# Patient Record
Sex: Male | Born: 1935 | Race: White | Hispanic: No | Marital: Married | Smoking: Never smoker
Health system: Southern US, Community
[De-identification: ages and names within clinical notes are randomized; demographics above are authoritative.]

## PROBLEM LIST (undated history)

## (undated) HISTORY — PX: FINGER SURGERY: SHX640

## (undated) HISTORY — PX: APPENDECTOMY: SHX54

---

## 2019-09-24 ENCOUNTER — Encounter: Payer: Self-pay | Admitting: *Deleted

## 2019-09-24 ENCOUNTER — Ambulatory Visit
Admission: EM | Admit: 2019-09-24 | Discharge: 2019-09-24 | Disposition: A | Discharge status: Home / Self Care | Payer: Medicare Other | Attending: Emergency Medicine | Admitting: Emergency Medicine

## 2019-09-24 ENCOUNTER — Other Ambulatory Visit: Payer: Self-pay

## 2019-09-24 DIAGNOSIS — Z20822 Contact with and (suspected) exposure to covid-19: Secondary | ICD-10-CM

## 2019-09-24 DIAGNOSIS — R0981 Nasal congestion: Secondary | ICD-10-CM

## 2019-09-24 LAB — POC SARS CORONAVIRUS 2 AG -  ED: SARS Coronavirus 2 Ag: NEGATIVE

## 2019-09-24 MED ORDER — FLUTICASONE PROPIONATE 50 MCG/ACT NA SUSP
1.0000 | Freq: Every day | NASAL | 0 refills | Status: AC
Start: 2019-09-24 — End: ?

## 2019-09-24 NOTE — ED Triage Notes (Signed)
C/O sinus burning, nasal congestion, slight cough x 2 days without fever.  Denies malaise.

## 2019-09-24 NOTE — Discharge Instructions (Addendum)
Important to follow-up with your PCP about your elevated blood pressure today. Please go to ER if you develop severe chest pain, difficulty breathing, lightheadedness, nausea, leg swelling. Recommend taking Coricidin as this is a decongestant that will not elevate your blood pressure.

## 2019-09-24 NOTE — ED Provider Notes (Signed)
[facility]    CSN: [id number] Arrival date & time: 09/24/19  0920      History   Chief Complaint Chief Complaint  Patient presents with  . Nasal Congestion  . Cough    HPI Calvin Yates is a 85 y.o. male without significant medical history   Presenting for Covid testing: Exposure: unknown Date of exposure: Thursday at dentist office Any fever, symptoms since exposure: Yes: Sinus congestion, burning, dry cough x2 days.  No fever, chest pain, difficulty breathing. Of note, patient's blood pressure elevated: Denies history thereof.  Has been taking OTC decongestants.  Denies chest pain, shortness of breath, headache, abdominal pain, lower extremity edema.  History reviewed. No pertinent past medical history.  There are no problems to display for this patient.   Past Surgical History:  Procedure Laterality Date  . APPENDECTOMY    . FINGER SURGERY         Home Medications    Prior to Admission medications   Medication Sig Start Date End Date Taking? Authorizing Provider  fluticasone (FLONASE) 50 MCG/ACT nasal spray Place 1 spray into both nostrils daily. 09/24/19   Hall-Potvin, [place], PA-C    Family History Family History  Problem Relation Age of Onset  . Hypertension Mother   . Hypercholesterolemia Mother   . Heart disease Mother     Social History Social History   Tobacco Use  . Smoking status: Never Smoker  . Smokeless tobacco: Never Used  Substance Use Topics  . Alcohol use: Not Currently  . Drug use: Never     Allergies   Amoxicillin   Review of Systems As per HPI   Physical Exam Triage Vital Signs ED Triage Vitals  Enc Vitals Group     BP      Pulse      Resp      Temp      Temp src      SpO2      Weight      Height      Head Circumference      Peak Flow      Pain Score      Pain Loc      Pain Edu?      Excl. in GC?    No data found.  Updated Vital Signs BP (!) 198/100 (BP Location: Right Arm)   Pulse  89   Temp 98.6 F (37 C) (Oral)   Resp (!) 22   SpO2 94%   Visual Acuity Right Eye Distance:   Left Eye Distance:   Bilateral Distance:    Right Eye Near:   Left Eye Near:    Bilateral Near:     Physical Exam Constitutional:      General: He is not in acute distress.    Appearance: He is not toxic-appearing or diaphoretic.  HENT:     Head: Normocephalic and atraumatic.     Mouth/Throat:     Mouth: Mucous membranes are moist.     Pharynx: Oropharynx is clear.  Eyes:     General: No scleral icterus.    Conjunctiva/sclera: Conjunctivae normal.     Pupils: Pupils are equal, round, and reactive to light.  Neck:     Comments: Trachea midline, negative JVD Cardiovascular:     Rate and Rhythm: Normal rate and regular rhythm.  Pulmonary:     Effort: Pulmonary effort is normal. No respiratory distress.     Breath sounds: No wheezing.  Musculoskeletal:  Cervical back: Neck supple. No tenderness.  Lymphadenopathy:     Cervical: No cervical adenopathy.  Skin:    Capillary Refill: Capillary refill takes less than 2 seconds.     Coloration: Skin is not jaundiced or pale.     Findings: No rash.  Neurological:     Mental Status: He is alert and oriented to person, place, and time.      UC Treatments / Results  Labs (all labs ordered are listed, but only abnormal results are displayed) Labs Reviewed  POC SARS CORONAVIRUS 2 AG -  ED - Normal  NOVEL CORONAVIRUS, NAA    EKG   Radiology No results found.  Procedures Procedures (including critical care time)  Medications Ordered in UC Medications - No data to display  Initial Impression / Assessment and Plan / UC Course  I have reviewed the triage vital signs and the nursing notes.  Pertinent labs & imaging results that were available during my care of the patient were reviewed by me and considered in my medical decision making (see chart for details).     Patient afebrile, nontoxic, with SpO2 94%.  Rapid Covid  negative, Covid PCR pending.  Patient to quarantine until results are back.  We will treat supportively as outlined below.  Patient will follow up with PCP regarding elevated blood pressure reading.  Return precautions discussed, patient verbalized understanding and is agreeable to plan. Final Clinical Impressions(s) / UC Diagnoses   Final diagnoses:  Exposure to COVID-19 virus  Nasal congestion     Discharge Instructions     Important to follow-up with your PCP about your elevated blood pressure today. Please go to ER if you develop severe chest pain, difficulty breathing, lightheadedness, nausea, leg swelling. Recommend taking Coricidin as this is a decongestant that will not elevate your blood pressure.    ED Prescriptions    Medication Sig Dispense Auth. Provider   fluticasone (FLONASE) 50 MCG/ACT nasal spray Place 1 spray into both nostrils daily. 16 g Hall-Potvin, [place], PA-C     PDMP not reviewed this encounter.   Hall-Potvin, [place], [place] 09/24/19 1032

## 2019-09-25 LAB — SARS-COV-2, NAA 2 DAY TAT

## 2019-09-25 LAB — NOVEL CORONAVIRUS, NAA: SARS-CoV-2, NAA: NOT DETECTED

## 2020-06-15 ENCOUNTER — Other Ambulatory Visit: Payer: Self-pay

## 2020-06-15 ENCOUNTER — Inpatient Hospital Stay (HOSPITAL_COMMUNITY)
Admission: EM | Admit: 2020-06-15 | Discharge: 2020-07-05 | DRG: 064 | Disposition: A | Discharge status: Hospice | Payer: Medicare Other | Attending: Internal Medicine | Admitting: Internal Medicine

## 2020-06-15 ENCOUNTER — Inpatient Hospital Stay (HOSPITAL_COMMUNITY): Payer: Medicare Other

## 2020-06-15 ENCOUNTER — Emergency Department (HOSPITAL_COMMUNITY): Payer: Medicare Other

## 2020-06-15 DIAGNOSIS — R29720 NIHSS score 20: Secondary | ICD-10-CM | POA: Diagnosis present

## 2020-06-15 DIAGNOSIS — R414 Neurologic neglect syndrome: Secondary | ICD-10-CM | POA: Diagnosis present

## 2020-06-15 DIAGNOSIS — R531 Weakness: Secondary | ICD-10-CM | POA: Diagnosis not present

## 2020-06-15 DIAGNOSIS — G9340 Encephalopathy, unspecified: Secondary | ICD-10-CM | POA: Diagnosis not present

## 2020-06-15 DIAGNOSIS — I619 Nontraumatic intracerebral hemorrhage, unspecified: Secondary | ICD-10-CM | POA: Diagnosis present

## 2020-06-15 DIAGNOSIS — R0902 Hypoxemia: Secondary | ICD-10-CM

## 2020-06-15 DIAGNOSIS — G936 Cerebral edema: Secondary | ICD-10-CM | POA: Diagnosis present

## 2020-06-15 DIAGNOSIS — R509 Fever, unspecified: Secondary | ICD-10-CM

## 2020-06-15 DIAGNOSIS — Z79899 Other long term (current) drug therapy: Secondary | ICD-10-CM

## 2020-06-15 DIAGNOSIS — Z880 Allergy status to penicillin: Secondary | ICD-10-CM

## 2020-06-15 DIAGNOSIS — G8194 Hemiplegia, unspecified affecting left nondominant side: Secondary | ICD-10-CM | POA: Diagnosis present

## 2020-06-15 DIAGNOSIS — N179 Acute kidney failure, unspecified: Secondary | ICD-10-CM | POA: Diagnosis not present

## 2020-06-15 DIAGNOSIS — R4701 Aphasia: Secondary | ICD-10-CM | POA: Diagnosis present

## 2020-06-15 DIAGNOSIS — Z6828 Body mass index (BMI) 28.0-28.9, adult: Secondary | ICD-10-CM

## 2020-06-15 DIAGNOSIS — R609 Edema, unspecified: Secondary | ICD-10-CM | POA: Diagnosis not present

## 2020-06-15 DIAGNOSIS — R069 Unspecified abnormalities of breathing: Secondary | ICD-10-CM

## 2020-06-15 DIAGNOSIS — R29810 Facial weakness: Secondary | ICD-10-CM | POA: Diagnosis present

## 2020-06-15 DIAGNOSIS — I10 Essential (primary) hypertension: Secondary | ICD-10-CM | POA: Diagnosis present

## 2020-06-15 DIAGNOSIS — E663 Overweight: Secondary | ICD-10-CM | POA: Diagnosis present

## 2020-06-15 DIAGNOSIS — J690 Pneumonitis due to inhalation of food and vomit: Secondary | ICD-10-CM | POA: Diagnosis present

## 2020-06-15 DIAGNOSIS — I161 Hypertensive emergency: Secondary | ICD-10-CM | POA: Diagnosis present

## 2020-06-15 DIAGNOSIS — R1310 Dysphagia, unspecified: Secondary | ICD-10-CM | POA: Diagnosis present

## 2020-06-15 DIAGNOSIS — Z515 Encounter for palliative care: Secondary | ICD-10-CM

## 2020-06-15 DIAGNOSIS — J9601 Acute respiratory failure with hypoxia: Secondary | ICD-10-CM | POA: Diagnosis not present

## 2020-06-15 DIAGNOSIS — R739 Hyperglycemia, unspecified: Secondary | ICD-10-CM | POA: Diagnosis not present

## 2020-06-15 DIAGNOSIS — T17908A Unspecified foreign body in respiratory tract, part unspecified causing other injury, initial encounter: Secondary | ICD-10-CM | POA: Diagnosis not present

## 2020-06-15 DIAGNOSIS — Z66 Do not resuscitate: Secondary | ICD-10-CM | POA: Diagnosis not present

## 2020-06-15 DIAGNOSIS — Z20822 Contact with and (suspected) exposure to covid-19: Secondary | ICD-10-CM | POA: Diagnosis present

## 2020-06-15 DIAGNOSIS — E785 Hyperlipidemia, unspecified: Secondary | ICD-10-CM | POA: Diagnosis not present

## 2020-06-15 DIAGNOSIS — M7989 Other specified soft tissue disorders: Secondary | ICD-10-CM | POA: Diagnosis not present

## 2020-06-15 DIAGNOSIS — I680 Cerebral amyloid angiopathy: Secondary | ICD-10-CM | POA: Diagnosis present

## 2020-06-15 DIAGNOSIS — T1490XA Injury, unspecified, initial encounter: Secondary | ICD-10-CM

## 2020-06-15 DIAGNOSIS — I6389 Other cerebral infarction: Secondary | ICD-10-CM | POA: Diagnosis not present

## 2020-06-15 DIAGNOSIS — D6489 Other specified anemias: Secondary | ICD-10-CM | POA: Diagnosis not present

## 2020-06-15 DIAGNOSIS — G9349 Other encephalopathy: Secondary | ICD-10-CM | POA: Diagnosis present

## 2020-06-15 DIAGNOSIS — Z7189 Other specified counseling: Secondary | ICD-10-CM | POA: Diagnosis not present

## 2020-06-15 DIAGNOSIS — Z789 Other specified health status: Secondary | ICD-10-CM

## 2020-06-15 DIAGNOSIS — E854 Organ-limited amyloidosis: Secondary | ICD-10-CM | POA: Diagnosis present

## 2020-06-15 DIAGNOSIS — T17908D Unspecified foreign body in respiratory tract, part unspecified causing other injury, subsequent encounter: Secondary | ICD-10-CM | POA: Diagnosis not present

## 2020-06-15 DIAGNOSIS — Z83438 Family history of other disorder of lipoprotein metabolism and other lipidemia: Secondary | ICD-10-CM

## 2020-06-15 DIAGNOSIS — I611 Nontraumatic intracerebral hemorrhage in hemisphere, cortical: Principal | ICD-10-CM | POA: Diagnosis present

## 2020-06-15 DIAGNOSIS — E870 Hyperosmolality and hypernatremia: Secondary | ICD-10-CM | POA: Diagnosis not present

## 2020-06-15 DIAGNOSIS — A411 Sepsis due to other specified staphylococcus: Secondary | ICD-10-CM | POA: Diagnosis not present

## 2020-06-15 DIAGNOSIS — D72829 Elevated white blood cell count, unspecified: Secondary | ICD-10-CM | POA: Diagnosis not present

## 2020-06-15 DIAGNOSIS — J9602 Acute respiratory failure with hypercapnia: Secondary | ICD-10-CM | POA: Diagnosis not present

## 2020-06-15 DIAGNOSIS — G935 Compression of brain: Secondary | ICD-10-CM | POA: Diagnosis present

## 2020-06-15 DIAGNOSIS — R471 Dysarthria and anarthria: Secondary | ICD-10-CM | POA: Diagnosis present

## 2020-06-15 DIAGNOSIS — I609 Nontraumatic subarachnoid hemorrhage, unspecified: Secondary | ICD-10-CM | POA: Diagnosis present

## 2020-06-15 DIAGNOSIS — B957 Other staphylococcus as the cause of diseases classified elsewhere: Secondary | ICD-10-CM | POA: Diagnosis not present

## 2020-06-15 DIAGNOSIS — I610 Nontraumatic intracerebral hemorrhage in hemisphere, subcortical: Secondary | ICD-10-CM | POA: Diagnosis not present

## 2020-06-15 DIAGNOSIS — R7881 Bacteremia: Secondary | ICD-10-CM | POA: Diagnosis not present

## 2020-06-15 DIAGNOSIS — Z9911 Dependence on respirator [ventilator] status: Secondary | ICD-10-CM | POA: Diagnosis not present

## 2020-06-15 DIAGNOSIS — Z8249 Family history of ischemic heart disease and other diseases of the circulatory system: Secondary | ICD-10-CM

## 2020-06-15 LAB — RAPID URINE DRUG SCREEN, HOSP PERFORMED
Amphetamines: NOT DETECTED
Barbiturates: NOT DETECTED
Benzodiazepines: NOT DETECTED
Cocaine: NOT DETECTED
Opiates: NOT DETECTED
Tetrahydrocannabinol: NOT DETECTED

## 2020-06-15 LAB — DIFFERENTIAL
Abs Immature Granulocytes: 0.06 10*3/uL (ref 0.00–0.07)
Basophils Absolute: 0 10*3/uL (ref 0.0–0.1)
Basophils Relative: 0 %
Eosinophils Absolute: 0.1 10*3/uL (ref 0.0–0.5)
Eosinophils Relative: 1 %
Immature Granulocytes: 1 %
Lymphocytes Relative: 30 %
Lymphs Abs: 3.1 10*3/uL (ref 0.7–4.0)
Monocytes Absolute: 0.9 10*3/uL (ref 0.1–1.0)
Monocytes Relative: 9 %
Neutro Abs: 6.2 10*3/uL (ref 1.7–7.7)
Neutrophils Relative %: 59 %

## 2020-06-15 LAB — COMPREHENSIVE METABOLIC PANEL
ALT: 23 U/L (ref 0–44)
AST: 24 U/L (ref 15–41)
Albumin: 4.1 g/dL (ref 3.5–5.0)
Alkaline Phosphatase: 44 U/L (ref 38–126)
Anion gap: 11 (ref 5–15)
BUN: 18 mg/dL (ref 8–23)
CO2: 22 mmol/L (ref 22–32)
Calcium: 8.9 mg/dL (ref 8.9–10.3)
Chloride: 103 mmol/L (ref 98–111)
Creatinine, Ser: 1.12 mg/dL (ref 0.61–1.24)
GFR, Estimated: >60 mL/min (ref >60)
Glucose, Bld: 141 mg/dL — ABNORMAL HIGH (ref 70–99)
Potassium: 3.9 mmol/L (ref 3.5–5.1)
Sodium: 136 mmol/L (ref 135–145)
Total Bilirubin: 0.8 mg/dL (ref 0.3–1.2)
Total Protein: 7 g/dL (ref 6.5–8.1)

## 2020-06-15 LAB — CBC
HCT: 39.7 % (ref 39.0–52.0)
Hemoglobin: 13 g/dL (ref 13.0–17.0)
MCH: 31.8 pg (ref 26.0–34.0)
MCHC: 32.7 g/dL (ref 30.0–36.0)
MCV: 97.1 fL (ref 80.0–100.0)
Platelets: 228 10*3/uL (ref 150–400)
RBC: 4.09 MIL/uL — ABNORMAL LOW (ref 4.22–5.81)
RDW: 12.2 % (ref 11.5–15.5)
WBC: 10.4 10*3/uL (ref 4.0–10.5)
nRBC: 0 % (ref 0.0–0.2)

## 2020-06-15 LAB — URINALYSIS, ROUTINE W REFLEX MICROSCOPIC
Bacteria, UA: NONE SEEN
Bilirubin Urine: NEGATIVE
Glucose, UA: NEGATIVE mg/dL
Hgb urine dipstick: NEGATIVE
Ketones, ur: NEGATIVE mg/dL
Nitrite: NEGATIVE
Protein, ur: NEGATIVE mg/dL
Specific Gravity, Urine: 1.015 (ref 1.005–1.030)
pH: 6 (ref 5.0–8.0)

## 2020-06-15 LAB — HEMOGLOBIN A1C
Hgb A1c MFr Bld: 5.6 % (ref 4.8–5.6)
Mean Plasma Glucose: 114.02 mg/dL

## 2020-06-15 LAB — RESP PANEL BY RT-PCR (FLU A&B, COVID) ARPGX2
Influenza A by PCR: NEGATIVE
Influenza B by PCR: NEGATIVE
SARS Coronavirus 2 by RT PCR: NEGATIVE

## 2020-06-15 LAB — I-STAT CHEM 8, ED
BUN: 20 mg/dL (ref 8–23)
Calcium, Ion: 1.11 mmol/L — ABNORMAL LOW (ref 1.15–1.40)
Chloride: 104 mmol/L (ref 98–111)
Creatinine, Ser: 1 mg/dL (ref 0.61–1.24)
Glucose, Bld: 137 mg/dL — ABNORMAL HIGH (ref 70–99)
HCT: 40 % (ref 39.0–52.0)
Hemoglobin: 13.6 g/dL (ref 13.0–17.0)
Potassium: 4 mmol/L (ref 3.5–5.1)
Sodium: 139 mmol/L (ref 135–145)
TCO2: 23 mmol/L (ref 22–32)

## 2020-06-15 LAB — APTT: aPTT: 26 seconds (ref 24–36)

## 2020-06-15 LAB — PROTIME-INR
INR: 1 (ref 0.8–1.2)
Prothrombin Time: 13 seconds (ref 11.4–15.2)

## 2020-06-15 LAB — CBG MONITORING, ED: Glucose-Capillary: 129 mg/dL — ABNORMAL HIGH (ref 70–99)

## 2020-06-15 LAB — GLUCOSE, CAPILLARY: Glucose-Capillary: 153 mg/dL — ABNORMAL HIGH (ref 70–99)

## 2020-06-15 MED ORDER — SODIUM CHLORIDE 0.9% FLUSH
3.0000 mL | Freq: Once | INTRAVENOUS | Status: AC
  Administered 2020-06-15: 3 mL via INTRAVENOUS

## 2020-06-15 MED ORDER — CLEVIDIPINE BUTYRATE 0.5 MG/ML IV EMUL
0.0000 mg/h | INTRAVENOUS | Status: DC
  Administered 2020-06-15: 14 mg/h via INTRAVENOUS
  Administered 2020-06-16: 19 mg/h via INTRAVENOUS
  Administered 2020-06-16: 18 mg/h via INTRAVENOUS
  Administered 2020-06-16: 27 mg/h via INTRAVENOUS
  Administered 2020-06-16: 18 mg/h via INTRAVENOUS
  Administered 2020-06-16: 27 mg/h via INTRAVENOUS
  Administered 2020-06-16: 26 mg/h via INTRAVENOUS
  Administered 2020-06-16: 27 mg/h via INTRAVENOUS
  Administered 2020-06-16: 18 mg/h via INTRAVENOUS
  Administered 2020-06-16 (×2): 26 mg/h via INTRAVENOUS
  Administered 2020-06-17: 27 mg/h via INTRAVENOUS
  Administered 2020-06-17 (×2): 26 mg/h via INTRAVENOUS
  Administered 2020-06-17: 27 mg/h via INTRAVENOUS
  Administered 2020-06-17: 32 mg/h via INTRAVENOUS
  Administered 2020-06-17 (×5): 27 mg/h via INTRAVENOUS
  Administered 2020-06-17 – 2020-06-18 (×7): 32 mg/h via INTRAVENOUS
  Administered 2020-06-18: 24 mg/h via INTRAVENOUS
  Filled 2020-06-15: qty 100
  Filled 2020-06-15 (×2): qty 50
  Filled 2020-06-15: qty 100
  Filled 2020-06-15 (×3): qty 50
  Filled 2020-06-15 (×3): qty 100
  Filled 2020-06-15 (×3): qty 50
  Filled 2020-06-15: qty 100
  Filled 2020-06-15 (×3): qty 50
  Filled 2020-06-15 (×2): qty 100
  Filled 2020-06-15 (×3): qty 50
  Filled 2020-06-15: qty 100

## 2020-06-15 MED ORDER — ACETAMINOPHEN 160 MG/5ML PO SOLN
650.0000 mg | ORAL | Status: DC | PRN
  Administered 2020-06-18 – 2020-07-02 (×9): 650 mg
  Filled 2020-06-15 (×8): qty 20.3

## 2020-06-15 MED ORDER — STROKE: EARLY STAGES OF RECOVERY BOOK
Freq: Once | Status: AC
  Filled 2020-06-15: qty 1

## 2020-06-15 MED ORDER — ACETAMINOPHEN 650 MG RE SUPP
650.0000 mg | RECTAL | Status: DC | PRN
  Administered 2020-06-16 – 2020-06-17 (×2): 650 mg via RECTAL
  Filled 2020-06-15 (×2): qty 1

## 2020-06-15 MED ORDER — INSULIN ASPART 100 UNIT/ML ~~LOC~~ SOLN
0.0000 [IU] | SUBCUTANEOUS | Status: DC
  Administered 2020-06-16 – 2020-06-25 (×11): 1 [IU] via SUBCUTANEOUS

## 2020-06-15 MED ORDER — SENNOSIDES-DOCUSATE SODIUM 8.6-50 MG PO TABS
1.0000 | ORAL_TABLET | Freq: Two times a day (BID) | ORAL | Status: DC
  Filled 2020-06-15: qty 1

## 2020-06-15 MED ORDER — PANTOPRAZOLE SODIUM 40 MG IV SOLR
40.0000 mg | Freq: Every day | INTRAVENOUS | Status: DC
  Administered 2020-06-16 – 2020-06-17 (×2): 40 mg via INTRAVENOUS
  Filled 2020-06-15 (×2): qty 40

## 2020-06-15 MED ORDER — CHLORHEXIDINE GLUCONATE CLOTH 2 % EX PADS
6.0000 | MEDICATED_PAD | Freq: Every day | CUTANEOUS | Status: DC
  Administered 2020-06-16 – 2020-07-03 (×18): 6 via TOPICAL

## 2020-06-15 MED ORDER — CLEVIDIPINE BUTYRATE 0.5 MG/ML IV EMUL
INTRAVENOUS | Status: AC
  Administered 2020-06-15: 4 mg/h via INTRAVENOUS
  Filled 2020-06-15: qty 50

## 2020-06-15 MED ORDER — ACETAMINOPHEN 325 MG PO TABS
650.0000 mg | ORAL_TABLET | ORAL | Status: DC | PRN
  Administered 2020-06-24: 650 mg via ORAL
  Filled 2020-06-15 (×2): qty 2

## 2020-06-15 MED ORDER — ONDANSETRON HCL 4 MG/2ML IJ SOLN
4.0000 mg | Freq: Four times a day (QID) | INTRAMUSCULAR | Status: DC | PRN
  Administered 2020-06-16: 4 mg via INTRAVENOUS
  Filled 2020-06-15: qty 2

## 2020-06-15 MED ORDER — LABETALOL HCL 5 MG/ML IV SOLN
10.0000 mg | Freq: Once | INTRAVENOUS | Status: AC
  Administered 2020-06-15: 10 mg via INTRAVENOUS

## 2020-06-15 MED ORDER — CHLORHEXIDINE GLUCONATE 0.12 % MT SOLN
15.0000 mL | Freq: Two times a day (BID) | OROMUCOSAL | Status: DC
  Administered 2020-06-16 – 2020-06-21 (×12): 15 mL via OROMUCOSAL
  Filled 2020-06-15 (×7): qty 15

## 2020-06-15 MED ORDER — GADOBUTROL 1 MMOL/ML IV SOLN
7.5000 mL | Freq: Once | INTRAVENOUS | Status: AC | PRN
  Administered 2020-06-15: 7.5 mL via INTRAVENOUS

## 2020-06-15 NOTE — Consult Note (Addendum)
NAME:  Calvin Yates, MRN:  [id number], DOB:  05-17-1934, LOS: 0 ADMISSION DATE:  06/15/2020, CONSULTATION DATE:  06/15/20 REFERRING MD:  Dr Iver Nestle - neuro, CHIEF COMPLAINT:   - Acute encephalopathy due to ICH  Brief History:  Acute encephalopathy due to ICH  History of Present Illness: Obtained from [facility] hospitalist.  Patient unable to give history  86 year old male with very minimal past medical history.  Ate dinner with his wife at 1730 and having a conversation till 1800 hrs.  Abruptly at 1815 was found to be down.  EMS on arrival found his blood pressure to be high systolic 200s.  He had an episode of emesis on route and transiently hypoxemic.  But in the ER for the 90 minutes prior to CCM evaluation saturation 95% on room air and no further emesis.  He is drowsy but following commands with hemiparesis on the left side.  7 cm right intracranial hemorrhage frontoparietal region hemorrhage without midline shift.  Patient's not intubated.  He has a hard collar on because of his fall.  C-spine CT is pending.  CCM called because of high risk of worsening encephalopathy and respiratory failure  Past Medical History:    has no past medical history on file.   reports that he has never smoked. He has never used smokeless tobacco.  Past Surgical History:  Procedure Laterality Date  . APPENDECTOMY    . FINGER SURGERY      Allergies  Allergen Reactions  . Amoxicillin Rash     There is no immunization history on file for this patient.  Family History  Problem Relation Age of Onset  . Hypertension Mother   . Hypercholesterolemia Mother   . Heart disease Mother      Current Facility-Administered Medications:  .  clevidipine (CLEVIPREX) infusion 0.5 mg/mL, 0-21 mg/hr, Intravenous, Continuous, Bhagat, Srishti L, MD, Last Rate: 8 mL/hr at 06/15/20 1957, 4 mg/hr at 06/15/20 1957  Current Outpatient Medications:  .  fluticasone (FLONASE) 50 MCG/ACT nasal spray, Place 1 spray into  both nostrils daily., Disp: 16 g, Rfl: 0   Significant Hospital Events:  06/15/2020 - admit   Consults:  06/15/2020  - ccm  Procedures:  x  Significant Diagnostic Tests:  06/15/2020 - CT HEAD:  IMPRESSION: 1. Right frontoparietal parenchymal hemorrhage measuring 7.6 cm. Sub-5 mm left temporal hemorrhage. 2. No significant midline shift or ventricular entrapment. 3. Right greater than left cerebral convexity subarachnoid blood products. 4. ASPECTS is 10.  Micro Data:  x  Antimicrobials:  x   Interim History / Subjective:   06/15/2020 - seen in [facility] ER Bed 19  Objective   Blood pressure (!) 142/68, pulse 85, resp. rate (!) 22, weight 81.3 kg, SpO2 93 %.       No intake or output data in the 24 hours ending 06/15/20 2020 Filed Weights   06/15/20 2001  Weight: 81.3 kg    Examination: General: Stable looking male in a hard c-collar lying in the ER bed HENT: Hard collar on left-sided facial weakness when asked to smile Lungs: No distress clear to auscultation Cardiovascular: On Cleviprex normal heart sounds Abdomen: Soft nontender no organomegaly Extremities: Intact able to wiggle his right toes on command Neuro: Sleepy but tries to open his eyes and smile.  Weak on the left side. GU: Not examined  Resolved Hospital Problem list   x  Assessment & Plan:  ASSESSMENT / PLAN:   A:  High risk for acute respiratory  failure requiring intubation due to large intracranial hemorrhage and associated acute encephalopathy  -06/15/2020: Currently protecting airway and no respiratory distress  P:   Closely monitor Intubate if needed    A:   Acute encephalopathy - 7 cm right intracranial hemorrhage at admission 06/15/2020 without midline shift Concern for C Spine injury  P:   Per [facility] neuro hospitalist     A:   Hypertensive associated with intracranial hemorrhage  P:  Blood pressure control per neuro hospitalist   A: At risk for myocardial  infarction  P: Serial troponin Check echo   A: At risk for cardiac arrhythmias QTC normal at admission 06/15/2020   P: Monitor   A:   At risk for aspiration pneumonia following vomiting P:   N.p.o. Check procalcitonin Hold off antibiotics   A:  At risk for AKI P:  Monitor   A:  At risk for electrolyte imbalance P: Potassium goal greater than 4 Magnesium goal greater than 2 Phosphorus goal is normal    A:   Vomiting secondary to intracranial hemorrhage x1 admission  P:   Closely monitor Zofran as needed N.p.o.   A:  At risk for anemia critical illness   P:  - PRBC for hgb </= 6.9gm%    - exceptions are   -  if ACS susepcted/confirmed then transfuse for hgb </= 8.0gm%,  or    -  active bleeding with hemodynamic instability, then transfuse regardless of hemoglobin value   At at all times try to transfuse 1 unit prbc as possible with exception of active hemorrhage   A At risk thrombocytopenia  P SCDs in the setting of intracranial hemorrhage   A:   At risk for hypo and hyperglycemia P:   SSI   Best practice (evaluated daily)  Diet: N.p.o. due to aspiration risk Pain/Anxiety/Delirium protocol (if indicated): Currently none VAP protocol (if indicated): According to neuro hospitalist DVT prophylaxis: SCDs GI prophylaxis: PPI Glucose control: SSI Mobility: Bedrest Disposition: Move from ER to 4 N. neuro ICU  Goals of Care:    Family Updates: Per hospitalist neuro  D/w dr Iver Nestle   ATTESTATION & SIGNATURE   The patient Kairyn Olmeda is critically ill with multiple organ systems failure and requires high complexity decision making for assessment and support, frequent evaluation and titration of therapies, application of advanced monitoring technologies and extensive interpretation of multiple databases.   Critical Care Time devoted to patient care services described in this note is  45  Minutes. This time reflects time of care of  this signee Dr Kalman Shan. This critical care time does not reflect procedure time, or teaching time or supervisory time of PA/NP/Med student/Med Resident etc but could involve care discussion time     Dr. Kalman Shan, M.D., [facility].C.P Pulmonary and Critical Care Medicine Staff Physician [facility] Pager: [phone], If no answer or between  15:00h - 7:00h: call [phone]  06/15/2020 8:20 PM    LABS    PULMONARY Recent Labs  Lab 06/15/20 1937  TCO2 23    CBC Recent Labs  Lab 06/15/20 1934 06/15/20 1937  HGB 13.0 13.6  HCT 39.7 40.0  WBC 10.4  --   PLT 228  --     COAGULATION Recent Labs  Lab 06/15/20 1934  INR 1.0    CARDIAC  No results for input(s): TROPONINI in the last 168 hours. No results for input(s): PROBNP in the  last 168 hours.   CHEMISTRY Recent Labs  Lab 06/15/20 1934 06/15/20 1937  NA 136 139  K 3.9 4.0  CL 103 104  CO2 22  --   GLUCOSE 141* 137*  BUN 18 20  CREATININE 1.12 1.00  CALCIUM 8.9  --    CrCl cannot be calculated (Unknown ideal weight.).   LIVER Recent Labs  Lab 06/15/20 1934  AST 24  ALT 23  ALKPHOS 44  BILITOT 0.8  PROT 7.0  ALBUMIN 4.1  INR 1.0     INFECTIOUS No results for input(s): LATICACIDVEN, PROCALCITON in the last 168 hours.   ENDOCRINE CBG (last 3)  Recent Labs    06/15/20 1931  GLUCAP 129*         IMAGING x48h  - image(s) personally visualized  -   highlighted in bold CT HEAD CODE STROKE WO CONTRAST  Result Date: 06/15/2020 CLINICAL DATA:  Code stroke.  Neuro deficit, acute, stroke suspected EXAM: CT HEAD WITHOUT CONTRAST TECHNIQUE: Contiguous axial images were obtained from the base of the skull through the vertex without intravenous contrast. COMPARISON:  None. FINDINGS: Brain: Right frontoparietal intraparenchymal hemorrhage measuring 7.6 x 4.7 x 3.9 cm with peripheral vasogenic edema. Sub 5 mm left temporal hemorrhage.  Partial effacement of the right lateral ventricle. No evidence of entrapment. Subarachnoid blood products also opacify the right greater than left sulci. No significant midline shift. Vascular: No hyperdense vessel or unexpected calcification. Bilateral carotid siphon atherosclerotic calcifications. Skull: Negative for fracture or focal lesion. Sinuses/Orbits: Normal orbits. Mild paranasal sinus mucosal thickening. No mastoid effusion. Other: None. ASPECTS [facility] Stroke Program Early CT Score) - Ganglionic level infarction (caudate, lentiform nuclei, internal capsule, insula, M1-M3 cortex): 7 - Supraganglionic infarction (M4-M6 cortex): 3 Total score (0-10 with 10 being normal): 10 IMPRESSION: 1. Right frontoparietal parenchymal hemorrhage measuring 7.6 cm. Sub-5 mm left temporal hemorrhage. 2. No significant midline shift or ventricular entrapment. 3. Right greater than left cerebral convexity subarachnoid blood products. 4. ASPECTS is 10. Code stroke imaging results were communicated on 06/15/2020 at 7:50 pm to provider Bhagat via secure text paging. Electronically Signed   By: Stana Bunting M.D.   On: 06/15/2020 19:53

## 2020-06-15 NOTE — ED Notes (Signed)
This RN accompany pt to MRI

## 2020-06-15 NOTE — Consult Note (Signed)
Neurology Consultation Reason for Consult: Left sided weakness  Requesting Physician: Frederick Peers  CC: Left sided weakness  History is obtained from: Patient, family and chart review   HPI: Calvin Yates is a 86 y.o. male with past medical history significant for cataracts, not on any medications, in good health per family.  He ate dinner with his wife at 5:30 PM and then was sitting and eating in newspaper.  They were talking back-and-forth although he was in another room.  He stopped answering her and she found him down at about 6:15 PM, estimating that he was last talking to her at about 6 PM.  EMS was activated and noted the patient's blood pressures were in the 200s over 100s, blood glucose was 122, tachycardic, and he had an episode of emesis on route with some hypoxia to the high 80s for which he was placed on nonrebreather with improvement of his saturations.  On arrival he was found to have an ICH  LKW: 6 PM tPA given?: No, due to ICH  Premorbid modified rankin scale:      0 - No symptoms.  ICH Score: 2  Time performed: 7:45 PM GCS: 13-15 is 0 points   Following commands (6), opens eyes to voice (3), answers some questions appropriately (4) Infratentorial: No.. If yes, 1 point -- 0 Volume: >30cc is 1 point  Age: 86 y.o.. >80 is 1 point Intraventricular extension is 1 point -- 0  A Score of 2 points has a 30 day mortality of 26%. Stroke. 2001 Apr;32(4):891-7.  ROS: Unable to obtain due to altered mental status, family denies any recent complaints  No past medical history on file. No significant PMHx, follows with opthalmology  Family History  Problem Relation Age of Onset  . Hypertension Mother   . Hypercholesterolemia Mother   . Heart disease Mother    Past Surgical History:  Procedure Laterality Date  . APPENDECTOMY    . FINGER SURGERY     Current Outpatient Medications  Medication Instructions  . fluticasone (FLONASE) 50 MCG/ACT nasal spray 1 spray, Each  Nare, Daily   Social History:  reports that he has never smoked. He has never used smokeless tobacco. He reports previous alcohol use. He reports that he does not use drugs.  Exam: Current vital signs: There were no vitals taken for this visit. Vital signs in last 24 hours: BP: ()/()  Arterial Line BP: ()/()    Physical Exam  Constitutional: Appears well-developed and well-nourished.  Psych: Affect appropriate to situation, flat but cooperative Eyes: No scleral injection HENT: No oropharyngeal obstruction, c-collar in place.  MSK: no joint deformities.  Cardiovascular: Normal rate to slight tachycardia and regular rhythm.  Respiratory: Rhonchorous breath sounds GI: Soft.  No distension. There is no tenderness.  Skin: Warm dry and intact visible skin  Neuro: Mental Status: Patient is drowsy but opens eyes to voice, oriented to person, month, and follows simple commands No signs of aphasia or neglect Cranial Nerves: II: Visual Fields are notable for a left hemianopia. Pupils are equal, round, and reactive to light.  5 to 2 mm bilaterally III,IV, VI: Right gaze preference, can track to midline V: Facial sensation is slightly reduced to eyelash brush on the left VII: Facial movement is notable for a left facial droop.  VIII: hearing is intact to voice X: Uvula difficult to visualize XI: Shoulder shrug is symmetric. XII: tongue is midline without atrophy or fasciculations.  Motor: Tone is increased in the left  upper extremity and lower extremity. Bulk is normal.  He moves the right side freely antigravity, at least 4/5.  On the left side he was initially flaccid, then extensor posturing in the left upper extremity and triple flexion in the left lower extremity Sensory: Sensation appears reduced in the left arm and leg Deep Tendon Reflexes: 2+ and symmetric in the biceps and patellae. Plantars: Toes are upgoing on the left, down on the right Cerebellar: Finger-to-nose and toe  to hand is intact on the right  NIHSS total 20 Score breakdown:  1 point for drowsiness, 1 point for gaze preference to the right, 2 points for left hemianopia, 2 points for left facial droop, 3 points for left arm weakness, 1 point for right arm weakness, 1 point for right leg weakness, 3 points for left leg weakness, 2 points for sensation loss on the left, 2 points for severe dysarthria, 2 points for neglect of the left  I have reviewed labs in epic and the results pertinent to this consultation are: Glucose 137, creatinine 1  I have reviewed the images obtained: Head CT with a large (greater than 30 cc, 7.6 x 4.7 largest axial dimensions) intraparenchymal hemorrhage of the right insula with surrounding edema and mass-effect on the lateral ventricle without intraventricular extension.  There is also a small left temporal punctate hyperdensity concerning for hemorrhage and subarachnoid blood bilaterally, right greater than left  Assessment: This is an 86 year old male with no significant past medical history presenting with multifocal intracerebral hemorrhage, largest in the right MCA territory with associated right MCA syndrome on examination.  Given his drowsiness and emesis as well as the size of the hemorrhage I am concerned about his ability to protect his airway  Plan: Cortical ICH, nontraumatic  Acuity: Acute Laterality: right  Current suspected etiology: Possibly hypertensive though atypical location, possibly secondary conversion of an underlying mass (may explain multifocal nature), possibly CAA though family does not report any cognitive decline at this time, possibly hemorrhagic conversion of an ischemic stroke Treatment: -Admit to ICU -BP control goal SYS < 140, cleverprex and nicard -NSGY Consult for potential intervention if patient decompensates further -PT/OT/ST  -neuromonitoring  CNS Cerebral edema Compression of brain -Hyperosmolar therapy  -NSGY consult  -Close  neuro monitoring  Dysarthria Dysphagia following ICH  -NPO until cleared by speech -ST -Advance diet as tolerated -May need PEG  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 affecting right non-dominant side  -Continue PT/OT/ST  RESP Emesis and concern for aspiration -High risk for needing intubation, CCM on board -Continue to monitor  CV Hypertensive Emergency Hypertensive Urgency -Aggressive BP control,  as above  EKG changes, diffuse ST depressions ~1 mm or less -TTE -Continue BB -Appreciate CCM management, no heparin or antiplatelets given ICH  NSTEMI -Cards Consult  GI/GU  Cr 1.0, unknown baseline -Gentle hydration -avoid nephrotoxic agents  HEME No active issues -Monitor -transfuse for hgb < 7  ENDO No known issues  -SSI, q4hr POC glucose -goal HgbA1c < 7, follow-up labs  Fluid/Electrolyte Disorders No active issues  -Trend  ID Possible Aspiration PNA -CXR -NPO -Monitor  Possible UTI -UA pending   Nutrition  Prophylaxis DVT: SCDs GI: Pantoprazole Bowel: PRN laxatives  Dispo: Pending stabilization Diet: NPO until cleared by speech  Code Status: Full Code status confirmed with family on arrival  [place] MD-PhD [facility] [phone] Available 7 PM to 7 AM, outside of these hours please call Neurologist on call as listed on Amion.  Total  critical care time: 55 minutes  Critical care time was exclusive of separately billable procedures and treating other patients. Critical care was necessary to treat or prevent imminent or life-threatening deterioration. Critical care was time spent personally by me on the following activities: development of treatment plan with patient and/or surrogate as well as nursing, discussions with consultants/primary team, evaluation of patient's response to treatment, examination of patient, obtaining history from patient or surrogate, ordering and performing  treatments and interventions, ordering and review of laboratory studies, ordering and review of radiographic studies, and re-evaluation of patient's condition as needed, as documented above.

## 2020-06-15 NOTE — ED Provider Notes (Signed)
[facility]ENC[facility] Provider Note   CSN: [id number] Arrival date & time: 06/15/20  1929     History Chief Complaint  Patient presents with  . Code Stroke    Calvin Yates is a 86 y.o. male with no significant history who presents to the ED via EMS for Code Stroke. LKN at approximately 1730 today. Wife had been talking with him while she was in another room when he stopped responding. She then found him down at 1800 and called EMS. EMS noted patient to have slurred speech and L-sided weakness. EMS reported SBP 200s, glucose 122, tachycardic, and single episode of emesis during transport with O2 desat to high 80s that improved after placed on NRB. Upon arrival, patient able to follow commands but slurring works and notable L-sided weakness. No anticoagulation.  The history is provided by the patient, medical records and the EMS personnel.  Neurologic Problem This is a new problem. The current episode started 1 to 2 hours ago. The problem occurs constantly. The problem has not changed since onset.Pertinent negatives include no chest pain, no abdominal pain, no headaches and no shortness of breath. Nothing aggravates the symptoms. Nothing relieves the symptoms. He has tried nothing for the symptoms.       No past medical history on file.  Patient Active Problem List   Diagnosis Date Noted  . ICH (intracerebral hemorrhage) (HCC) 06/15/2020    Past Surgical History:  Procedure Laterality Date  . APPENDECTOMY    . FINGER SURGERY         Family History  Problem Relation Age of Onset  . Hypertension Mother   . Hypercholesterolemia Mother   . Heart disease Mother     Social History   Tobacco Use  . Smoking status: Never Smoker  . Smokeless tobacco: Never Used  Vaping Use  . Vaping Use: Never used  Substance Use Topics  . Alcohol use: Not Currently  . Drug use: Never    Home Medications Prior to Admission medications   Medication Sig  Start Date End Date Taking? Authorizing Provider  acetaminophen (TYLENOL) 500 MG tablet Take 500 mg by mouth in the morning.   Yes [provider]  fluticasone (FLONASE) 50 MCG/ACT nasal spray Place 1 spray into both nostrils daily. 09/24/19   Hall-Potvin, [place], PA-C    Allergies    Amoxicillin  Review of Systems   Review of Systems  Unable to perform ROS: Acuity of condition  Respiratory: Negative for shortness of breath.   Cardiovascular: Negative for chest pain.  Gastrointestinal: Negative for abdominal pain.  Neurological: Positive for speech difficulty and weakness. Negative for headaches.    Physical Exam Updated Vital Signs BP (!) 164/66   Pulse (!) 115   Temp (!) 97.4 F (36.3 C) (Oral)   Resp 20   Wt 81.3 kg   SpO2 95%   Physical Exam Vitals and nursing note reviewed.  Constitutional:      General: He is awake. He is not in acute distress.    Appearance: He is well-developed and normal weight. He is ill-appearing.     Interventions: Cervical collar and face mask in place.  HENT:     Head: Normocephalic and atraumatic.     Right Ear: External ear normal.     Left Ear: External ear normal.     Nose: Nose normal.     Mouth/Throat:     Mouth: Mucous membranes are moist.     Pharynx: Oropharynx  is clear. No oropharyngeal exudate or posterior oropharyngeal erythema.  Eyes:     General: No scleral icterus.       Right eye: No discharge.        Left eye: No discharge.     Extraocular Movements: Extraocular movements intact.     Conjunctiva/sclera: Conjunctivae normal.     Pupils: Pupils are equal, round, and reactive to light.  Neck:     Vascular: No JVD.  Cardiovascular:     Rate and Rhythm: Regular rhythm. Tachycardia present.     Pulses: Normal pulses.     Heart sounds: Normal heart sounds.  Pulmonary:     Effort: Pulmonary effort is normal. No respiratory distress.     Breath sounds: Normal breath sounds. No wheezing, rhonchi or rales.   Abdominal:     General: Abdomen is flat. There is no distension.     Palpations: Abdomen is soft.     Tenderness: There is no abdominal tenderness. There is no guarding or rebound.  Musculoskeletal:        General: No signs of injury.     Right lower leg: No edema.     Left lower leg: No edema.  Skin:    General: Skin is warm and dry.     Findings: No rash.  Neurological:     Mental Status: He is alert. He is confused.     GCS: GCS eye subscore is 3. GCS verbal subscore is 4. GCS motor subscore is 6.     Cranial Nerves: Cranial nerve deficit, dysarthria and facial asymmetry present.     Sensory: Sensory deficit present.     Motor: Weakness present.     Comments: L facial droop with slurred speech. Unable to hold up LUE or LLE. Sensation reduced to L-sided extremities. Normal FNF on R.  Psychiatric:        Behavior: Behavior is cooperative.     ED Results / Procedures / Treatments   Labs (all labs ordered are listed, but only abnormal results are displayed) Labs Reviewed  CBC - Abnormal; Notable for the following components:      Result Value   RBC 4.09 (*)    All other components within normal limits  COMPREHENSIVE METABOLIC PANEL - Abnormal; Notable for the following components:   Glucose, Bld 141 (*)    All other components within normal limits  URINALYSIS, ROUTINE W REFLEX MICROSCOPIC - Abnormal; Notable for the following components:   Color, Urine AMBER (*)    APPearance HAZY (*)    Leukocytes,Ua TRACE (*)    All other components within normal limits  CBG MONITORING, ED - Abnormal; Notable for the following components:   Glucose-Capillary 129 (*)    All other components within normal limits  I-STAT CHEM 8, ED - Abnormal; Notable for the following components:   Glucose, Bld 137 (*)    Calcium, Ion 1.11 (*)    All other components within normal limits  RESP PANEL BY RT-PCR (FLU A&B, COVID) ARPGX2  PROTIME-INR  APTT  DIFFERENTIAL  RAPID URINE DRUG SCREEN, HOSP  PERFORMED  HEMOGLOBIN A1C  LIPID PANEL  SEDIMENTATION RATE  CBC WITH DIFFERENTIAL/PLATELET  BASIC METABOLIC PANEL  MAGNESIUM  PHOSPHORUS  HEPATIC FUNCTION PANEL  LACTIC ACID, PLASMA  PROCALCITONIN  CBG MONITORING, ED  TROPONIN I (HIGH SENSITIVITY)    EKG EKG Interpretation  Date/Time:  Friday June 15 2020 19:49:28 EST Ventricular Rate:  88 PR Interval:    QRS Duration: 121 QT Interval:  383 QTC Calculation: 464 R Axis:   -2 Text Interpretation: Sinus rhythm Nonspecific intraventricular conduction delay Borderline ST depression, diffuse leads No previous ECGs available inferior ST depressions Confirmed by Frederick Peers [phone]) on 06/15/2020 7:54:52 PM   Radiology DG CHEST PORT 1 VIEW  Result Date: 06/15/2020 CLINICAL DATA:  Unresponsive. EXAM: PORTABLE CHEST 1 VIEW COMPARISON:  None. FINDINGS: The heart size and mediastinal contours are within normal limits. Both lungs are clear. The visualized skeletal structures are unremarkable. IMPRESSION: No active disease. Electronically Signed   By: Lupita Raider M.D.   On: 06/15/2020 20:29   CT HEAD CODE STROKE WO CONTRAST  Result Date: 06/15/2020 CLINICAL DATA:  Code stroke.  Neuro deficit, acute, stroke suspected EXAM: CT HEAD WITHOUT CONTRAST TECHNIQUE: Contiguous axial images were obtained from the base of the skull through the vertex without intravenous contrast. COMPARISON:  None. FINDINGS: Brain: Right frontoparietal intraparenchymal hemorrhage measuring 7.6 x 4.7 x 3.9 cm with peripheral vasogenic edema. Sub 5 mm left temporal hemorrhage. Partial effacement of the right lateral ventricle. No evidence of entrapment. Subarachnoid blood products also opacify the right greater than left sulci. No significant midline shift. Vascular: No hyperdense vessel or unexpected calcification. Bilateral carotid siphon atherosclerotic calcifications. Skull: Negative for fracture or focal lesion. Sinuses/Orbits: Normal orbits. Mild paranasal  sinus mucosal thickening. No mastoid effusion. Other: None. ASPECTS [facility] Stroke Program Early CT Score) - Ganglionic level infarction (caudate, lentiform nuclei, internal capsule, insula, M1-M3 cortex): 7 - Supraganglionic infarction (M4-M6 cortex): 3 Total score (0-10 with 10 being normal): 10 IMPRESSION: 1. Right frontoparietal parenchymal hemorrhage measuring 7.6 cm. Sub-5 mm left temporal hemorrhage. 2. No significant midline shift or ventricular entrapment. 3. Right greater than left cerebral convexity subarachnoid blood products. 4. ASPECTS is 10. Code stroke imaging results were communicated on 06/15/2020 at 7:50 pm to provider Bhagat via secure text paging. Electronically Signed   By: Stana Bunting M.D.   On: 06/15/2020 19:53    Procedures Procedures  Medications Ordered in ED Medications  clevidipine (CLEVIPREX) infusion 0.5 mg/mL (17.5 mg/hr Intravenous Rate/Dose Change 06/15/20 2223)   stroke: mapping our early stages of recovery book (has no administration in time range)  acetaminophen (TYLENOL) tablet 650 mg (has no administration in time range)    Or  acetaminophen (TYLENOL) 160 MG/5ML solution 650 mg (has no administration in time range)    Or  acetaminophen (TYLENOL) suppository 650 mg (has no administration in time range)  senna-docusate (Senokot-S) tablet 1 tablet (has no administration in time range)  pantoprazole (PROTONIX) injection 40 mg (has no administration in time range)  insulin aspart (novoLOG) injection 0-6 Units (has no administration in time range)  ondansetron (ZOFRAN) injection 4 mg (has no administration in time range)  sodium chloride flush (NS) 0.9 % injection 3 mL (3 mLs Intravenous Given 06/15/20 1955)  labetalol (NORMODYNE) injection 10 mg (10 mg Intravenous Given 06/15/20 1950)    ED Course  I have reviewed the triage vital signs and the nursing notes.  Pertinent labs & imaging results that were available during my care of the patient were  reviewed by me and considered in my medical decision making (see chart for details).    MDM Rules/Calculators/A&P                          Patient is a 62yoM with history and physical as described above who presents to the ED for Code Stroke. Upon arrival, Neurology present and  performed rapid initial neuro assessment with pertinent findings as described above. ABCs intact, following commands, and cleared to go straight to CT scanner. Glucose 129. Code stroke imaging obtained which demonstrated R frontoparietal IPH with no significant midline shift. Neurology reviewed imaging and will admit to their service for further evaluation and management. Patient started on clevidipine for BP control. Initial ECG demonstrated NSR with STD in leads II, III, aVF, V4, V5, and V6 without previous ECG for comparison. Neurology to discuss with Cardiology. Patient otherwise remained HDS with no acute events while under my care.  Final Clinical Impression(s) / ED Diagnoses Final diagnoses:  Left-sided weakness  Intraparenchymal hemorrhage of brain [facility])    Rx / DC Orders ED Discharge Orders    None       Tonia Brooms, MD 06/15/20 2227    Clarene Duke Ambrose Finland, MD 06/18/20 [phone]

## 2020-06-15 NOTE — ED Triage Notes (Signed)
Assume care from EMS, EMS reports pt was found on the floor by wife somewhat unresponsive .EMS reports LKW was 1800. Upon EMS arrival pt presents with left sided weakness, right side gaze deviation and slurr speech   203/106 cbg 122

## 2020-06-16 ENCOUNTER — Inpatient Hospital Stay (HOSPITAL_COMMUNITY): Payer: Medicare Other

## 2020-06-16 DIAGNOSIS — I680 Cerebral amyloid angiopathy: Secondary | ICD-10-CM

## 2020-06-16 DIAGNOSIS — I611 Nontraumatic intracerebral hemorrhage in hemisphere, cortical: Secondary | ICD-10-CM | POA: Diagnosis not present

## 2020-06-16 DIAGNOSIS — R531 Weakness: Secondary | ICD-10-CM

## 2020-06-16 DIAGNOSIS — I10 Essential (primary) hypertension: Secondary | ICD-10-CM | POA: Diagnosis not present

## 2020-06-16 DIAGNOSIS — I619 Nontraumatic intracerebral hemorrhage, unspecified: Secondary | ICD-10-CM | POA: Diagnosis not present

## 2020-06-16 DIAGNOSIS — I161 Hypertensive emergency: Secondary | ICD-10-CM

## 2020-06-16 LAB — LIPID PANEL
Cholesterol: 195 mg/dL (ref 0–200)
HDL: 30 mg/dL — ABNORMAL LOW (ref >40)
LDL Cholesterol: 103 mg/dL — ABNORMAL HIGH (ref 0–99)
Total CHOL/HDL Ratio: 6.5 RATIO
Triglycerides: 312 mg/dL — ABNORMAL HIGH (ref <150)
VLDL: 62 mg/dL — ABNORMAL HIGH (ref 0–40)

## 2020-06-16 LAB — URINALYSIS, ROUTINE W REFLEX MICROSCOPIC
Bacteria, UA: NONE SEEN
Bilirubin Urine: NEGATIVE
Glucose, UA: NEGATIVE mg/dL
Ketones, ur: NEGATIVE mg/dL
Leukocytes,Ua: NEGATIVE
Nitrite: NEGATIVE
Protein, ur: NEGATIVE mg/dL
Specific Gravity, Urine: 1.025 (ref 1.005–1.030)
pH: 6 (ref 5.0–8.0)

## 2020-06-16 LAB — HEPATIC FUNCTION PANEL
ALT: 23 U/L (ref 0–44)
AST: 30 U/L (ref 15–41)
Albumin: 4 g/dL (ref 3.5–5.0)
Alkaline Phosphatase: 76 U/L (ref 38–126)
Bilirubin, Direct: 0.1 mg/dL (ref 0.0–0.2)
Indirect Bilirubin: 0.3 mg/dL (ref 0.3–0.9)
Total Bilirubin: 0.4 mg/dL (ref 0.3–1.2)
Total Protein: 7 g/dL (ref 6.5–8.1)

## 2020-06-16 LAB — CBC WITH DIFFERENTIAL/PLATELET
Abs Immature Granulocytes: 0.08 10*3/uL — ABNORMAL HIGH (ref 0.00–0.07)
Basophils Absolute: 0 10*3/uL (ref 0.0–0.1)
Basophils Relative: 0 %
Eosinophils Absolute: 0 10*3/uL (ref 0.0–0.5)
Eosinophils Relative: 0 %
HCT: 38 % — ABNORMAL LOW (ref 39.0–52.0)
Hemoglobin: 13.2 g/dL (ref 13.0–17.0)
Immature Granulocytes: 0 %
Lymphocytes Relative: 7 %
Lymphs Abs: 1.3 10*3/uL (ref 0.7–4.0)
MCH: 32.8 pg (ref 26.0–34.0)
MCHC: 34.7 g/dL (ref 30.0–36.0)
MCV: 94.5 fL (ref 80.0–100.0)
Monocytes Absolute: 0.9 10*3/uL (ref 0.1–1.0)
Monocytes Relative: 5 %
Neutro Abs: 17.4 10*3/uL — ABNORMAL HIGH (ref 1.7–7.7)
Neutrophils Relative %: 88 %
Platelets: 239 10*3/uL (ref 150–400)
RBC: 4.02 MIL/uL — ABNORMAL LOW (ref 4.22–5.81)
RDW: 12.2 % (ref 11.5–15.5)
WBC: 19.7 10*3/uL — ABNORMAL HIGH (ref 4.0–10.5)
nRBC: 0 % (ref 0.0–0.2)

## 2020-06-16 LAB — PHOSPHORUS: Phosphorus: 2.8 mg/dL (ref 2.5–4.6)

## 2020-06-16 LAB — BASIC METABOLIC PANEL
Anion gap: 9 (ref 5–15)
BUN: 20 mg/dL (ref 8–23)
CO2: 22 mmol/L (ref 22–32)
Calcium: 8.7 mg/dL — ABNORMAL LOW (ref 8.9–10.3)
Chloride: 103 mmol/L (ref 98–111)
Creatinine, Ser: 1.08 mg/dL (ref 0.61–1.24)
GFR, Estimated: >60 mL/min (ref >60)
Glucose, Bld: 164 mg/dL — ABNORMAL HIGH (ref 70–99)
Potassium: 4 mmol/L (ref 3.5–5.1)
Sodium: 134 mmol/L — ABNORMAL LOW (ref 135–145)

## 2020-06-16 LAB — GLUCOSE, CAPILLARY
Glucose-Capillary: 109 mg/dL — ABNORMAL HIGH (ref 70–99)
Glucose-Capillary: 132 mg/dL — ABNORMAL HIGH (ref 70–99)
Glucose-Capillary: 134 mg/dL — ABNORMAL HIGH (ref 70–99)
Glucose-Capillary: 140 mg/dL — ABNORMAL HIGH (ref 70–99)
Glucose-Capillary: 146 mg/dL — ABNORMAL HIGH (ref 70–99)
Glucose-Capillary: 162 mg/dL — ABNORMAL HIGH (ref 70–99)

## 2020-06-16 LAB — MAGNESIUM: Magnesium: 2.3 mg/dL (ref 1.7–2.4)

## 2020-06-16 LAB — SEDIMENTATION RATE: Sed Rate: 5 mm/hr (ref 0–16)

## 2020-06-16 LAB — TROPONIN I (HIGH SENSITIVITY): Troponin I (High Sensitivity): 30 ng/L — ABNORMAL HIGH (ref <18)

## 2020-06-16 LAB — MRSA PCR SCREENING: MRSA by PCR: NEGATIVE

## 2020-06-16 LAB — PROCALCITONIN: Procalcitonin: <0.1 ng/mL

## 2020-06-16 LAB — LACTIC ACID, PLASMA: Lactic Acid, Venous: 1.9 mmol/L (ref 0.5–1.9)

## 2020-06-16 MED ORDER — FENTANYL CITRATE (PF) 100 MCG/2ML IJ SOLN
75.0000 ug | INTRAMUSCULAR | Status: DC | PRN
Start: 2020-06-16 — End: 2020-06-22
  Administered 2020-06-16 – 2020-06-19 (×10): 75 ug via INTRAVENOUS
  Filled 2020-06-16 (×10): qty 2

## 2020-06-16 MED ORDER — IOHEXOL 350 MG/ML SOLN
75.0000 mL | Freq: Once | INTRAVENOUS | Status: AC | PRN
  Administered 2020-06-16: 75 mL via INTRAVENOUS

## 2020-06-16 NOTE — Plan of Care (Signed)
  Problem: Clinical Measurements: Goal: Ability to maintain clinical measurements within normal limits will improve Outcome: Progressing   

## 2020-06-16 NOTE — Evaluation (Signed)
Clinical/Bedside Swallow Evaluation Patient Details  Name: Calvin Yates MRN: [id number] Date of Birth: 05-18-1934  Today's Date: 06/16/2020 Time: SLP Start Time (ACUTE ONLY): 1323 SLP Stop Time (ACUTE ONLY): 1340 SLP Time Calculation (min) (ACUTE ONLY): 17 min  Past Medical History: No past medical history on file. Past Surgical History:  Past Surgical History:  Procedure Laterality Date  . APPENDECTOMY    . FINGER SURGERY     HPI:  Calvin Yates is a 86 y.o. male with no significant history who presents to the ED via EMS for Code Stroke.  EMS noted patient to have slurred speech and L-sided weakness. Brain MRI reported right frontoparietal parenchymal hemorrhage and left temporal hemorrhage.   Assessment / Plan / Recommendation Clinical Impression  Pt was seen for a bedside swallow evaluation in the setting of bilateral hemorrhages.  Pt was encountered asleep in bed and he remained lethargic throughout this evaluation; therefore evaluation was limited.  Daughter was at bedside and she reported that the pt was fully independent prior to this admission and that he helped to care for his wife, who has suspected dementia per daughter report.  She denied any hx of dysphagia.  Pt was seen with a small ice chip trial x1.  He passively accepted the bolus into his oral cavity and exhibited labial and lingual movements in response to the bolus, but swallow initiation was not observed (likely secondary to lethargy).  Bolus was suctioned and oral care was completed.  During oral care, pt was observed to have missing dentition, which daughter confirmed.  Recommend continuation of NPO at this time with consideration for short-term alternative means of nutrition.  SLP will f/u for PO trials and cognitive-linguistic evaluation as level of alertness improves.  SLP Visit Diagnosis: Dysphagia, unspecified (R13.10)    Aspiration Risk  Moderate aspiration risk    Diet Recommendation Alternative means -  temporary;NPO   Medication Administration: Via alternative means    Other  Recommendations Oral Care Recommendations: Oral care QID;Staff/trained caregiver to provide oral care Other Recommendations: Have oral suction available   Follow up Recommendations Other (comment) (TBD)      Frequency and Duration min 2x/week  2 weeks       Prognosis Prognosis for Safe Diet Advancement: Fair      Swallow Study   General HPI: Calvin Yates is a 86 y.o. male with no significant history who presents to the ED via EMS for Code Stroke.  EMS noted patient to have slurred speech and L-sided weakness. Brain MRI reported right frontoparietal parenchymal hemorrhage and left temporal hemorrhage. Type of Study: Bedside Swallow Evaluation Previous Swallow Assessment: None Diet Prior to this Study: NPO Temperature Spikes Noted: Yes Respiratory Status: Room air History of Recent Intubation: No Behavior/Cognition: Lethargic/Drowsy Oral Care Completed by SLP: Yes Oral Cavity - Dentition: Missing dentition Patient Positioning: Upright in bed Baseline Vocal Quality: Not observed    Oral/Motor/Sensory Function Overall Oral Motor/Sensory Function: Other (comment) (Unable to assess)   Ice Chips Ice chips: Impaired Presentation: Spoon Oral Phase Impairments: Poor awareness of bolus Oral Phase Functional Implications: Oral holding   Thin Liquid Thin Liquid: Not tested    Nectar Thick Nectar Thick Liquid: Not tested   Honey Thick Honey Thick Liquid: Not tested   Puree Puree: Not tested   Solid     Solid: Not tested     Villa Herb M.S., CCC-SLP Acute Rehabilitation Services Office: [phone]  Shanon Rosser Aalivia Mcgraw 06/16/2020,1:50 PM

## 2020-06-16 NOTE — Progress Notes (Signed)
PT Cancellation Note  Patient Details Name: Israel Werts MRN: [id number] DOB: [birth year]   Cancelled Treatment:    Reason Eval/Treat Not Completed: Active bedrest order this morning. PT will continue to follow and evaluate as appropriate.   Rolm Baptise, PT, DPT   Acute Rehabilitation Department Pager #: [phone]   Gaetana Michaelis 06/16/2020, 7:59 AM

## 2020-06-16 NOTE — Plan of Care (Signed)
  Problem: Education: Goal: Knowledge of General Education information will improve Description: Including pain rating scale, medication(s)/side effects and non-pharmacologic comfort measures Outcome: Progressing   Problem: Clinical Measurements: Goal: Ability to maintain clinical measurements within normal limits will improve Outcome: Progressing Goal: Will remain free from infection Outcome: Progressing Goal: Respiratory complications will improve Outcome: Progressing Goal: Cardiovascular complication will be avoided Outcome: Progressing   Problem: Elimination: Goal: Will not experience complications related to bowel motility Outcome: Progressing Goal: Will not experience complications related to urinary retention Outcome: Progressing   Problem: Pain Managment: Goal: General experience of comfort will improve Outcome: Progressing   Problem: Safety: Goal: Ability to remain free from injury will improve Outcome: Progressing   Problem: Education: Goal: Knowledge of disease or condition will improve Outcome: Progressing Goal: Knowledge of secondary prevention will improve Outcome: Progressing Goal: Knowledge of patient specific risk factors addressed and post discharge goals established will improve Outcome: Progressing Goal: Individualized Educational Video(s) Outcome: Progressing   Problem: Coping: Goal: Will verbalize positive feelings about self Outcome: Progressing Goal: Will identify appropriate support needs Outcome: Progressing   Problem: Health Behavior/Discharge Planning: Goal: Ability to manage health-related needs will improve Outcome: Progressing   Problem: Health Behavior/Discharge Planning: Goal: Ability to manage health-related needs will improve Outcome: Not Progressing   Problem: Clinical Measurements: Goal: Diagnostic test results will improve Outcome: Not Progressing   Problem: Activity: Goal: Risk for activity intolerance will  decrease Outcome: Not Progressing   Problem: Nutrition: Goal: Adequate nutrition will be maintained Outcome: Not Progressing   Problem: Coping: Goal: Level of anxiety will decrease Outcome: Not Progressing   Problem: Skin Integrity: Goal: Risk for impaired skin integrity will decrease Outcome: Not Progressing   Problem: Self-Care: Goal: Ability to participate in self-care as condition permits will improve Outcome: Not Progressing Goal: Verbalization of feelings and concerns over difficulty with self-care will improve Outcome: Not Progressing Goal: Ability to communicate needs accurately will improve Outcome: Not Progressing   Problem: Nutrition: Goal: Risk of aspiration will decrease Outcome: Not Progressing Goal: Dietary intake will improve Outcome: Not Progressing   Problem: Intracerebral Hemorrhage Tissue Perfusion: Goal: Complications of Intracerebral Hemorrhage will be minimized Outcome: Not Progressing

## 2020-06-16 NOTE — Progress Notes (Signed)
STROKE TEAM PROGRESS NOTE   INTERVAL HISTORY No acute events since arrival Found down at home, presented via EMS with severe hypertensive crisis and slurred speech, vomiting. Admitted to neurologic ICU.    Vitals:   06/16/20 0600 06/16/20 0630 06/16/20 0700 06/16/20 0800  BP: 136/63 133/60 (!) 160/70   Pulse: 93 84 (!) 113   Resp: 18 17 17    Temp:    99.2 F (37.3 C)  TempSrc:    Oral  SpO2: 98% 96% 97%   Weight: 84.4 kg     Height:       CBC:  Recent Labs  Lab 06/15/20 1934 06/15/20 1937 06/16/20 0021  WBC 10.4  --  19.7*  NEUTROABS 6.2  --  17.4*  HGB 13.0 13.6 13.2  HCT 39.7 40.0 38.0*  MCV 97.1  --  94.5  PLT 228  --  239   Basic Metabolic Panel:  Recent Labs  Lab 06/15/20 1934 06/15/20 1937 06/16/20 0021  NA 136 139 134*  K 3.9 4.0 4.0  CL 103 104 103  CO2 22  --  22  GLUCOSE 141* 137* 164*  BUN 18 20 20   CREATININE 1.12 1.00 1.08  CALCIUM 8.9  --  8.7*  MG  --   --  2.3  PHOS  --   --  2.8   Lipid Panel:  Recent Labs  Lab 06/16/20 0021  CHOL 195  TRIG 312*  HDL 30*  CHOLHDL 6.5  VLDL 62*  LDLCALC 103*   HgbA1c:  Recent Labs  Lab 06/15/20 1941  HGBA1C 5.6   Urine Drug Screen:  Recent Labs  Lab 06/15/20 2037  LABOPIA NONE DETECTED  COCAINSCRNUR NONE DETECTED  LABBENZ NONE DETECTED  AMPHETMU NONE DETECTED  THCU NONE DETECTED  LABBARB NONE DETECTED    Alcohol Level No results for input(s): ETH in the last 168 hours.  IMAGING past 24 hours MR BRAIN W WO CONTRAST  Result Date: 06/15/2020 CLINICAL DATA:  Follow-up stroke. Right frontoparietal parenchymal hemorrhage. Left temporal hemorrhage. EXAM: MRI HEAD WITHOUT AND WITH CONTRAST TECHNIQUE: Multiplanar, multiecho pulse sequences of the brain and surrounding structures were obtained without and with intravenous contrast. CONTRAST:  7.655mL GADAVIST GADOBUTROL 1 MMOL/ML IV SOLN COMPARISON:  Head CT same day FINDINGS: Brain: No focal abnormality affects the brainstem or cerebellum.  Cerebral hemispheres show innumerable foci of hemosiderin deposition scattered throughout both hemispheres, consistent with previous hemorrhagic infarctions. Intraparenchymal hematoma at the right frontoparietal junction shows internal clot retraction in layering, measuring 7.8 x 4.7 x 4.6 cm. There is no abnormal enhancement to suggest that this represents a hemorrhagic mass. Small hemorrhage in the left temporal lobe cannot be specifically identified by MRI as different than the other foci of hemosiderin deposition. Small amount of subarachnoid hemorrhage within the sulci seen on both sides. After contrast administration, no abnormal enhancement occurs elsewhere within the cerebral hemispheres. Mild mass-effect upon the right lateral ventricle with right-to-left shift 3 mm. Chronic small-vessel ischemic changes of the white matter seen elsewhere. The overall pattern could either be due to amyloid angiopathy or hypertensive vascular disease. Vascular: Major vessels at the base of the brain show flow. Skull and upper cervical spine: Negative Sinuses/Orbits: Clear/normal Other: None IMPRESSION: 1. Innumerable foci of hemosiderin deposition scattered throughout the brain consistent with previous hemorrhagic infarctions. Intraparenchymal hematoma at the right frontoparietal junction with internal clot retraction, measuring 7.8 x 4.7 x 4.6 cm. No abnormal enhancement to suggest that this represents a hemorrhagic mass. Small hemorrhage  in the left temporal lobe cannot be specifically identified by MRI as different than the other foci of hemosiderin deposition. 2. Small amount of subarachnoid hemorrhage within the sulci. 3. Mild mass-effect upon the right lateral ventricle with right-to-left shift 3 mm. Chronic small-vessel ischemic changes of the white matter elsewhere. The overall pattern could either be due to amyloid angiopathy or hypertensive vascular disease. 4. No finding to suggest the presence metastatic disease.  Electronically Signed   By: Paulina Fusi M.D.   On: 06/15/2020 22:56   DG CHEST PORT 1 VIEW  Result Date: 06/15/2020 CLINICAL DATA:  Unresponsive. EXAM: PORTABLE CHEST 1 VIEW COMPARISON:  None. FINDINGS: The heart size and mediastinal contours are within normal limits. Both lungs are clear. The visualized skeletal structures are unremarkable. IMPRESSION: No active disease. Electronically Signed   By: Lupita Raider M.D.   On: 06/15/2020 20:29   CT HEAD CODE STROKE WO CONTRAST  Result Date: 06/15/2020 CLINICAL DATA:  Code stroke.  Neuro deficit, acute, stroke suspected EXAM: CT HEAD WITHOUT CONTRAST TECHNIQUE: Contiguous axial images were obtained from the base of the skull through the vertex without intravenous contrast. COMPARISON:  None. FINDINGS: Brain: Right frontoparietal intraparenchymal hemorrhage measuring 7.6 x 4.7 x 3.9 cm with peripheral vasogenic edema. Sub 5 mm left temporal hemorrhage. Partial effacement of the right lateral ventricle. No evidence of entrapment. Subarachnoid blood products also opacify the right greater than left sulci. No significant midline shift. Vascular: No hyperdense vessel or unexpected calcification. Bilateral carotid siphon atherosclerotic calcifications. Skull: Negative for fracture or focal lesion. Sinuses/Orbits: Normal orbits. Mild paranasal sinus mucosal thickening. No mastoid effusion. Other: None. ASPECTS [facility] Stroke Program Early CT Score) - Ganglionic level infarction (caudate, lentiform nuclei, internal capsule, insula, M1-M3 cortex): 7 - Supraganglionic infarction (M4-M6 cortex): 3 Total score (0-10 with 10 being normal): 10 IMPRESSION: 1. Right frontoparietal parenchymal hemorrhage measuring 7.6 cm. Sub-5 mm left temporal hemorrhage. 2. No significant midline shift or ventricular entrapment. 3. Right greater than left cerebral convexity subarachnoid blood products. 4. ASPECTS is 10. Code stroke imaging results were communicated on 06/15/2020 at 7:50 pm  to provider Bhagat via secure text paging. Electronically Signed   By: Stana Bunting M.D.   On: 06/15/2020 19:53   PHYSICAL EXAM Physical Exam  Constitutional: Appears well-developed and well-nourished. Hard cervical collar in place.  Psych: Affect appropriate to situation, flat but cooperative Eyes: No scleral injection HENT: No oropharyngeal obstruction, c-collar in place.  MSK: no joint deformities.  Cardiovascular: Normal rate to slight tachycardia and regular rhythm.  Respiratory: Rhonchorous breath sounds GI: Soft.  No distension. There is no tenderness.  Skin: Warm dry and intact visible skin  Neuro: Mental Status: Patient is drowsy but opens eyes to voice, oriented to person, month, and follows simple commands No signs of aphasia or neglect Cranial Nerves: II: Visual Fields are notable for a left hemianopia. Pupils are equal, round, and reactive to light.  5 to 2 mm bilaterally III,IV, VI: Right gaze preference, can track to midline V: Facial sensation is slightly reduced to eyelash brush on the left VII: Facial movement is notable for a left facial droop.  VIII: hearing is intact to voice X: Uvula difficult to visualize XI: Shoulder shrug is symmetric. XII: tongue is midline without atrophy or fasciculations.  Motor: Tone is increased in the left upper extremity and lower extremity. Bulk is normal.  He moves the right side freely antigravity, at least 4/5.  On the left side he was  initially flaccid, then extensor posturing in the left upper extremity and triple flexion in the left lower extremity Sensory: Sensation appears reduced in the left arm and leg Deep Tendon Reflexes: 2+ and symmetric in the biceps and patellae. Plantars: Toes are upgoing on the left, down on the right Cerebellar: Finger-to-nose and toe to hand is intact on the right  ASSESSMENT/PLAN  86 year old male with very minimal past medical history.  Ate dinner with his wife at 1730 and having a  conversation till 1800 hrs.  Abruptly at 1815 was found to be down.  EMS on arrival found his blood pressure to be high systolic 200s with slurred speech and left sided weakness.  He had an episode of emesis on route and was transiently hypoxemic.  Multifocal intracerebral hemorrhage, largest in the right MCA territory with associated right MCA syndrome, cerebral edema and brain compression .  Head CT with a large (greater than 30 cc, 7.6 x 4.7 largest axial dimensions) intraparenchymal hemorrhage of the right insula with surrounding edema and mass-effect on the lateral ventricle without intraventricular extension.  There is also a small left temporal punctate hyperdensity concerning for hemorrhage and subarachnoid blood bilaterally, right greater than left   CTA head & neck  Stable appearance of 7.7 cm right frontoparietal hemorrhage. 2-3 mm leftward midline shift, unchanged. Sub 5 mm left temporal hemorrhage and bilateral subarachnoid blood products, unchanged.   CTA neck: No high-grade narrowing or large vessel occlusion within the neck. Less than 50% narrowing of the left CCA and bilateral proximal ICAs.   CTA head: No large vessel occlusion. High-grade distal right V4 segment narrowing. Mild to moderate right M2, right ICA terminus, distal basilar artery and bilateral PCA narrowing.   Cervical spine CT: No acute fracture or traumatic listhesis. Multilevel spondylosis most prominent at the C3-6 levels.   MRI  Innumerable foci of hemosiderin deposition scattered throughout the brain consistent with previous hemorrhagic infarctions.Intraparenchymal hematoma at the right frontoparietal junction with internal clot retraction,measuring 7.8 x 4.7 x 4.6 cm. No abnormal enhancement to suggest that this represents a hemorrhagic mass. Small hemorrhage in the left temporal lobe cannot be specifically identified by MRI as different than the other foci of hemosiderin deposition.Small  amount of subarachnoid hemorrhage within the sulci. Mild mass-effect upon the right lateral ventricle with right-to-left shift 3 mm. Chronic small-vessel ischemic changes of the white matter elsewhere. The overall pattern of multiple hemorrhages in lobar, cortical, and cortical-subcortical regions in patient >24yrs meets criteria for probable cerebral amyloid angiopathy.   2D Echo: completed. Pending read.   LDL 103  HgbA1c 5.6  VTE prophylaxis - SCDs  Recommended NPO per SLP.   Hold Anticoagulant/Antiplatelet medications in setting of IPH, SAH  No AC/AP prior to admission  Therapy recommendations:  TBD pending stabilization  Disposition:  TBD  Hypertension  Home meds: none, no hx of HTN but with BP 200s on EMS arrival  Stable on cleviprex drip . BP goal 140-160  . Long-term BP goal normotensive  Hyperlipidemia  Home meds:  None   LDL 103, goal < 70  Add Lipitor for high intensity statin when cleared by speech or feeding tube in place.   Continue statin at discharge  Other Stroke Risk Factors  Advanced Age >/= 23   Other Problems: Concern for C Spine injury  Continue cervical collar   CT cervical spine  No acute fracture or traumatic listhesis.  ? NSTEMI/At risk for MI Cardiac monitoring   Hospital day # 1  Marisue Humble, MD Page: [phone]   To contact Stroke Continuity provider, please refer to [email or web address]. After hours, contact General Neurology

## 2020-06-16 NOTE — Progress Notes (Addendum)
STROKE TEAM PROGRESS NOTE   INTERVAL HISTORY No acute events since arrival Found down at home, presented via EMS with severe hypertensive crisis and slurred speech, vomiting. Admitted to neurologic ICU.          Vitals:   06/16/20 0600 06/16/20 0630 06/16/20 0700 06/16/20 0800  BP: 136/63 133/60 (!) 160/70   Pulse: 93 84 (!) 113   Resp: 18 17 17    Temp:    99.2 F (37.3 C)  TempSrc:    Oral  SpO2: 98% 96% 97%   Weight: 84.4 kg     Height:       CBC:  Last Labs        Recent Labs  Lab 06/15/20 1934 06/15/20 1937 06/16/20 0021  WBC 10.4  --  19.7*  NEUTROABS 6.2  --  17.4*  HGB 13.0 13.6 13.2  HCT 39.7 40.0 38.0*  MCV 97.1  --  94.5  PLT 228  --  239     Basic Metabolic Panel:  Last Labs        Recent Labs  Lab 06/15/20 1934 06/15/20 1937 06/16/20 0021  NA 136 139 134*  K 3.9 4.0 4.0  CL 103 104 103  CO2 22  --  22  GLUCOSE 141* 137* 164*  BUN 18 20 20   CREATININE 1.12 1.00 1.08  CALCIUM 8.9  --  8.7*  MG  --   --  2.3  PHOS  --   --  2.8     Lipid Panel:  Last Labs      Recent Labs  Lab 06/16/20 0021  CHOL 195  TRIG 312*  HDL 30*  CHOLHDL 6.5  VLDL 62*  LDLCALC 103*     [id number]:  Last Labs      Recent Labs  Lab 06/15/20 1941  HGBA1C 5.6     Urine Drug Screen:  Last Labs      Recent Labs  Lab 06/15/20 2037  LABOPIA NONE DETECTED  COCAINSCRNUR NONE DETECTED  LABBENZ NONE DETECTED  AMPHETMU NONE DETECTED  THCU NONE DETECTED  LABBARB NONE DETECTED      Alcohol Level  Last Labs   No results for input(s): ETH in the last 168 hours.    IMAGING past 24 hours MR BRAIN W WO CONTRAST  Result Date: 06/15/2020 CLINICAL DATA:  Follow-up stroke. Right frontoparietal parenchymal hemorrhage. Left temporal hemorrhage. EXAM: MRI HEAD WITHOUT AND WITH CONTRAST TECHNIQUE: Multiplanar, multiecho pulse sequences of the brain and surrounding structures were obtained without and with intravenous contrast. CONTRAST:   7.835mL GADAVIST GADOBUTROL 1 MMOL/ML IV SOLN COMPARISON:  Head CT same day FINDINGS: Brain: No focal abnormality affects the brainstem or cerebellum. Cerebral hemispheres show innumerable foci of hemosiderin deposition scattered throughout both hemispheres, consistent with previous hemorrhagic infarctions. Intraparenchymal hematoma at the right frontoparietal junction shows internal clot retraction in layering, measuring 7.8 x 4.7 x 4.6 cm. There is no abnormal enhancement to suggest that this represents a hemorrhagic mass. Small hemorrhage in the left temporal lobe cannot be specifically identified by MRI as different than the other foci of hemosiderin deposition. Small amount of subarachnoid hemorrhage within the sulci seen on both sides. After contrast administration, no abnormal enhancement occurs elsewhere within the cerebral hemispheres. Mild mass-effect upon the right lateral ventricle with right-to-left shift 3 mm. Chronic small-vessel ischemic changes of the white matter seen elsewhere. The overall pattern could either be due to amyloid angiopathy or hypertensive vascular disease. Vascular: Major vessels at the base  of the brain show flow. Skull and upper cervical spine: Negative Sinuses/Orbits: Clear/normal Other: None IMPRESSION: 1. Innumerable foci of hemosiderin deposition scattered throughout the brain consistent with previous hemorrhagic infarctions. Intraparenchymal hematoma at the right frontoparietal junction with internal clot retraction, measuring 7.8 x 4.7 x 4.6 cm. No abnormal enhancement to suggest that this represents a hemorrhagic mass. Small hemorrhage in the left temporal lobe cannot be specifically identified by MRI as different than the other foci of hemosiderin deposition. 2. Small amount of subarachnoid hemorrhage within the sulci. 3. Mild mass-effect upon the right lateral ventricle with right-to-left shift 3 mm. Chronic small-vessel ischemic changes of the white matter elsewhere. The  overall pattern could either be due to amyloid angiopathy or hypertensive vascular disease. 4. No finding to suggest the presence metastatic disease. Electronically Signed   By: Paulina Fusi M.D.   On: 06/15/2020 22:56   DG CHEST PORT 1 VIEW  Result Date: 06/15/2020 CLINICAL DATA:  Unresponsive. EXAM: PORTABLE CHEST 1 VIEW COMPARISON:  None. FINDINGS: The heart size and mediastinal contours are within normal limits. Both lungs are clear. The visualized skeletal structures are unremarkable. IMPRESSION: No active disease. Electronically Signed   By: Lupita Raider M.D.   On: 06/15/2020 20:29   CT HEAD CODE STROKE WO CONTRAST  Result Date: 06/15/2020 CLINICAL DATA:  Code stroke.  Neuro deficit, acute, stroke suspected EXAM: CT HEAD WITHOUT CONTRAST TECHNIQUE: Contiguous axial images were obtained from the base of the skull through the vertex without intravenous contrast. COMPARISON:  None. FINDINGS: Brain: Right frontoparietal intraparenchymal hemorrhage measuring 7.6 x 4.7 x 3.9 cm with peripheral vasogenic edema. Sub 5 mm left temporal hemorrhage. Partial effacement of the right lateral ventricle. No evidence of entrapment. Subarachnoid blood products also opacify the right greater than left sulci. No significant midline shift. Vascular: No hyperdense vessel or unexpected calcification. Bilateral carotid siphon atherosclerotic calcifications. Skull: Negative for fracture or focal lesion. Sinuses/Orbits: Normal orbits. Mild paranasal sinus mucosal thickening. No mastoid effusion. Other: None. ASPECTS [facility] Stroke Program Early CT Score) - Ganglionic level infarction (caudate, lentiform nuclei, internal capsule, insula, M1-M3 cortex): 7 - Supraganglionic infarction (M4-M6 cortex): 3 Total score (0-10 with 10 being normal): 10 IMPRESSION: 1. Right frontoparietal parenchymal hemorrhage measuring 7.6 cm. Sub-5 mm left temporal hemorrhage. 2. No significant midline shift or ventricular entrapment. 3. Right  greater than left cerebral convexity subarachnoid blood products. 4. ASPECTS is 10. Code stroke imaging results were communicated on 06/15/2020 at 7:50 pm to provider Bhagat via secure text paging. Electronically Signed   By: Stana Bunting M.D.   On: 06/15/2020 19:53   PHYSICAL EXAM Physical Exam Constitutional: Appears well-developed and well-nourished. Hard cervical collar in place.  Psych: Affect appropriate to situation,flat but cooperative Eyes: No scleral injection HENT: No oropharyngeal obstruction, c-collar in place.  MSK: no joint deformities.  Cardiovascular: Normal rateto slight tachycardiaand regular rhythm.  Respiratory:Rhonchorous breath sounds GI: Soft. No distension. There is no tenderness.  Skin: Warm dry and intact visible skin  Neuro: Mental Status: Patient isdrowsy but opens eyes to voice, oriented to person, month,and follows simple commands No signs of aphasia or neglect Cranial Nerves: II: Visual Fields arenotable for a left hemianopia. Pupils are equal, round, and reactive to light.5 to 2 mm bilaterally III,IV, UY:[id number] gaze preference, can track to midline V: Facial sensation isslightly reduced to eyelash brush on the left VII: Facial movement isnotable for a left facial droop.  VIII: hearing is intact to voice X: Uvuladifficult to visualize  XI: Shoulder shrug is symmetric. XII: tongue is midline without atrophy or fasciculations.  Motor: Tone isincreased in the left upper extremity and lower extremity. Bulk is normal.He moves the right side freely antigravity, at least 4/5. On the left side he was initially flaccid, then extensor posturing in the left upper extremity and triple flexion in the left lower extremity Sensory: Sensation appears reduced in the left arm and leg Deep Tendon Reflexes: 2+ and symmetric in the biceps and patellae. Plantars: Toes are upgoing on the left, down on the right Cerebellar: Finger-to-nose and toe  to hand is intact on the right  ASSESSMENT/PLAN  86 year old male with very minimal past medical history. Ate dinner with his wife at 1730 and having a conversationtill 1800 hrs.Abruptly at 1815 was found to be down. EMS on arrival found his blood pressure to be high systolic 200s with slurred speech and left sided weakness.He had an episode of emesis on route and was transiently hypoxemic.  Multifocal intracerebral hemorrhages, largest in the right MCA territory with associated right MCA syndrome, cerebral edema and brain compression .  Head CT with a large(greater than 30 cc, 7.6 x 4.7largest axial dimensions)intraparenchymal hemorrhage of the right insula with surrounding edema and mass-effect on the lateral ventricle without intraventricular extension. There is also a small left temporal punctate hyperdensity concerning for hemorrhage and subarachnoid blood bilaterally, right greater than left   CTA head & neck  Stable appearance of 7.7 cm right frontoparietal hemorrhage. 2-3 mm leftward midline shift, unchanged. Sub 5 mm left temporal hemorrhage and bilateral subarachnoid blood products, unchanged.   CTA neck: No high-grade narrowing or large vessel occlusion within the neck. Less than 50% narrowing of the left CCA and bilateral proximal ICAs.   CTA head: No large vessel occlusion. High-grade distal right V4 segment narrowing. Mild to moderate right M2, right ICA terminus, distal basilar artery and bilateral PCA narrowing.   Cervical spine CT: No acute fracture or traumatic listhesis. Multilevel spondylosis most prominent at the C3-6 levels.   MRI  Innumerable foci of hemosiderin deposition scattered throughout the brain consistent with previous hemorrhagic infarctions.Intraparenchymal hematoma at the right frontoparietal junction with internal clot retraction, measuring 7.8 x 4.7 x 4.6 cm. No abnormal enhancement to suggest that this represents a hemorrhagic  mass. Small hemorrhage in the left temporal lobe cannot be specifically identified by MRI as different than the other foci of hemosiderin deposition.Small amount of subarachnoid hemorrhage within the sulci. Mild mass-effect upon the right lateral ventricle with right-to-left shift 3 mm. Chronic small-vessel ischemic changes of the white matter elsewhere. The overall pattern of multiple hemorrhages in lobar, cortical, and cortical-subcortical regions in patient >49yrs meets criteria for probable cerebral amyloid angiopathy.   2D Echo: completed. Pending read.   LDL 103  HgbA1c 5.6  VTE prophylaxis - SCDs  Recommended NPO per SLP.   Hold Anticoagulant/Antiplatelet medications in setting of IPH, SAH  No AC/AP prior to admission  Therapy recommendations:  TBD pending stabilization  Disposition:  TBD  Hypertension  Home meds: none, no hx of HTN but with BP 200s on EMS arrival  Stable on cleviprex drip  BP goal 140-160   Long-term BP goal normotensive  Hyperlipidemia  Home meds:  None   LDL 103, goal < 70  Add Lipitor for high intensity statin when cleared by speech or feeding tube in place.   Continue statin at discharge  Other Stroke Risk Factors  Advanced Age >/= 39   Other Problems: Concern for C  Spine injury  Continue cervical collar   CT cervical spine  No acute fracture or traumatic listhesis.  Hospital day # 1  Marisue Humble, MD Page: [phone]    To contact Stroke Continuity provider, please refer to [email or web address]. After hours, contact General Neurology

## 2020-06-16 NOTE — Evaluation (Signed)
Physical Therapy Evaluation Patient Details Name: Calvin Yates MRN: [id number] DOB: February 03, 1935 Today's Date: 06/16/2020   History of Present Illness  The pt is an 86 yo male presenting with elevated BP and tachycardia after being found down by his wife. 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    Clinical Impression  Pt in bed upon arrival of PT/OT. Prior to admission the pt was mobilizing independently, living at home as primary caretaker of his wife with dementia. The pt now presents with limitations in functional mobility, strength, coordination, L-sided attention, and stability due to above dx, and will continue to benefit from skilled PT to address these deficits. The pt was able to tolerate transition to chair position with VSS, but required total A to complete all bed mobility and repositioning at this time. The pt was able to respond to multiple simple cues with RUE and RLE (such as "touch your nose"), but was unable to generate active movement of L extremities to command at this time. The pt does demo withdrawal in all four extremities. Discussed CIR level therapies as means of facilitating return to independence, but that family may still be needed for assist and supervision at d/c.      Follow Up Recommendations CIR    Equipment Recommendations   (defer to post acute)    Recommendations for Other Services Rehab consult     Precautions / Restrictions Precautions Precautions: Fall Precaution Comments: SBP <140 Required Braces or Orthoses: Cervical Brace Cervical Brace: Hard collar Restrictions Weight Bearing Restrictions: No      Mobility  Bed Mobility Overal bed mobility: Needs Assistance Bed Mobility: Rolling Rolling: Total assist         General bed mobility comments: totalA to pull to sit and assist with repositioning in bed. VSS in chair position with HOB at 60 deg    Transfers                  General transfer comment: unable to test due to decreased arousal     Modified Rankin (Stroke Patients Only) Modified Rankin (Stroke Patients Only) Pre-Morbid Rankin Score: No symptoms Modified Rankin: Severe disability     Balance                                             Pertinent Vitals/Pain Pain Assessment: Faces Faces Pain Scale: Hurts little more Pain Location: grimacing and withdawal to noxious stimuli in all 4 extremities Pain Descriptors / Indicators: Grimacing Pain Intervention(s): Repositioned    Home Living Family/patient expects to be discharged to:: Private residence Living Arrangements: Spouse/significant other Available Help at Discharge: Family;Available 24 hours/day Type of Home: House Home Access: Stairs to enter   [employer] of Steps: 4 Home Layout: One level Home Equipment: None Additional Comments: pt lives with wife who has dementia (pt is primary caretaker)    Prior Function Level of Independence: Independent         Comments: driving, 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     Hand Dominance   Dominant Hand: Right    Extremity/Trunk Assessment   Upper Extremity Assessment Upper Extremity Assessment: Defer to OT evaluation    Lower Extremity Assessment Lower Extremity Assessment: Generalized weakness;LLE deficits/detail LLE Deficits / Details: no active movement to command in LLE, some resistance noted during initial ROM, but reduced with  continued movement. No clonus. responds to noxious stimuli       Communication   Communication: Expressive difficulties  Cognition Arousal/Alertness: Lethargic;Suspect due to medications Behavior During Therapy: Impulsive Overall Cognitive Status: Impaired/Different from baseline                                 General Comments: loss of consciousness      General Comments General comments (skin integrity, edema, etc.): BP supine: 138/56; chair  position 60* 151/56; chair position 45* 144/55. daughter present at end of session, educated on ways to engage with pt, as well as what to expect with rehab course    Exercises General Exercises - Lower Extremity Ankle Circles/Pumps: AAROM;Right;5 reps;Supine Short Arc Quad: AAROM;Right;10 reps;Supine Other Exercises Other Exercises: PROM to LLE across hip, knee, and ankle joints.   Assessment/Plan    PT Assessment Patient needs continued PT services  PT Problem List Decreased strength;Decreased range of motion;Decreased activity tolerance;Decreased balance;Decreased mobility;Decreased coordination;Decreased cognition;Decreased safety awareness;Impaired tone;Impaired sensation;Decreased knowledge of precautions       PT Treatment Interventions DME instruction;Gait training;Stair training;Functional mobility training;Therapeutic activities;Therapeutic exercise;Balance training;Neuromuscular re-education;Cognitive remediation;Patient/family education    PT Goals (Current goals can be found in the Care Plan section)  Acute Rehab PT Goals Patient Stated Goal: none stated PT Goal Formulation: Patient unable to participate in goal setting Time For Goal Achievement: 06/30/20 Potential to Achieve Goals: Fair    Frequency Min 4X/week   Barriers to discharge        Co-evaluation PT/OT/SLP Co-Evaluation/Treatment: Yes Reason for Co-Treatment: Complexity of the patient's impairments (multi-system involvement);Necessary to address cognition/behavior during functional activity PT goals addressed during session: Strengthening/ROM         AM-PAC PT "6 Clicks" Mobility  Outcome Measure Help needed turning from your back to your side while in a flat bed without using bedrails?: Total Help needed moving from lying on your back to sitting on the side of a flat bed without using bedrails?: Total Help needed moving to and from a bed to a chair (including a wheelchair)?: Total Help needed  standing up from a chair using your arms (e.g., wheelchair or bedside chair)?: Total Help needed to walk in hospital room?: Total Help needed climbing 3-5 steps with a railing? : Total 6 Click Score: 6    End of Session   Activity Tolerance: Patient limited by lethargy Patient left: in bed;with call bell/phone within reach;with bed alarm set;with family/visitor present Nurse Communication: Mobility status PT Visit Diagnosis: Hemiplegia and hemiparesis Hemiplegia - Right/Left: Left Hemiplegia - dominant/non-dominant: Non-dominant Hemiplegia - caused by: Nontraumatic intracerebral hemorrhage    Time: 9811-9147 PT Time Calculation (min) (ACUTE ONLY): 31 min   Charges:   PT Evaluation $PT Eval Moderate Complexity: 1 Mod          Rolm Baptise, PT, DPT   Acute Rehabilitation Department Pager #: [phone]  Gaetana Michaelis 06/16/2020, 1:18 PM

## 2020-06-16 NOTE — Progress Notes (Signed)
Inpatient Rehab Admissions Coordinator Note:   Per PT/OT recommendations, pt was screened for CIR candidacy by Wolfgang Phoenix, MS, CCC-SLP.  At this time we are not recommending an inpatient rehab consult.  Will continue to follow pt's tolerance and progress with therapies to help determine if pt appropriate for CIR consult, as pt was medicated during initial evaluations. Please contact me with questions.    Wolfgang Phoenix, MS, CCC-SLP Admissions Coordinator [phone] 06/16/20 4:19 PM

## 2020-06-16 NOTE — Progress Notes (Signed)
At 1600 assessment pt unable to follow commands. Pt also with 101.8 ax temp. Dr. Thomasena Edis notified. At 1615 pt able to start following commands. Dr. Thomasena Edis called RN back after reviewing previous am CTA. Orders given for UA, blood cultures x 2 and chest xray.

## 2020-06-16 NOTE — Evaluation (Signed)
Occupational Therapy Evaluation Patient Details Name: Calvin Yates MRN: [id number] DOB: 11/08/1934 Today's Date: 06/16/2020    History of Present Illness The pt is an 86 yo male presenting with elevated BP and tachycardia after being found down by his wife. 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   Clinical Impression   Pt PTA:pt was independent, driving and his spouse's caregiver. Pt currently, pt limited by recent medication of fentanyl for headache/pain management and pt unable to fully arouse so totalA for all ADL and some of RUE for purposeful movement. Pt appears to have intact cognition intermittently following commands on R side only; inconsistent pain response of withdrawal on L side. Pt did not open eyes or talk throughout session. Daughter came in room to conclude session.  RN aware of BP  >140, but at end of session pt had returned to <140 SBP. Pt's BP supine: 138/56; chair position 60* 151/56; chair position 45* 144/55. Pt would benefit from continued OT skilled services. OT following acutely.    Follow Up Recommendations  CIR;Supervision/Assistance - 24 hour    Equipment Recommendations  3 in 1 bedside commode;Wheelchair (measurements OT);Wheelchair cushion (measurements OT);Hospital bed    Recommendations for Other Services Rehab consult     Precautions / Restrictions Precautions Precautions: Fall Precaution Comments: SBP <140 Required Braces or Orthoses: Cervical Brace Cervical Brace: Hard collar Restrictions Weight Bearing Restrictions: No      Mobility Bed Mobility Overal bed mobility: Needs Assistance Bed Mobility: Rolling Rolling: Total assist         General bed mobility comments: totalA to pull to sit and assist with repositioning in bed. VSS in chair position with HOB at 60 deg    Transfers                 General transfer comment: unable to test due to decreased arousal    Balance                                            ADL either performed or assessed with clinical judgement   ADL Overall ADL's : Needs assistance/impaired Eating/Feeding: NPO   Grooming: Total assistance   Upper Body Bathing: Total assistance   Lower Body Bathing: Total assistance   Upper Body Dressing : Total assistance   Lower Body Dressing: Total assistance   Toilet Transfer: Total assistance   Toileting- Clothing Manipulation and Hygiene: Total assistance       Functional mobility during ADLs: Total assistance General ADL Comments: Pt limited by recent medication of fentanyl for headache/pain management and pt unable to fully arouse so totalA for all ADL and some of RUE for purposeful movement.     Vision Baseline Vision/History: Wears glasses Wears Glasses: At all times Patient Visual Report: No change from baseline Vision Assessment?: Vision impaired- to be further tested in functional context Additional Comments: did not open eyes this session     Perception     Praxis      Pertinent Vitals/Pain Pain Assessment: Faces Faces Pain Scale: No hurt Pain Intervention(s): Limited activity within patient's tolerance;Monitored during session;Premedicated before session;Repositioned     Hand Dominance Right   Extremity/Trunk Assessment Upper Extremity Assessment Upper Extremity Assessment: Defer to OT evaluation RUE Deficits / Details: AROM, shoulder <80* FF and elbow wrist and hand with  resistance overall A/AROM, WFLs; thumbs  up, touch nose with index finger LUE Deficits / Details: 0/5 MM grade; pain response to stimuli inconsistent on arm/hand; tone noted. No voluntary movement noted.   Lower Extremity Assessment Lower Extremity Assessment: Generalized weakness;LLE deficits/detail LLE Deficits / Details: no active movement to command in LLE, some resistance noted during initial ROM, but reduced with continued movement. No clonus. responds to noxious  stimuli       Communication Communication Communication: Expressive difficulties   Cognition Arousal/Alertness: Lethargic;Suspect due to medications Behavior During Therapy: Impulsive Overall Cognitive Status: Impaired/Different from baseline                                 General Comments: loss of consciousness   General Comments  BP supine: 138/56; chair position 60* 151/56; chair position 45* 144/55. daughter present at end of session, educated on ways to engage with pt, as well as what to expect with rehab course    Exercises Exercises: Other exercises;General Lower Extremity General Exercises - Upper Extremity Shoulder Flexion: AAROM;Right;5 reps;Supine;PROM;Left General Exercises - Lower Extremity Ankle Circles/Pumps: AAROM;Right;5 reps;Supine Short Arc Quad: AAROM;Right;10 reps;Supine Other Exercises Other Exercises: PROM to LLE across hip, knee, and ankle joints.   Shoulder Instructions      Home Living Family/patient expects to be discharged to:: Private residence Living Arrangements: Spouse/significant other Available Help at Discharge: Family;Available 24 hours/day Type of Home: House Home Access: Stairs to enter [employer] of Steps: 4   Home Layout: One level     Bathroom Shower/Tub: Tub/shower unit;Walk-in shower   Bathroom Toilet: Standard     Home Equipment: None   Additional Comments: pt lives with wife who has dementia (pt is primary caretaker)      Prior Functioning/Environment Level of Independence: Independent        Comments: driving, 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        OT Problem List: Decreased strength;Decreased activity tolerance;Impaired balance (sitting and/or standing);Impaired vision/perception;Decreased coordination;Decreased cognition;Decreased safety awareness;Impaired UE functional use;Increased edema      OT Treatment/Interventions: Self-care/ADL training;Therapeutic exercise;Energy  conservation;DME and/or AE instruction;Therapeutic activities;Cognitive remediation/compensation;Visual/perceptual remediation/compensation;Patient/family education;Balance training    OT Goals(Current goals can be found in the care plan section) Acute Rehab OT Goals Patient Stated Goal: none stated OT Goal Formulation: Patient unable to participate in goal setting Time For Goal Achievement: 06/30/20 Potential to Achieve Goals: Fair ADL Goals Pt Will Perform Eating: with min assist;with adaptive utensils;sitting;bed level Pt Will Perform Grooming: with min guard assist;sitting Pt Will Transfer to Toilet: with mod assist;stand pivot transfer;bedside commode Pt/caregiver will Perform Home Exercise Program: Increased ROM;Increased strength;Left upper extremity;With minimal assist Additional ADL Goal #1: Pt will follow 1-2 step commands with multimodal cues for sequencing. Additional ADL Goal #2: Pt will perform bed mobility with modA as precursor for ADL.  OT Frequency: Min 2X/week   Barriers to D/C:            Co-evaluation PT/OT/SLP Co-Evaluation/Treatment: Yes Reason for Co-Treatment: Complexity of the patient's impairments (multi-system involvement);Necessary to address cognition/behavior during functional activity PT goals addressed during session: Strengthening/ROM OT goals addressed during session: Strengthening/ROM      AM-PAC OT "6 Clicks" Daily Activity     Outcome Measure Help from another person eating meals?: Total Help from another person taking care of personal grooming?: Total Help from another person toileting, which includes using toliet, bedpan, or urinal?: Total Help from another person bathing (including washing, rinsing, drying)?: Total  Help from another person to put on and taking off regular upper body clothing?: Total Help from another person to put on and taking off regular lower body clothing?: Total 6 Click Score: 6   End of Session Equipment Utilized  During Treatment: Cervical collar Nurse Communication: Mobility status  Activity Tolerance: Patient limited by lethargy Patient left: in bed;with call bell/phone within reach;with bed alarm set;with family/visitor present  OT Visit Diagnosis: Unsteadiness on feet (R26.81);Muscle weakness (generalized) (M62.81);Cognitive communication deficit (R41.841);Other symptoms and signs involving cognitive function;Hemiplegia and hemiparesis Symptoms and signs involving cognitive functions: Nontraumatic intracerebral hemorrhage;Other Nontraumatic ICH Hemiplegia - Right/Left: Left Hemiplegia - dominant/non-dominant: Non-Dominant Hemiplegia - caused by: Other cerebrovascular disease                Time: 8466-5993 OT Time Calculation (min): 38 min Charges:  OT General Charges $OT Visit: 1 Visit OT Evaluation $OT Eval High Complexity: 1 High  Flora Lipps, OTR/L Acute Rehabilitation Services Pager: [phone] Office: [phone]   Beretta Ginsberg C 06/16/2020, 2:22 PM

## 2020-06-16 NOTE — Consult Note (Signed)
NAME:  Calvin Yates, MRN:  [id number], DOB:  1935-01-16, LOS: 1 ADMISSION DATE:  06/15/2020, CONSULTATION DATE:  06/15/20 REFERRING MD:  Dr Iver Nestle - neuro, CHIEF COMPLAINT:   - Acute encephalopathy due to ICH  Brief History:  Acute encephalopathy due to ICH  History of Present Illness: Obtained from [facility] neuro hospitalist.  Patient unable to give history  86 year old male with very minimal past medical history.  Ate dinner with his wife at 1730 and having a conversation till 1800 hrs.  Abruptly at 1815 was found to be down.  EMS on arrival found his blood pressure to be high systolic 200s.  He had an episode of emesis on route and transiently hypoxemic.  But in the ER for the 90 minutes prior to CCM evaluation saturation 95% on room air and no further emesis.  He is drowsy but following commands with hemiparesis on the left side.  7 cm right intracranial hemorrhage frontoparietal region hemorrhage without midline shift.  Patient's not intubated.  He has a hard collar on because of his fall.  C-spine CT is pending.  CCM called because of high risk of worsening encephalopathy and respiratory failure  Past Medical History:    has no past medical history on file.   reports that he has never smoked. He has never used smokeless tobacco.  Past Surgical History:  Procedure Laterality Date  . APPENDECTOMY    . FINGER SURGERY      Allergies  Allergen Reactions  . Amoxicillin Rash     There is no immunization history on file for this patient.  Family History  Problem Relation Age of Onset  . Hypertension Mother   . Hypercholesterolemia Mother   . Heart disease Mother      Current Facility-Administered Medications:  .  acetaminophen (TYLENOL) tablet 650 mg, 650 mg, Oral, Q4H PRN **OR** acetaminophen (TYLENOL) 160 MG/5ML solution 650 mg, 650 mg, Per Tube, Q4H PRN **OR** acetaminophen (TYLENOL) suppository 650 mg, 650 mg, Rectal, Q4H PRN, Bhagat, Srishti L, MD .  chlorhexidine (PERIDEX)  0.12 % solution 15 mL, 15 mL, Mouth Rinse, BID, Bhagat, Srishti L, MD, 15 mL at 06/16/20 1015 .  Chlorhexidine Gluconate Cloth 2 % PADS 6 each, 6 each, Topical, Daily, Bhagat, Srishti L, MD, 6 each at 06/16/20 1015 .  clevidipine (CLEVIPREX) infusion 0.5 mg/mL, 0-32 mg/hr, Intravenous, Continuous, Charlott Holler, MD, Last Rate: 52 mL/hr at 06/16/20 0855, 26 mg/hr at 06/16/20 0855 .  fentaNYL (SUBLIMAZE) injection 75 mcg, 75 mcg, Intravenous, Q2H PRN, Rica Mote, MD, 75 mcg at 06/16/20 1014 .  insulin aspart (novoLOG) injection 0-6 Units, 0-6 Units, Subcutaneous, Q4H, Bhagat, Srishti L, MD, 1 Units at 06/16/20 0349 .  ondansetron (ZOFRAN) injection 4 mg, 4 mg, Intravenous, Q6H PRN, Bhagat, Srishti L, MD, 4 mg at 06/16/20 1014 .  pantoprazole (PROTONIX) injection 40 mg, 40 mg, Intravenous, QHS, Bhagat, Srishti L, MD .  senna-docusate (Senokot-S) tablet 1 tablet, 1 tablet, Oral, BID, Bhagat, Karmen Bongo, MD   Significant Hospital Events:  06/15/2020 - admit   Consults:  06/15/2020  - ccm  Procedures:  x  Significant Diagnostic Tests:  06/15/2020 - CT HEAD:  IMPRESSION: 1. Right frontoparietal parenchymal hemorrhage measuring 7.6 cm. Sub-5 mm left temporal hemorrhage. 2. No significant midline shift or ventricular entrapment. 3. Right greater than left cerebral convexity subarachnoid blood products. 4. ASPECTS is 10.  Micro Data:  x  Antimicrobials:  x   Interim History / Subjective:   Overnight  moved to neuro ICU for ongoing monitoring. CCM consulted for impending respiratory failure  Objective   Blood pressure (!) 137/48, pulse 91, temperature 100 F (37.8 C), temperature source Axillary, resp. rate 17, height 5\' 8"  (1.727 m), weight 84.4 kg, SpO2 94 %.        Intake/Output Summary (Last 24 hours) at 06/16/2020 1352 Last data filed at 06/16/2020 1300 Gross per 24 hour  Intake 548.95 ml  Output --  Net 548.95 ml   Filed Weights   06/15/20 2001 06/16/20 0000  06/16/20 0600  Weight: 81.3 kg 84.4 kg 84.4 kg    Examination: General: Stable looking male in a hard c-collar lying in the ER bed HENT: in C-collar. Left sided facial droop Lungs: No respiratory distress Cardiovascular: RRR Abdomen: Soft nontender no organomegaly Extremities: Intact able to wiggle his right toes on command, no edema Neuro: somnolent but arousable and follows commands.   Labs personally reviewed - Na 134, K 4.0, Cr 1.08, Hgb 13.2  Resolved Hospital Problem list   x  Assessment & Plan:   Hypertensive associated with intracranial hemorrhage Continue cleviprex - unable to take PO after SLP eval. Will need cor trak placement and then addition of oral medications. - HTN new diagnosis, echo pending  High risk for acute respiratory failure requiring intubation due to large intracranial hemorrhage and associated acute encephalopathy - currently protective airway, repeat head CT stable - will continue to monitor for ongoing respiratory failure/distress  Acute encephalopathy - 7 cm right intracranial hemorrhage at admission 06/15/2020  Concern for C Spine injury - repeat CT head shows stable hemorrhage this morning with 2-3 mm midline shift.   Best practice (evaluated daily)  Diet: N.p.o. due to aspiration risk, failed SLP eval on 2/26 Pain/Anxiety/Delirium protocol (if indicated): Currently none VAP protocol (if indicated):n/a DVT prophylaxis: SCDs GI prophylaxis: PPI Glucose control: SSI Mobility: Bedrest Disposition: Neuro ICU for evolving stroke  Goals of Care:    Family Updates: Per primary  ATTESTATION & SIGNATURE  The patient is critically ill due to acute stroke, impending respiratory failure, hypertensive emergency.  Critical care was necessary to treat or prevent imminent or life-threatening deterioration.  Critical care was time spent personally by me on the following activities: development of treatment plan with patient and/or surrogate as well as  nursing, discussions with consultants, evaluation of patient's response to treatment, examination of patient, obtaining history from patient or surrogate, ordering and performing treatments and interventions, ordering and review of laboratory studies, ordering and review of radiographic studies, pulse oximetry, re-evaluation of patient's condition and participation in multidisciplinary rounds.   Critical Care Time devoted to patient care services described in this note is 32 minutes. This time reflects time of care of this signee 3/26 . This critical care time does not reflect separately billable procedures or procedure time, teaching time or supervisory time of PA/NP/Med student/Med Resident etc but could involve care discussion time.       Charlott Holler Pulmonary and Critical Care Medicine 06/16/2020 1:53 PM  Pager: see AMION After hours pager: [phone]  If no response to pager , please call [phone] until 7pm After 7:00 pm call Elink  [phone]      LABS    PULMONARY Recent Labs  Lab 06/15/20 1937  TCO2 23    CBC Recent Labs  Lab 06/15/20 1934 06/15/20 1937 06/16/20 0021  HGB 13.0 13.6 13.2  HCT 39.7 40.0 38.0*  WBC 10.4  --  19.7*  PLT  228  --  239    COAGULATION Recent Labs  Lab 06/15/20 1934  INR 1.0    CARDIAC  No results for input(s): TROPONINI in the last 168 hours. No results for input(s): PROBNP in the last 168 hours.   CHEMISTRY Recent Labs  Lab 06/15/20 1934 06/15/20 1937 06/16/20 0021  NA 136 139 134*  K 3.9 4.0 4.0  CL 103 104 103  CO2 22  --  22  GLUCOSE 141* 137* 164*  BUN 18 20 20   CREATININE 1.12 1.00 1.08  CALCIUM 8.9  --  8.7*  MG  --   --  2.3  PHOS  --   --  2.8   Estimated Creatinine Clearance: 53.9 mL/min (by C-G formula based on SCr of 1.08 mg/dL).   LIVER Recent Labs  Lab 06/15/20 1934 06/16/20 0021  AST 24 30  ALT 23 23  ALKPHOS 44 76  BILITOT 0.8 0.4  PROT 7.0 7.0  ALBUMIN 4.1  4.0  INR 1.0  --      INFECTIOUS Recent Labs  Lab 06/16/20 0021  LATICACIDVEN 1.9  PROCALCITON <0.10     ENDOCRINE CBG (last 3)  Recent Labs    06/16/20 0336 06/16/20 0819 06/16/20 1227  GLUCAP 162* 140* 109*

## 2020-06-16 NOTE — H&P (Signed)
Please see consult note from same date for H&P

## 2020-06-17 ENCOUNTER — Inpatient Hospital Stay (HOSPITAL_COMMUNITY): Payer: Medicare Other

## 2020-06-17 DIAGNOSIS — I611 Nontraumatic intracerebral hemorrhage in hemisphere, cortical: Secondary | ICD-10-CM | POA: Diagnosis not present

## 2020-06-17 DIAGNOSIS — I161 Hypertensive emergency: Secondary | ICD-10-CM | POA: Diagnosis not present

## 2020-06-17 DIAGNOSIS — I6389 Other cerebral infarction: Secondary | ICD-10-CM

## 2020-06-17 LAB — CBC WITH DIFFERENTIAL/PLATELET
Abs Immature Granulocytes: 0.11 10*3/uL — ABNORMAL HIGH (ref 0.00–0.07)
Basophils Absolute: 0 10*3/uL (ref 0.0–0.1)
Basophils Relative: 0 %
Eosinophils Absolute: 0 10*3/uL (ref 0.0–0.5)
Eosinophils Relative: 0 %
HCT: 35.6 % — ABNORMAL LOW (ref 39.0–52.0)
Hemoglobin: 12.6 g/dL — ABNORMAL LOW (ref 13.0–17.0)
Immature Granulocytes: 1 %
Lymphocytes Relative: 8 %
Lymphs Abs: 1.4 10*3/uL (ref 0.7–4.0)
MCH: 33.7 pg (ref 26.0–34.0)
MCHC: 35.4 g/dL (ref 30.0–36.0)
MCV: 95.2 fL (ref 80.0–100.0)
Monocytes Absolute: 1.6 10*3/uL — ABNORMAL HIGH (ref 0.1–1.0)
Monocytes Relative: 9 %
Neutro Abs: 14.3 10*3/uL — ABNORMAL HIGH (ref 1.7–7.7)
Neutrophils Relative %: 82 %
Platelets: 230 10*3/uL (ref 150–400)
RBC: 3.74 MIL/uL — ABNORMAL LOW (ref 4.22–5.81)
RDW: 12.9 % (ref 11.5–15.5)
WBC: 17.4 10*3/uL — ABNORMAL HIGH (ref 4.0–10.5)
nRBC: 0 % (ref 0.0–0.2)

## 2020-06-17 LAB — BASIC METABOLIC PANEL
Anion gap: 11 (ref 5–15)
BUN: 24 mg/dL — ABNORMAL HIGH (ref 8–23)
CO2: 22 mmol/L (ref 22–32)
Calcium: 8.5 mg/dL — ABNORMAL LOW (ref 8.9–10.3)
Chloride: 102 mmol/L (ref 98–111)
Creatinine, Ser: 1.23 mg/dL (ref 0.61–1.24)
GFR, Estimated: 58 mL/min — ABNORMAL LOW (ref >60)
Glucose, Bld: 145 mg/dL — ABNORMAL HIGH (ref 70–99)
Potassium: 4 mmol/L (ref 3.5–5.1)
Sodium: 135 mmol/L (ref 135–145)

## 2020-06-17 LAB — ECHOCARDIOGRAM COMPLETE
Area-P 1/2: 4.41 cm2
Calc EF: 61.7 %
Height: 68 in
S' Lateral: 3 cm
Single Plane A2C EF: 63.7 %
Single Plane A4C EF: 58.2 %
Weight: 2977.09 oz

## 2020-06-17 LAB — GLUCOSE, CAPILLARY
Glucose-Capillary: 123 mg/dL — ABNORMAL HIGH (ref 70–99)
Glucose-Capillary: 132 mg/dL — ABNORMAL HIGH (ref 70–99)
Glucose-Capillary: 138 mg/dL — ABNORMAL HIGH (ref 70–99)
Glucose-Capillary: 138 mg/dL — ABNORMAL HIGH (ref 70–99)
Glucose-Capillary: 149 mg/dL — ABNORMAL HIGH (ref 70–99)
Glucose-Capillary: 155 mg/dL — ABNORMAL HIGH (ref 70–99)

## 2020-06-17 LAB — PROCALCITONIN: Procalcitonin: <0.1 ng/mL

## 2020-06-17 LAB — PHOSPHORUS: Phosphorus: 4.1 mg/dL (ref 2.5–4.6)

## 2020-06-17 LAB — TRIGLYCERIDES: Triglycerides: 324 mg/dL — ABNORMAL HIGH (ref <150)

## 2020-06-17 MED ORDER — CARVEDILOL 12.5 MG PO TABS
25.0000 mg | ORAL_TABLET | Freq: Two times a day (BID) | ORAL | Status: DC
  Administered 2020-06-18 – 2020-07-03 (×30): 25 mg
  Filled 2020-06-17 (×31): qty 2

## 2020-06-17 MED ORDER — AMLODIPINE BESYLATE 10 MG PO TABS
10.0000 mg | ORAL_TABLET | Freq: Every day | ORAL | Status: DC
  Administered 2020-06-18 – 2020-07-03 (×16): 10 mg
  Filled 2020-06-17 (×16): qty 1

## 2020-06-17 NOTE — Consult Note (Signed)
NAME:  Calvin Yates, MRN:  [id number], DOB:  1934/12/31, LOS: 2 ADMISSION DATE:  06/15/2020, CONSULTATION DATE:  06/15/20 REFERRING MD:  Dr Iver Nestle - neuro, CHIEF COMPLAINT:   - Acute encephalopathy due to ICH  Brief History:  Acute encephalopathy due to ICH  History of Present Illness: Obtained from [facility] neuro hospitalist.  Patient unable to give history  86 year old male with very minimal past medical history.  Ate dinner with his wife at 1730 and having a conversation till 1800 hrs.  Abruptly at 1815 was found to be down.  EMS on arrival found his blood pressure to be high systolic 200s.  He had an episode of emesis on route and transiently hypoxemic.  But in the ER for the 90 minutes prior to CCM evaluation saturation 95% on room air and no further emesis.  He is drowsy but following commands with hemiparesis on the left side.  7 cm right intracranial hemorrhage frontoparietal region hemorrhage without midline shift.  Patient's not intubated.  He has a hard collar on because of his fall.  C-spine CT is pending.  CCM called because of high risk of worsening encephalopathy and respiratory failure   Significant Hospital Events:  06/15/2020 - admit   Consults:  06/15/2020  - ccm  Procedures:  x  Significant Diagnostic Tests:  06/15/2020 - CT HEAD:  IMPRESSION: 1. Right frontoparietal parenchymal hemorrhage measuring 7.6 cm. Sub-5 mm left temporal hemorrhage. 2. No significant midline shift or ventricular entrapment. 3. Right greater than left cerebral convexity subarachnoid blood products. 4. ASPECTS is 10.   Micro Data:  x  Antimicrobials:  x   Interim History / Subjective:   No overnight issues. Still with eyes closed but will follow commands. Still on high dose cleviprex.   Objective   Blood pressure 129/70, pulse 91, temperature 99.2 F (37.3 C), temperature source Axillary, resp. rate 19, height 5\' 8"  (1.727 m), weight 84.4 kg, SpO2 93 %.        Intake/Output  Summary (Last 24 hours) at 06/17/2020 1153 Last data filed at 06/17/2020 0900 Gross per 24 hour  Intake 1114.29 ml  Output 300 ml  Net 814.29 ml   Filed Weights   06/15/20 2001 06/16/20 0000 06/16/20 0600  Weight: 81.3 kg 84.4 kg 84.4 kg    Examination: General: Stable looking male in a hard c-collar lying in the ER bed HENT: in C-collar. Left sided facial droop Lungs: No respiratory distress Cardiovascular: RRR Abdomen: Soft nontender no organomegaly Extremities: Intact able to wiggle his right toes on command, no edema Neuro: somnolent but arousable and follows commands.   Labs personally reviewed - Na 134, K 4.0, Cr 1.08, Hgb 13.2  Resolved Hospital Problem list   x  Assessment & Plan:   Hypertensive emergency associated with intracranial hemorrhage Continue cleviprex - nursing tried placing NG tube to start oral medications, not sucessful - will need to have cortrak placement Monday to help wean cleviprex.   High risk for acute respiratory failure requiring intubation due to large intracranial hemorrhage and associated acute encephalopathy - currently protective airway, repeat head CT stable - will continue to monitor for ongoing respiratory failure/distress  Acute encephalopathy - 7 cm right intracranial hemorrhage at admission 06/15/2020  Concern for C Spine injury - neurochecks, neuroprotective measures  Best practice (evaluated daily)  Diet: N.p.o. due to aspiration risk, failed SLP eval on 2/26 and again on 2/27 Pain/Anxiety/Delirium protocol (if indicated): Currently none VAP protocol (if indicated):n/a DVT prophylaxis: SCDs GI  prophylaxis: PPI Glucose control: SSI Mobility: Bedrest Disposition: Neuro ICU for evolving stroke, hypertensive crisis  Goals of Care:   Family Updates: Per primary  ATTESTATION & SIGNATURE   The patient is critically ill due to hypertensive emergency, stroke.  Critical care was necessary to treat or prevent imminent or  life-threatening deterioration.  Critical care was time spent personally by me on the following activities: development of treatment plan with patient and/or surrogate as well as nursing, discussions with consultants, evaluation of patient's response to treatment, examination of patient, obtaining history from patient or surrogate, ordering and performing treatments and interventions, ordering and review of laboratory studies, ordering and review of radiographic studies, pulse oximetry, re-evaluation of patient's condition and participation in multidisciplinary rounds.   Critical Care Time devoted to patient care services described in this note is 33 minutes. This time reflects time of care of this signee Charlott Holler . This critical care time does not reflect separately billable procedures or procedure time, teaching time or supervisory time of PA/NP/Med student/Med Resident etc but could involve care discussion time.       Charlott Holler [facility] 06/17/2020 11:53 AM  Pager: see AMION After hours pager: [phone]  If no response to pager , please call [phone] until 7pm After 7:00 pm call Elink  [phone]    LABS    PULMONARY Recent Labs  Lab 06/15/20 1937  TCO2 23    CBC Recent Labs  Lab 06/15/20 1934 06/15/20 1937 06/16/20 0021 06/17/20 0526  HGB 13.0 13.6 13.2 12.6*  HCT 39.7 40.0 38.0* 35.6*  WBC 10.4  --  19.7* 17.4*  PLT 228  --  239 230    COAGULATION Recent Labs  Lab 06/15/20 1934  INR 1.0    CARDIAC  No results for input(s): TROPONINI in the last 168 hours. No results for input(s): PROBNP in the last 168 hours.   CHEMISTRY Recent Labs  Lab 06/15/20 1934 06/15/20 1937 06/16/20 0021 06/17/20 0526  NA 136 139 134* 135  K 3.9 4.0 4.0 4.0  CL 103 104 103 102  CO2 22  --  22 22  GLUCOSE 141* 137* 164* 145*  BUN 18 20 20  24*  CREATININE 1.12 1.00 1.08 1.23  CALCIUM 8.9  --  8.7* 8.5*  MG  --   --  2.3   --   PHOS  --   --  2.8 4.1   Estimated Creatinine Clearance: 47.3 mL/min (by C-G formula based on SCr of 1.23 mg/dL).   LIVER Recent Labs  Lab 06/15/20 1934 06/16/20 0021  AST 24 30  ALT 23 23  ALKPHOS 44 76  BILITOT 0.8 0.4  PROT 7.0 7.0  ALBUMIN 4.1 4.0  INR 1.0  --      INFECTIOUS Recent Labs  Lab 06/16/20 0021 06/17/20 0526  LATICACIDVEN 1.9  --   PROCALCITON <0.10 <0.10     ENDOCRINE CBG (last 3)  Recent Labs    06/16/20 2342 06/17/20 0425 06/17/20 0849  GLUCAP 134* 155* 123*

## 2020-06-17 NOTE — Progress Notes (Signed)
  Speech Language Pathology Treatment: Dysphagia  Patient Details Name: Calvin Yates MRN: [id number] DOB: 1935-02-05 Today's Date: 06/17/2020 Time: 1194-1740 SLP Time Calculation (min) (ACUTE ONLY): 8 min  Assessment / Plan / Recommendation Clinical Impression  Pt was seen for skilled ST targeting PO trials.  Pt was slightly more alert on this date.  He was seen with one trial of a small ice chip. He accepted it into his oral cavity, but then exhibited bolus holding with no attempts at swallow initiation.  Immediate cough was noted following this trial.  No additional PO trials were attempted on this date.  SLP completed oral care following PO trial.  Recommend continuation of NPO at this time with consideration of short-term alternative means of nutrition and medications administered via alternative means.  SLP will continue to f/u to determine readiness for clinical diet initiation vs instrumental swallow study.    HPI HPI: Calvin Yates is a 86 y.o. male with no significant history who presents to the ED via EMS for Code Stroke.  EMS noted patient to have slurred speech and L-sided weakness. Brain MRI reported right frontoparietal parenchymal hemorrhage and left temporal hemorrhage.      SLP Plan  Continue with current plan of care       Recommendations  Diet recommendations: NPO Medication Administration: Via alternative means                Oral Care Recommendations: Oral care QID;Staff/trained caregiver to provide oral care Follow up Recommendations: Other (comment) (TBD) SLP Visit Diagnosis: Dysphagia, unspecified (R13.10) Plan: Continue with current plan of care       GO               Villa Herb., M.S., CCC-SLP Acute Rehabilitation Services Office: [phone]  Shanon Rosser [facility] 06/17/2020, 11:15 AM

## 2020-06-17 NOTE — Evaluation (Signed)
Speech Language Pathology Evaluation Patient Details Name: Calvin Yates MRN: [id number] DOB: August 02, 1934 Today's Date: 06/17/2020 Time: 5621-3086 SLP Time Calculation (min) (ACUTE ONLY): 12 min  Problem List:  Patient Active Problem List   Diagnosis Date Noted  . ICH (intracerebral hemorrhage) (HCC) 06/15/2020   Past Medical History: No past medical history on file. Past Surgical History:  Past Surgical History:  Procedure Laterality Date  . APPENDECTOMY    . FINGER SURGERY     HPI:  Calvin Yates is a 86 y.o. male with no significant history who presents to the ED via EMS for Code Stroke.  EMS noted patient to have slurred speech and L-sided weakness. Brain MRI reported right frontoparietal parenchymal hemorrhage and left temporal hemorrhage.   Assessment / Plan / Recommendation Clinical Impression  Pt was seen for a cognitive-linguistic evaluation in the setting of bilateral hemorrhages.  Evaluation was significantly abbreviated secondary to lethargy.  Pt intermittently roused to mod-max verbal and tactile stimulation.  He was oriented to himself, but not to place, date, or situation.  His speech intelligibility was reduced at the word level, with suspected dysarthria.  Difficult to fully assess given lethargy.  Pt additionally exhibited receptive language deficits c/b difficulty following single step commands.  Pt's daughter reported that the pt was fully independent and was caring for his wife (who has suspected dementia) prior to this admission.  SLP will f/u for diagnostic treatment.    SLP Assessment  SLP Visit Diagnosis: Dysphagia, unspecified (R13.10)    Follow Up Recommendations  Other (comment) (TBD)    Frequency and Duration min 2x/week  2 weeks      SLP Evaluation Cognition  Overall Cognitive Status: Impaired/Different from baseline Arousal/Alertness: Lethargic Orientation Level: Oriented to person;Disoriented to place;Disoriented to time;Disoriented to  situation Attention: Sustained Sustained Attention: Impaired Sustained Attention Impairment: Functional basic       Comprehension  Auditory Comprehension Overall Auditory Comprehension: Impaired Commands: Impaired One Step Basic Commands: 25-49% accurate    Expression Expression Primary Mode of Expression: Verbal   Oral / Motor  Oral Motor/Sensory Function Overall Oral Motor/Sensory Function: Other (comment) (Unable to assess) Motor Speech Overall Motor Speech: Impaired Respiration: Within functional limits Articulation: Impaired Level of Impairment: Word Intelligibility: Intelligibility reduced Word: 50-74% accurate   GO                   Villa Herb M.S., CCC-SLP Acute Rehabilitation Services Office: [phone]  Shanon Rosser [facility] 06/17/2020, 11:34 AM

## 2020-06-17 NOTE — Progress Notes (Signed)
  Echocardiogram 2D Echocardiogram has been performed.  Calvin Yates 06/17/2020, 9:02 AM

## 2020-06-18 ENCOUNTER — Inpatient Hospital Stay (HOSPITAL_COMMUNITY): Payer: Medicare Other

## 2020-06-18 DIAGNOSIS — R531 Weakness: Secondary | ICD-10-CM | POA: Diagnosis not present

## 2020-06-18 DIAGNOSIS — E785 Hyperlipidemia, unspecified: Secondary | ICD-10-CM

## 2020-06-18 DIAGNOSIS — I619 Nontraumatic intracerebral hemorrhage, unspecified: Secondary | ICD-10-CM | POA: Diagnosis not present

## 2020-06-18 DIAGNOSIS — I611 Nontraumatic intracerebral hemorrhage in hemisphere, cortical: Secondary | ICD-10-CM | POA: Diagnosis not present

## 2020-06-18 DIAGNOSIS — I680 Cerebral amyloid angiopathy: Secondary | ICD-10-CM | POA: Diagnosis not present

## 2020-06-18 LAB — CBC WITH DIFFERENTIAL/PLATELET
Abs Immature Granulocytes: 0.14 10*3/uL — ABNORMAL HIGH (ref 0.00–0.07)
Basophils Absolute: 0.1 10*3/uL (ref 0.0–0.1)
Basophils Relative: 0 %
Eosinophils Absolute: 0 10*3/uL (ref 0.0–0.5)
Eosinophils Relative: 0 %
HCT: 37.8 % — ABNORMAL LOW (ref 39.0–52.0)
Hemoglobin: 13 g/dL (ref 13.0–17.0)
Immature Granulocytes: 1 %
Lymphocytes Relative: 10 %
Lymphs Abs: 2 10*3/uL (ref 0.7–4.0)
MCH: 33.6 pg (ref 26.0–34.0)
MCHC: 34.4 g/dL (ref 30.0–36.0)
MCV: 97.7 fL (ref 80.0–100.0)
Monocytes Absolute: 2.3 10*3/uL — ABNORMAL HIGH (ref 0.1–1.0)
Monocytes Relative: 11 %
Neutro Abs: 16.6 10*3/uL — ABNORMAL HIGH (ref 1.7–7.7)
Neutrophils Relative %: 78 %
Platelets: 220 10*3/uL (ref 150–400)
RBC: 3.87 MIL/uL — ABNORMAL LOW (ref 4.22–5.81)
RDW: 12.8 % (ref 11.5–15.5)
WBC: 21.2 10*3/uL — ABNORMAL HIGH (ref 4.0–10.5)
nRBC: 0 % (ref 0.0–0.2)

## 2020-06-18 LAB — BASIC METABOLIC PANEL
Anion gap: 13 (ref 5–15)
BUN: 29 mg/dL — ABNORMAL HIGH (ref 8–23)
CO2: 18 mmol/L — ABNORMAL LOW (ref 22–32)
Calcium: 8.5 mg/dL — ABNORMAL LOW (ref 8.9–10.3)
Chloride: 104 mmol/L (ref 98–111)
Creatinine, Ser: 1.17 mg/dL (ref 0.61–1.24)
GFR, Estimated: >60 mL/min (ref >60)
Glucose, Bld: 141 mg/dL — ABNORMAL HIGH (ref 70–99)
Potassium: 4.6 mmol/L (ref 3.5–5.1)
Sodium: 135 mmol/L (ref 135–145)

## 2020-06-18 LAB — GLUCOSE, CAPILLARY
Glucose-Capillary: 139 mg/dL — ABNORMAL HIGH (ref 70–99)
Glucose-Capillary: 142 mg/dL — ABNORMAL HIGH (ref 70–99)
Glucose-Capillary: 145 mg/dL — ABNORMAL HIGH (ref 70–99)
Glucose-Capillary: 152 mg/dL — ABNORMAL HIGH (ref 70–99)
Glucose-Capillary: 152 mg/dL — ABNORMAL HIGH (ref 70–99)
Glucose-Capillary: 156 mg/dL — ABNORMAL HIGH (ref 70–99)

## 2020-06-18 LAB — PHOSPHORUS: Phosphorus: 3.3 mg/dL (ref 2.5–4.6)

## 2020-06-18 LAB — MAGNESIUM: Magnesium: 2.6 mg/dL — ABNORMAL HIGH (ref 1.7–2.4)

## 2020-06-18 LAB — TRIGLYCERIDES: Triglycerides: 190 mg/dL — ABNORMAL HIGH (ref <150)

## 2020-06-18 LAB — PROCALCITONIN: Procalcitonin: 0.13 ng/mL

## 2020-06-18 MED ORDER — PROSOURCE TF PO LIQD
45.0000 mL | Freq: Two times a day (BID) | ORAL | Status: DC
  Administered 2020-06-18 – 2020-07-03 (×31): 45 mL
  Filled 2020-06-18 (×31): qty 45

## 2020-06-18 MED ORDER — ADULT MULTIVITAMIN W/MINERALS CH
1.0000 | ORAL_TABLET | Freq: Every day | ORAL | Status: DC
  Administered 2020-06-18 – 2020-07-03 (×16): 1
  Filled 2020-06-18 (×16): qty 1

## 2020-06-18 MED ORDER — ATORVASTATIN CALCIUM 80 MG PO TABS: 80.0000 mg | ORAL_TABLET | Freq: Every day | ORAL | Status: DC

## 2020-06-18 MED ORDER — HYDRALAZINE HCL 20 MG/ML IJ SOLN
20.0000 mg | Freq: Four times a day (QID) | INTRAMUSCULAR | Status: DC | PRN
  Administered 2020-06-20 – 2020-06-21 (×3): 20 mg via INTRAVENOUS
  Filled 2020-06-18 (×3): qty 1

## 2020-06-18 MED ORDER — OSMOLITE 1.5 CAL PO LIQD
1000.0000 mL | ORAL | Status: DC
  Administered 2020-06-18 – 2020-07-03 (×14): 1000 mL
  Filled 2020-06-18 (×8): qty 1000

## 2020-06-18 MED ORDER — ENOXAPARIN SODIUM 40 MG/0.4ML ~~LOC~~ SOLN
40.0000 mg | Freq: Every day | SUBCUTANEOUS | Status: DC
  Administered 2020-06-18 – 2020-07-03 (×16): 40 mg via SUBCUTANEOUS
  Filled 2020-06-18 (×16): qty 0.4

## 2020-06-18 MED ORDER — PANTOPRAZOLE SODIUM 40 MG PO PACK
40.0000 mg | PACK | Freq: Every day | ORAL | Status: DC
  Administered 2020-06-18 – 2020-07-02 (×15): 40 mg
  Filled 2020-06-18 (×15): qty 20

## 2020-06-18 MED ORDER — SENNOSIDES-DOCUSATE SODIUM 8.6-50 MG PO TABS
1.0000 | ORAL_TABLET | Freq: Two times a day (BID) | ORAL | Status: DC
  Administered 2020-06-18 – 2020-07-02 (×21): 1
  Filled 2020-06-18 (×23): qty 1

## 2020-06-18 MED ORDER — SODIUM CHLORIDE 0.9 % IV SOLN
INTRAVENOUS | Status: DC | PRN
  Administered 2020-06-18: 250 mL via INTRAVENOUS
  Administered 2020-06-19: 500 mL via INTRAVENOUS

## 2020-06-18 MED ORDER — LABETALOL HCL 5 MG/ML IV SOLN
20.0000 mg | INTRAVENOUS | Status: DC | PRN
  Administered 2020-06-19 – 2020-06-21 (×8): 20 mg via INTRAVENOUS
  Filled 2020-06-18 (×9): qty 4

## 2020-06-18 MED ORDER — SODIUM CHLORIDE 0.9 % IV SOLN
1.0000 g | INTRAVENOUS | Status: AC
  Administered 2020-06-18 – 2020-06-22 (×5): 1 g via INTRAVENOUS
  Filled 2020-06-18 (×5): qty 1
  Filled 2020-06-18: qty 10

## 2020-06-18 NOTE — Progress Notes (Signed)
STROKE TEAM PROGRESS NOTE   INTERVAL HISTORY No acute events Family at bedside. Cervical collar in place.  Patient opens his eyes to voice, dysconjugate gaze, follows commands on the right (thumbs up and wiggles toes). He moves the right side freely antigravity, at least 4/5.  LU weak WD, LLE triple flexion.   Blood pressure adequately controlled but remains on Cleviprex drip.  Plan is to place core track tube today for tube feeding Vitals:   06/18/20 0630 06/18/20 0700 06/18/20 0730 06/18/20 0800  BP: (!) 135/54 (!) 136/51 (!) 150/55   Pulse: 97 100 (!) 109   Resp: 20 (!) 23 20   Temp:    99.8 F (37.7 C)  TempSrc:    Axillary  SpO2: 93% 91% 92%   Weight:      Height:       CBC:  Recent Labs  Lab 06/17/20 0526 06/18/20 0257  WBC 17.4* 21.2*  NEUTROABS 14.3* 16.6*  HGB 12.6* 13.0  HCT 35.6* 37.8*  MCV 95.2 97.7  PLT 230 220   Basic Metabolic Panel:  Recent Labs  Lab 06/16/20 0021 06/17/20 0526 06/18/20 0257  NA 134* 135 135  K 4.0 4.0 4.6  CL 103 102 104  CO2 22 22 18*  GLUCOSE 164* 145* 141*  BUN 20 24* 29*  CREATININE 1.08 1.23 1.17  CALCIUM 8.7* 8.5* 8.5*  MG 2.3  --   --   PHOS 2.8 4.1 3.3   Lipid Panel:  Recent Labs  Lab 06/16/20 0021 06/17/20 0526 06/18/20 0257  CHOL 195  --   --   TRIG 312*   < > 190*  HDL 30*  --   --   CHOLHDL 6.5  --   --   VLDL 62*  --   --   LDLCALC 103*  --   --    < > = values in this interval not displayed.   HgbA1c:  Recent Labs  Lab 06/15/20 1941  HGBA1C 5.6   Urine Drug Screen:  Recent Labs  Lab 06/15/20 2037  LABOPIA NONE DETECTED  COCAINSCRNUR NONE DETECTED  LABBENZ NONE DETECTED  AMPHETMU NONE DETECTED  THCU NONE DETECTED  LABBARB NONE DETECTED    Alcohol Level No results for input(s): ETH in the last 168 hours.  IMAGING past 24 hours ECHOCARDIOGRAM COMPLETE  Result Date: 06/17/2020 IMPRESSIONS   1. Left ventricular ejection fraction, by estimation, is 70 to 75%. The left ventricle has  hyperdynamic function. The left ventricle has no regional wall motion abnormalities. Left ventricular diastolic parameters are consistent with Grade I diastolic dysfunction (impaired relaxation).   2. Right ventricular systolic function is normal. The right ventricular size is normal. Tricuspid regurgitation signal is inadequate for assessing PA pressure.   3. The mitral valve is normal in structure. Trivial mitral valve regurgitation. No evidence of mitral stenosis.   4. The aortic valve is tricuspid. Aortic valve regurgitation is not visualized. Mild aortic valve sclerosis is present, with no evidence of aortic valve stenosis.   5. The inferior vena cava is normal in size with greater than 50% respiratory variability, suggesting right atrial pressure of 3 mmHg.    IMAGING past 24 hours MR BRAIN W WO CONTRAST  Result Date: 06/15/2020 CLINICAL DATA:   IMPRESSION: 1. Innumerable foci of hemosiderin deposition scattered throughout the brain consistent with previous hemorrhagic infarctions. Intraparenchymal hematoma at the right frontoparietal junction with internal clot retraction, measuring 7.8 x 4.7 x 4.6 cm. No abnormal enhancement to suggest  that this represents a hemorrhagic mass. Small hemorrhage in the left temporal lobe cannot be specifically identified by MRI as different than the other foci of hemosiderin deposition. 2. Small amount of subarachnoid hemorrhage within the sulci. 3. Mild mass-effect upon the right lateral ventricle with right-to-left shift 3 mm. Chronic small-vessel ischemic changes of the white matter elsewhere. The overall pattern could either be due to amyloid angiopathy or hypertensive vascular disease. 4. No finding to suggest the presence metastatic disease. Electronically Signed   By: Paulina Fusi M.D.   On: 06/15/2020 22:56   DG CHEST PORT 1 VIEW  Result Date: 06/15/2020 CLINICAL DATA:  Unresponsive. EXAM: PORTABLE CHEST 1 VIEW COMPARISON:  None. FINDINGS: The heart  size and mediastinal contours are within normal limits. Both lungs are clear. The visualized skeletal structures are unremarkable. IMPRESSION: No active disease. Electronically Signed   By: Lupita Raider M.D.   On: 06/15/2020 20:29   CT HEAD CODE STROKE WO CONTRAST  Result Date: 06/15/2020 IMPRESSION:  1. Right frontoparietal parenchymal hemorrhage measuring 7.6 cm. Sub-5 mm left temporal hemorrhage.  2. No significant midline shift or ventricular entrapment.  3. Right greater than left cerebral convexity subarachnoid blood products.  4. ASPECTS is 10.   PHYSICAL EXAM Physical Exam Constitutional: Appears well-developed and well-nourished. Hard cervical collar in place.  Psych: Affect appropriate to situation,flat but cooperative Eyes: No scleral injection HENT: No oropharyngeal obstruction, c-collar in place.  MSK: no joint deformities.  Cardiovascular: Normal rateto slight tachycardiaand regular rhythm.  Respiratory:Rhonchorous breath sounds GI: Soft. No distension. There is no tenderness.  Skin: Warm dry and intact visible skin  Neuro: Mental Status: Patient isdrowsy but opens eyes to voice,  follows simple midline and one-step commands Cranial Nerves: II: Visual Fields exam shows diminished blink to the left suggestive of a left hemianopia. Pupils are equal, round, and reactive to light.3 to 2 mm bilaterally III,IV, NW:GNFAO gaze preference, can track to midline V: Facial sensation isslightly reduced to eyelash brush on the left VII: Facial movement isnotable for a left facial droop.  VIII: hearing is intact to voice X: Uvuladifficult to visualize XI: unable to test due to cooperation XII: tongue is midline without atrophy or fasciculations.  Motor: Tone isincreased in the left upper extremity and lower extremity. Bulk is normal.He moves the right side freely antigravity, at least 4/5. On the left upper weak WD, left lower triple  flexion Sensory: Sensation appears reduced in the left arm and leg Deep Tendon Reflexes: 2+ and symmetric in the biceps and patellae. Plantars: Toes are upgoing on the left, down on the right   ASSESSMENT/PLAN  86 year old male with very minimal past medical history. Ate dinner with his wife at 1730 and having a conversationtill 1800 hrs. Abruptly at 1815 was found to be down. EMS on arrival found his blood pressure to be high systolic 200s with slurred speech and left sided weakness. He had an episode of emesis on route and was transiently hypoxemic.  Multifocal intracerebral hemorrhage, largest in the right MCA territory with associated right MCA syndrome, cerebral edema and brain compression .  Head CT with a large(greater than 30 cc, 7.6 x 4.7largest axial dimensions)intraparenchymal hemorrhage of the right insula with surrounding edema and mass-effect on the lateral ventricle without intraventricular extension. There is also a small left temporal punctate hyperdensity concerning for hemorrhage and subarachnoid blood bilaterally, right greater than left   CTA head & neck  Stable appearance of 7.7 cm right frontoparietal hemorrhage. 2-3  mm leftward midline shift, unchanged. Sub 5 mm left temporal hemorrhage and bilateral subarachnoid blood products, unchanged.   CTA neck: No high-grade narrowing or large vessel occlusion within the neck. Less than 50% narrowing of the left CCA and bilateral proximal ICAs.   CTA head: No large vessel occlusion. High-grade distal right V4 segment narrowing. Mild to moderate right M2, right ICA terminus, distal basilar artery and bilateral PCA narrowing.   Cervical spine CT: No acute fracture or traumatic listhesis. Multilevel spondylosis most prominent at the C3-6 levels.   MRI  Innumerable foci of hemosiderin deposition scattered throughout the brain consistent with previous hemorrhagic infarctions.Intraparenchymal  hematoma at the right frontoparietal junction with internal clot retraction, measuring 7.8 x 4.7 x 4.6 cm. No abnormal enhancement to suggest that this represents a hemorrhagic mass. Small hemorrhage in the left temporal lobe cannot be specifically identified by MRI as different than the other foci of hemosiderin deposition.Small amount of subarachnoid hemorrhage within the sulci. Mild mass-effect upon the right lateral ventricle with right-to-left shift 3 mm. Chronic small-vessel ischemic changes of the white matter elsewhere. The overall pattern could either be due to amyloid angiopathy or hypertensive vascular disease.   2D Echo: EF 70-75%, Grade I diastolic dysfunction  LDL 103  HgbA1c 5.6  VTE prophylaxis - Start Lovenox 40mg  daily  Recommended NPO per SLP.   Hold Anticoagulant/Antiplatelet medications in setting of IPH, SAH  No AC/AP prior to admission  CT head in am to evaluate hemorrhage, cerebral edema, and shift  Therapy recommendations:  TBD pending stabilization  Disposition:  TBD  Hypertension  Home meds: none, no hx of HTN but with BP 200s on EMS arrival  Change SBP goal to <160   Currently on Norvasc 10mg  daily, coreg 25mg  bid  Wean off cleviprex infusion  Labetalol 20mg  q2hr prn, hydralazine 20mg  q6hr prn  Long-term BP goal normotensive  Hyperlipidemia  Home meds:  None   LDL 103, goal < 70  high intensity statin at discharge  Continue statin at discharge  Other Stroke Risk Factors  Advanced Age >/= 4765   Overweight, Body mass index is 28.29 kg/m., recommend weight loss, diet and exercise as appropriate   Dysphagia  Secondary to stroke  SLP following  Cortrak placement  Tube feeds  Other Problems: Concern for C Spine injury  CCM to clear cervical collar   CT cervical spine: No acute fracture or traumatic listhesis.  No acute fracture or traumatic listhesis.  ? NSTEMI/At risk for MI Cardiac monitoring  Troponin  30  Aspiration Pneumonia  Febrile: Tmax   Leukocytosis: WBC 19.7 -> 21.2   Xray: bibasilar opacities  On Ceftriaxone 1g daily  Blood cultures (2/26): no growth to date   Hospital day # 3 I have personally obtained history,examined this patient, reviewed notes, independently viewed imaging studies, participated in medical decision making and plan of care.ROS completed by me personally and pertinent positives fully documented  I have made any additions or clarifications directly to the above note. Agree with note above. . Continue close neurological monitoring and strict blood pressure control with systolic goal below 160.  Wean Cleviprex drip and add oral blood pressure medications to core track tube and use as needed IV labetalol and hydralazine to achieve blood pressure goal.  Start tube feeds after core track tube placement.  Start Lovenox for DVT prophylaxis.  Repeat head CT in the a.m.  Discussed with critical care team nurse practitioner.This patient is critically ill and at significant risk of  neurological worsening, death and care requires constant monitoring of vital signs, hemodynamics,respiratory and cardiac monitoring, extensive review of multiple databases, frequent neurological assessment, discussion with family, other specialists and medical decision making of high complexity.I have made any additions or clarifications directly to the above note.This critical care time does not reflect procedure time, or teaching time or supervisory time of PA/NP/Med Resident etc but could involve care discussion time.  I spent 30 minutes of neurocritical care time  in the care of  this patient.      Delia Heady, MD Medical Director [facility] Pager: [phone] 06/18/2020 3:48 PM   To contact Stroke Continuity provider, please refer to [email or web address]. After hours, contact General Neurology

## 2020-06-18 NOTE — Procedures (Signed)
Cortrak  Person Inserting Tube:  Renie Ora, RD Tube Type:  Cortrak - 43 inches Tube Location:  Left nare Initial Placement:  Stomach Secured by: Bridle Technique Used to Measure Tube Placement:  Documented cm marking at nare/ corner of mouth Cortrak Secured At:  70 cm Procedure Comments:  Cortrak Tube Team Note:  Consult received to place a Cortrak feeding tube.   No x-ray is required. RN may begin using tube.    If the tube becomes dislodged please keep the tube and contact the Cortrak team at [email or web address] ([email or web address]) for replacement.  If after hours and replacement cannot be delayed, place a NG tube and confirm placement with an abdominal x-ray.      Trenton Gammon, MS, RD, LDN, CNSC Inpatient Clinical Dietitian RD pager # available in AMION  After hours/weekend pager # available in [facility]

## 2020-06-18 NOTE — Progress Notes (Signed)
PT Cancellation Note  Patient Details Name: Dailon Sheeran MRN: [id number] DOB: [birth year]   Cancelled Treatment:    Reason Eval/Treat Not Completed: Medical issues which prohibited therapy today as pt with ongoing respiratory distress and may require intubation. Will continue to follow and progress with PT POC as able.   Rolm Baptise, PT, DPT   Acute Rehabilitation Department Pager #: [phone]   Gaetana Michaelis 06/18/2020, 6:14 PM

## 2020-06-18 NOTE — Progress Notes (Signed)
SLP Cancellation Note  Patient Details Name: Maureen Duesing MRN: [id number] DOB: 01-16-1935   Cancelled treatment:        Critical care, NP outside room reported pt not appropriate presently- respiratory distress and may require intubation. Will follow. Cortrak has been placed   [name] 06/18/2020, 10:53 AM   [name].Ed Nurse, children's [phone] Office [phone]

## 2020-06-18 NOTE — Progress Notes (Signed)
NAME:  Calvin Yates, MRN:  [id number], DOB:  02-Nov-1934, LOS: 3 ADMISSION DATE:  06/15/2020, CONSULTATION DATE:  06/15/20 REFERRING MD:  Dr Iver Nestle - neuro, CHIEF COMPLAINT:   - Acute encephalopathy due to ICH  Brief History:  Acute encephalopathy due to ICH  History of Present Illness: Obtained from [facility] neuro hospitalist.  Patient unable to give history  86 year old male with very minimal past medical history.  Ate dinner with his wife at 1730 and having a conversation till 1800 hrs.  Abruptly at 1815 was found to be down.  EMS on arrival found his blood pressure to be high systolic 200s.  He had an episode of emesis on route and transiently hypoxemic.  But in the ER for the 90 minutes prior to CCM evaluation saturation 95% on room air and no further emesis.  He is drowsy but following commands with hemiparesis on the left side.  7 cm right intracranial hemorrhage frontoparietal region hemorrhage without midline shift.  Patient's not intubated.  He has a hard collar on because of his fall.  C-spine CT is pending.  CCM called because of high risk of worsening encephalopathy and respiratory failure  Significant Hospital Events:  06/15/2020 - admit  Consults:  06/15/2020  - ccm  Procedures:  x  Significant Diagnostic Tests:   CT HEAD 2/25 > Right frontoparietal parenchymal hemorrhage measuring 7.6 cm. Sub-5 mm left temporal hemorrhage. No significant midline shift or ventricular entrapment. Right greater than left cerebral convexity subarachnoid blood products.   Micro Data:  x  Antimicrobials:  x   Interim History / Subjective:  Seen lying in bed in mild respiratory distress  Increasing supplemental oxygen needs overnight  Able to follow simple commands   Objective   Blood pressure (!) 135/54, pulse 97, temperature 99.4 F (37.4 C), temperature source Axillary, resp. rate 20, height 5\' 8"  (1.727 m), weight 84.4 kg, SpO2 93 %.        Intake/Output Summary (Last 24 hours) at  06/18/2020 0706 Last data filed at 06/18/2020 0600 Gross per 24 hour  Intake 1332.83 ml  Output 770 ml  Net 562.83 ml   Filed Weights   06/15/20 2001 06/16/20 0000 06/16/20 0600  Weight: 81.3 kg 84.4 kg 84.4 kg    Examination: General: Acute ill appearing elderly male lying in bed HEENT: [place]/AT, MM pink/moist, PERRL,  Neuro: Alert to self only, able to follow simple commands, left upper extremities strength 0/5 CV: s1s2 regular rate and rhythm, no murmur, rubs, or gallops,  PULM:  Bilateral expiratory wheeze, oxygen saturations 91-95 on 4L [place], mild accessory muscle use and tachypnea  GI: soft, bowel sounds active in all 4 quadrants, non-tender, non-distended Extremities: warm/dry, no edema  Skin: no rashes or lesions  Resolved Hospital Problem list     Assessment & Plan:   High risk for acute respiratory failure requiring intubation due to large intracranial hemorrhage and associated acute encephalopathy -Morning o f2/28 patient is seen with tachypnea, increasing supplemental oxygen need, and mild accessory muscle use  -Wife updated and agreeable to intubation if/when needed  Concern for aspiration pneumonia -Vomited in route to hospital, worsening leukocytosis  P: Close monitoring of ability to protect airway, rapidly approaching need for support   Head of bed elevated 30 degrees Follow intermittent chest x-ray and ABG.   Ensure adequate pulmonary hygiene  Follow cultures  Obtain procal  PRN BDs If intubated obtain sputum culture and CXR Low threshold to initiate antibiotic therapy   Hypertensive  emergency associated with intracranial hemorrhage -B/P per EMS was systolicly in the 200s P: Continue Cleviprex  Close monitoring of hemodynamics in the ICU setting  If intubated place OG/Cortrack for PO meds   Large right frontoparietal intracranial hemorrhage  -Seen on CT stroke on arrival, 2-31mm left midline shift  Acute encephalopathy  - 7 cm right intracranial  hemorrhage at admission 06/15/2020  Concern for C Spine injury -CT spine with no acute fracture or traumatic listhesis. P: Management per neurology  Maintain neuro protective measures; goal for eurothermia, euglycemia, eunatermia, normoxia, and PCO2 goal of 35-40 Nutrition and bowel regiment  Seizure precautions  Aspirations precautions    Best practice (evaluated daily)  Diet: N.p.o. due to aspiration risk, failed SLP eval on 2/26 and again on 2/27 Pain/Anxiety/Delirium protocol (if indicated): Currently none VAP protocol (if indicated):n/a DVT prophylaxis: SCDs GI prophylaxis: PPI Glucose control: SSI Mobility: Bedrest Disposition: Neuro ICU for evolving stroke, hypertensive crisis  Goals of Care:   Family Updates: Per primary   LABS    PULMONARY Recent Labs  Lab 06/15/20 1937  TCO2 23    CBC Recent Labs  Lab 06/16/20 0021 06/17/20 0526 06/18/20 0257  HGB 13.2 12.6* 13.0  HCT 38.0* 35.6* 37.8*  WBC 19.7* 17.4* 21.2*  PLT 239 230 220    COAGULATION Recent Labs  Lab 06/15/20 1934  INR 1.0    CARDIAC  No results for input(s): TROPONINI in the last 168 hours. No results for input(s): PROBNP in the last 168 hours.   CHEMISTRY Recent Labs  Lab 06/15/20 1934 06/15/20 1937 06/16/20 0021 06/17/20 0526 06/18/20 0257  NA 136 139 134* 135 135  K 3.9 4.0 4.0 4.0 4.6  CL 103 104 103 102 104  CO2 22  --  22 22 18*  GLUCOSE 141* 137* 164* 145* 141*  BUN 18 20 20  24* 29*  CREATININE 1.12 1.00 1.08 1.23 1.17  CALCIUM 8.9  --  8.7* 8.5* 8.5*  MG  --   --  2.3  --   --   PHOS  --   --  2.8 4.1 3.3   Estimated Creatinine Clearance: 49.7 mL/min (by C-G formula based on SCr of 1.17 mg/dL).   LIVER Recent Labs  Lab 06/15/20 1934 06/16/20 0021  AST 24 30  ALT 23 23  ALKPHOS 44 76  BILITOT 0.8 0.4  PROT 7.0 7.0  ALBUMIN 4.1 4.0  INR 1.0  --      INFECTIOUS Recent Labs  Lab 06/16/20 0021 06/17/20 0526  LATICACIDVEN 1.9  --   PROCALCITON  <0.10 <0.10     ENDOCRINE CBG (last 3)  Recent Labs    06/17/20 2020 06/17/20 2352 06/18/20 0348  GLUCAP 149* 132* 142*     Critical Care:   Performed by: 06/20/20  Total critical care time: 40 minutes  Critical care time was exclusive of separately billable procedures and treating other patients.  Critical care was necessary to treat or prevent imminent or life-threatening deterioration.  Critical care was time spent personally by me on the following activities: development of treatment plan with patient and/or surrogate as well as nursing, discussions with consultants, evaluation of patient's response to treatment, examination of patient, obtaining history from patient or surrogate, ordering and performing treatments and interventions, ordering and review of laboratory studies, ordering and review of radiographic studies, pulse oximetry and re-evaluation of patient's condition.  Delfin Gant, NP-C [facility] Personal contact information can be found  on Amion  If no response please page: Adult pulmonary and critical care medicine pager on Amion unitl 7pm After 7pm please call [phone] 06/18/2020, 7:14 AM

## 2020-06-18 NOTE — Progress Notes (Signed)
Initial Nutrition Assessment  DOCUMENTATION CODES:   Not applicable  INTERVENTION:   Initiate tube feeding via Cortrak tube: Osmolite 1.5 at 55 ml/h (1320 ml per day) Prosource TF 45 ml BID  Provides 2060 kcal, 104 gm protein, 1003 ml free water daily   NUTRITION DIAGNOSIS:   Inadequate oral intake related to inability to eat as evidenced by NPO status.  GOAL:   Patient will meet greater than or equal to 90% of their needs  MONITOR:   TF tolerance  REASON FOR ASSESSMENT:   Consult Enteral/tube feeding initiation and management  ASSESSMENT:   Pt with no PMH admitted after being found down at home with multifocal ICHs with largest in the R MCA likely hypertensive.    Pt discussed during ICU rounds and with RN.  Pt on 4L of O2, mouth breathing, per MD may need intubation.  Pt unable to provide any history. Wife and daughter in the room and provide history. 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  Per family pt is independent, has a good appetite, and has not had any weight loss PTA.  Pt eats cereal for breakfast and per daughter pt and wife eat frozen dinners for supper. He does not drink any supplements at home.  Noted pt with muscle depletions, pt is at risk of malnutrition.   2/26 failed swallow eval  2/28 cortrak placed; tip gastric   Medications reviewed and include: SSI, senokot-s Cleviprex @ 64 ml/hr  provides: 3072 kcal from lipid  Labs reviewed: TG: 190 CBG's: 132-152 (NPO)    UOP: 770 ml   NUTRITION - FOCUSED PHYSICAL EXAM:  Flowsheet Row Most Recent Value  Orbital Region No depletion  Upper Arm Region No depletion  Thoracic and Lumbar Region No depletion  Buccal Region Mild depletion  Temple Region Mild depletion  Clavicle Bone Region No depletion  Clavicle and Acromion Bone Region No depletion  Scapular Bone Region Unable to assess  Dorsal Hand No depletion  Patellar Region Moderate depletion  Anterior  Thigh Region Moderate depletion  Posterior Calf Region Mild depletion  Edema (RD Assessment) None  Hair Reviewed  Eyes Unable to assess  Mouth Unable to assess  Skin Reviewed  [pt hot to touch]  Nails Reviewed       Diet Order:   Diet Order            Diet NPO time specified  Diet effective now                 EDUCATION NEEDS:   No education needs have been identified at this time  Skin:  Skin Assessment: Reviewed RN Assessment  Last BM:  unknown  Height:   Ht Readings from Last 1 Encounters:  06/16/20 5\' 8"  (1.727 m)    Weight:   Wt Readings from Last 1 Encounters:  06/16/20 84.4 kg    Ideal Body Weight:  70 kg  BMI:  Body mass index is 28.29 kg/m.  Estimated Nutritional Needs:   Kcal:  1900-2100  Protein:  100-115 grams  Fluid:  >1.9 L/day  06/18/20., RD, LDN, CNSC See AMiON for contact information

## 2020-06-19 ENCOUNTER — Inpatient Hospital Stay: Payer: Self-pay

## 2020-06-19 ENCOUNTER — Inpatient Hospital Stay (HOSPITAL_COMMUNITY): Payer: Medicare Other

## 2020-06-19 DIAGNOSIS — R509 Fever, unspecified: Secondary | ICD-10-CM

## 2020-06-19 DIAGNOSIS — I611 Nontraumatic intracerebral hemorrhage in hemisphere, cortical: Secondary | ICD-10-CM | POA: Diagnosis not present

## 2020-06-19 DIAGNOSIS — R0902 Hypoxemia: Secondary | ICD-10-CM

## 2020-06-19 DIAGNOSIS — I610 Nontraumatic intracerebral hemorrhage in hemisphere, subcortical: Secondary | ICD-10-CM | POA: Diagnosis not present

## 2020-06-19 LAB — CBC WITH DIFFERENTIAL/PLATELET
Abs Immature Granulocytes: 0.09 10*3/uL — ABNORMAL HIGH (ref 0.00–0.07)
Basophils Absolute: 0.1 10*3/uL (ref 0.0–0.1)
Basophils Relative: 0 %
Eosinophils Absolute: 0 10*3/uL (ref 0.0–0.5)
Eosinophils Relative: 0 %
HCT: 32.9 % — ABNORMAL LOW (ref 39.0–52.0)
Hemoglobin: 11.5 g/dL — ABNORMAL LOW (ref 13.0–17.0)
Immature Granulocytes: 1 %
Lymphocytes Relative: 12 %
Lymphs Abs: 1.5 10*3/uL (ref 0.7–4.0)
MCH: 33.1 pg (ref 26.0–34.0)
MCHC: 35 g/dL (ref 30.0–36.0)
MCV: 94.8 fL (ref 80.0–100.0)
Monocytes Absolute: 1.7 10*3/uL — ABNORMAL HIGH (ref 0.1–1.0)
Monocytes Relative: 14 %
Neutro Abs: 8.8 10*3/uL — ABNORMAL HIGH (ref 1.7–7.7)
Neutrophils Relative %: 73 %
Platelets: 175 10*3/uL (ref 150–400)
RBC: 3.47 MIL/uL — ABNORMAL LOW (ref 4.22–5.81)
RDW: 12.8 % (ref 11.5–15.5)
WBC: 12.2 10*3/uL — ABNORMAL HIGH (ref 4.0–10.5)
nRBC: 0 % (ref 0.0–0.2)

## 2020-06-19 LAB — BASIC METABOLIC PANEL
Anion gap: 9 (ref 5–15)
BUN: 35 mg/dL — ABNORMAL HIGH (ref 8–23)
CO2: 24 mmol/L (ref 22–32)
Calcium: 8.1 mg/dL — ABNORMAL LOW (ref 8.9–10.3)
Chloride: 106 mmol/L (ref 98–111)
Creatinine, Ser: 0.97 mg/dL (ref 0.61–1.24)
GFR, Estimated: >60 mL/min (ref >60)
Glucose, Bld: 136 mg/dL — ABNORMAL HIGH (ref 70–99)
Potassium: 3.9 mmol/L (ref 3.5–5.1)
Sodium: 139 mmol/L (ref 135–145)

## 2020-06-19 LAB — GLUCOSE, CAPILLARY
Glucose-Capillary: 127 mg/dL — ABNORMAL HIGH (ref 70–99)
Glucose-Capillary: 132 mg/dL — ABNORMAL HIGH (ref 70–99)
Glucose-Capillary: 132 mg/dL — ABNORMAL HIGH (ref 70–99)
Glucose-Capillary: 133 mg/dL — ABNORMAL HIGH (ref 70–99)
Glucose-Capillary: 147 mg/dL — ABNORMAL HIGH (ref 70–99)
Glucose-Capillary: 148 mg/dL — ABNORMAL HIGH (ref 70–99)

## 2020-06-19 LAB — PHOSPHORUS: Phosphorus: 2.3 mg/dL — ABNORMAL LOW (ref 2.5–4.6)

## 2020-06-19 LAB — MAGNESIUM: Magnesium: 2.5 mg/dL — ABNORMAL HIGH (ref 1.7–2.4)

## 2020-06-19 LAB — SODIUM
Sodium: 141 mmol/L (ref 135–145)
Sodium: 146 mmol/L — ABNORMAL HIGH (ref 135–145)

## 2020-06-19 MED ORDER — POTASSIUM & SODIUM PHOSPHATES 280-160-250 MG PO PACK
2.0000 | PACK | ORAL | Status: AC
  Administered 2020-06-19 (×2): 2
  Filled 2020-06-19 (×2): qty 2

## 2020-06-19 MED ORDER — SODIUM CHLORIDE 0.9% FLUSH: 10.0000 mL | INTRAVENOUS | Status: DC | PRN

## 2020-06-19 MED ORDER — SODIUM CHLORIDE 3 % IV SOLN
INTRAVENOUS | Status: DC
  Filled 2020-06-19 (×3): qty 500

## 2020-06-19 MED ORDER — SODIUM CHLORIDE 23.4 % INJECTION (4 MEQ/ML) FOR IV ADMINISTRATION
120.0000 meq | Freq: Once | INTRAVENOUS | Status: AC
  Administered 2020-06-19: 120 meq via INTRAVENOUS
  Filled 2020-06-19: qty 30

## 2020-06-19 MED ORDER — GUAIFENESIN 100 MG/5ML PO SOLN
5.0000 mL | Freq: Two times a day (BID) | ORAL | Status: DC
  Administered 2020-06-19 – 2020-07-03 (×29): 100 mg
  Filled 2020-06-19 (×2): qty 15
  Filled 2020-06-19 (×2): qty 5
  Filled 2020-06-19 (×5): qty 15
  Filled 2020-06-19 (×2): qty 5
  Filled 2020-06-19: qty 15
  Filled 2020-06-19: qty 5
  Filled 2020-06-19 (×15): qty 15
  Filled 2020-06-19: qty 5

## 2020-06-19 MED ORDER — SODIUM CHLORIDE 0.9% FLUSH
10.0000 mL | Freq: Two times a day (BID) | INTRAVENOUS | Status: DC
  Administered 2020-06-19 – 2020-07-05 (×32): 10 mL

## 2020-06-19 MED ORDER — GUAIFENESIN 100 MG/5ML PO SOLN: 5.0000 mL | Freq: Two times a day (BID) | ORAL | Status: DC

## 2020-06-19 MED ORDER — BISACODYL 10 MG RE SUPP: 10.0000 mg | Freq: Every day | RECTAL | Status: DC | PRN

## 2020-06-19 NOTE — Progress Notes (Signed)
Peripherally Inserted Central Catheter Placement  The IV Nurse has discussed with the patient and/or persons authorized to consent for the patient, the purpose of this procedure and the potential benefits and risks involved with this procedure.  The benefits include less needle sticks, lab draws from the catheter, and the patient may be discharged home with the catheter. Risks include, but not limited to, infection, bleeding, blood clot (thrombus formation), and puncture of an artery; nerve damage and irregular heartbeat and possibility to perform a PICC exchange if needed/ordered by physician.  Alternatives to this procedure were also discussed.  Bard Power PICC patient education guide, fact sheet on infection prevention and patient information card has been provided to patient /or left at bedside.  Phone consent obtain from wife Fallou Hulbert  [facility] Placement Documentation  PICC Double Lumen 06/19/20 PICC Right Basilic 40 cm 0 cm (Active)  Indication for Insertion or Continuance of Line Vasoactive infusions 06/19/20 1247  Exposed Catheter (cm) 0 cm 06/19/20 1247  Site Assessment Clean;Dry;Intact 06/19/20 1247  Lumen #1 Status Flushed;Saline locked;Blood return noted 06/19/20 1247  Lumen #2 Status Flushed;Saline locked;Blood return noted 06/19/20 1247  Dressing Type Transparent 06/19/20 1247  Dressing Status Clean;Dry;Intact 06/19/20 1247  Antimicrobial disc in place? Yes 06/19/20 1247  Dressing Intervention New dressing 06/19/20 1247  Dressing Change Due 06/26/20 06/19/20 1247       Aramis, Weil 06/19/2020, 12:48 PM

## 2020-06-19 NOTE — Progress Notes (Addendum)
NAME:  Calvin Yates, MRN:  [id number], DOB:  [birth year], LOS: 4 ADMISSION DATE:  06/15/2020, CONSULTATION DATE:  06/15/20 REFERRING MD:  Dr Iver Nestle - neuro, CHIEF COMPLAINT:   - Acute encephalopathy due to ICH  Brief History:  Acute encephalopathy due to ICH  History of Present Illness: Obtained from [facility] neuro hospitalist.  Patient unable to give history  86 year old male with very minimal past medical history.  Ate dinner with his wife at 1730 and having a conversation till 1800 hrs.  Abruptly at 1815 was found to be down.  EMS on arrival found his blood pressure to be high systolic 200s.  He had an episode of emesis on route and transiently hypoxemic.  But in the ER for the 90 minutes prior to CCM evaluation saturation 95% on room air and no further emesis.  He is drowsy but following commands with hemiparesis on the left side.  7 cm right intracranial hemorrhage frontoparietal region hemorrhage without midline shift.  Patient's not intubated.  He has a hard collar on because of his fall.  C-spine CT is pending.  CCM called because of high risk of worsening encephalopathy and respiratory failure  Significant Hospital Events:  06/15/2020 - admit  Consults:  06/15/2020  - ccm  Procedures:    Significant Diagnostic Tests:   CT HEAD 2/25 > Right frontoparietal parenchymal hemorrhage measuring 7.6 cm. Sub-5 mm left temporal hemorrhage. No significant midline shift or ventricular entrapment. Right greater than left cerebral convexity subarachnoid blood products.  CXR 06/18/2020       Interval placement of feeding tube that extends       below the diaphragm into the stomach. New bibasilar opacities may be       consistent with atelectasis but could also be consistent with       aspiration pneumonia given clinical history. No overt edema, pleural       fluid or pneumothorax.       CT Head wo contrast 06/19/2020         Slight enlargement in the parenchymal hemorrhage in the right          frontal parietal region as described. Minimal midline shift from         right to left is noted.          Stable small parenchymal hemorrhage in the left temporal lobe         unchanged from the prior exam.   Micro Data:  06/18/2020 Respiratory Culture>>  GS: ABUNDANT GRAM POSITIVE COCCI  ABUNDANT GRAM NEGATIVE RODS  MODERATE GRAM POSITIVE RODS Culture Pending>>  Antimicrobials:  Rocephin 06/18/2020>>  Interim History / Subjective:  Seen lying in bed in mild respiratory distress Remains on 4 L [place] with sats of 98%  Able to follow simple commands, seems agitated, moving right arm in repetitive motion   T Max 100.6, WBC 12.2 Mag 2.5 Creatinine 0.97 Platelets 175 ( From 220 on 2/28) Repeat CT Head 3/1 pending Off Cleviprex TF at goal Weak cough and thick secretions per nursing + 2400, UO 1000 last 24 hours Objective   Blood pressure (!) 154/61, pulse 91, temperature 99.1 F (37.3 C), temperature source Axillary, resp. rate (!) 24, height 5\' 8"  (1.727 m), weight 84.4 kg, SpO2 98 %.        Intake/Output Summary (Last 24 hours) at 06/19/2020 0806 Last data filed at 06/19/2020 0600 Gross per 24 hour  Intake 1612.73 ml  Output 1000 ml  Net 612.73 ml  Filed Weights   06/15/20 2001 06/16/20 0000 06/16/20 0600  Weight: 81.3 kg 84.4 kg 84.4 kg    Examination: General: Acute ill appearing elderly male lying in bed, eyes closed HEENT: [place]/AT, MM pink/moist, PERRL,  Neuro: Alert to self only, able to follow simple commands, left upper extremities strength 0/5, Pupils 2-3 mm, reactive to light CV: s1s2 regular rate and rhythm, no murmur, rubs, or gallops,  PULM:  Bilateral expiratory wheeze, oxygen saturations 91-95 on 4L [place], mild accessory muscle use and tachypnea  GI: soft, bowel sounds active in all 4 quadrants, non-tender, non-distended Extremities: warm/dry, no edema  Skin: no rashes or lesions  Resolved Hospital Problem list     Assessment & Plan:   High risk for acute  respiratory failure requiring intubation due to large intracranial hemorrhage and associated acute encephalopathy -Morning of 3/1, RR is 19, oxygen at 4 L [place], and mild accessory muscle use  -Wife updated and agreeable to intubation if/when needed  Concern for aspiration pneumonia>> ABX initiated 2/28 -Vomited in route to hospital, worsening leukocytosis  Procal 0.13 on 2/28 Sputum Cx pending, GS noted Weak cough>> remains high risk for intubation P: Close monitoring of ability to protect airway, monitor for need for mechanical  support   Head of bed elevated 30 degrees Follow intermittent chest x-ray and ABG.   Ensure adequate pulmonary hygiene  Follow cultures >> Sputum GS noted Continue Rocephin, await culture results and sensitivities Trend WBC and Fever curve PRN BDs Will add robitussin to thin secretions   Hypertensive emergency associated with intracranial hemorrhage Slight enlargement in the parenchymal hemorrhage with minimal R>L shify noted 3/1 repeat CT head -B/P per EMS was systolicly in the 200s P: Off Cleviprex  Close monitoring of hemodynamics in the ICU setting  Cor Track placed 2/28 Started po antihypertensives 2/28  Large right frontoparietal intracranial hemorrhage  -Seen on CT stroke on arrival, 2-42mm left midline shift  Acute encephalopathy  - 7 cm right intracranial hemorrhage at admission 06/15/2020  Concern for C Spine injury -CT spine with no acute fracture or traumatic listhesis.>> cleared P: Management per neurology  Maintain neuro protective measures; goal for eurothermia, euglycemia, eunatermia, normoxia, and PCO2 goal of 35-40 Nutrition and bowel regiment  Seizure precautions  Aspirations precautions    Platelets 175, 000 Slow down trend over last several days Plan Trend CBC Monitor for bruising. bleeding  Best practice (evaluated daily)  Diet: N.p.o. due to aspiration risk, failed SLP eval on 2/26 and again on 2/27 Pain/Anxiety/Delirium  protocol (if indicated): Currently none VAP protocol (if indicated):n/a DVT prophylaxis: SCDs GI prophylaxis: PPI Glucose control: SSI Mobility: Bedrest Disposition: Neuro ICU for evolving stroke, hypertensive crisis  Goals of Care:   Family Updates: Per primary   LABS    PULMONARY Recent Labs  Lab 06/15/20 1937  TCO2 23    CBC Recent Labs  Lab 06/17/20 0526 06/18/20 0257 06/19/20 0446  HGB 12.6* 13.0 11.5*  HCT 35.6* 37.8* 32.9*  WBC 17.4* 21.2* 12.2*  PLT 230 220 175    COAGULATION Recent Labs  Lab 06/15/20 1934  INR 1.0    CARDIAC  No results for input(s): TROPONINI in the last 168 hours. No results for input(s): PROBNP in the last 168 hours.   CHEMISTRY Recent Labs  Lab 06/15/20 1934 06/15/20 1937 06/16/20 0021 06/17/20 0526 06/18/20 0257 06/18/20 1616 06/19/20 0446  NA 136 139 134* 135 135  --  139  K 3.9 4.0 4.0 4.0 4.6  --  3.9  CL 103 104 103 102 104  --  106  CO2 22  --  22 22 18*  --  24  GLUCOSE 141* 137* 164* 145* 141*  --  136*  BUN 18 20 20  24* 29*  --  35*  CREATININE 1.12 1.00 1.08 1.23 1.17  --  0.97  CALCIUM 8.9  --  8.7* 8.5* 8.5*  --  8.1*  MG  --   --  2.3  --   --  2.6* 2.5*  PHOS  --   --  2.8 4.1 3.3  --  2.3*   Estimated Creatinine Clearance: 60 mL/min (by C-G formula based on SCr of 0.97 mg/dL).   LIVER Recent Labs  Lab 06/15/20 1934 06/16/20 0021  AST 24 30  ALT 23 23  ALKPHOS 44 76  BILITOT 0.8 0.4  PROT 7.0 7.0  ALBUMIN 4.1 4.0  INR 1.0  --      INFECTIOUS Recent Labs  Lab 06/16/20 0021 06/17/20 0526 06/18/20 0257  LATICACIDVEN 1.9  --   --   PROCALCITON <0.10 <0.10 0.13     ENDOCRINE CBG (last 3)  Recent Labs    06/18/20 1953 06/18/20 2347 06/19/20 0352  GLUCAP 139* 145* 148*     Critical Care:   Performed by: 08/19/20  Total critical care time: 35 minutes  Critical care time was exclusive of separately billable procedures and treating other patients.  Critical care  was necessary to treat or prevent imminent or life-threatening deterioration.  Critical care was time spent personally by me on the following activities: development of treatment plan with patient and/or surrogate as well as nursing, discussions with consultants, evaluation of patient's response to treatment, examination of patient, obtaining history from patient or surrogate, ordering and performing treatments and interventions, ordering and review of laboratory studies, ordering and review of radiographic studies, pulse oximetry and re-evaluation of patient's condition.    Bevelyn Ngo, MSN, AGACNP-BC [facility] Adult pulmonary and critical care medicine pager on Amion unitl 7pm After 7pm please call [phone] 06/19/2020, 8:06 AM

## 2020-06-19 NOTE — Progress Notes (Signed)
Transitions of Care Team following this patient for discharge planning.  Currently, patient not medically stable; will continue to follow as patient progresses.    Naeemah Jasmer LCSW  

## 2020-06-19 NOTE — Progress Notes (Signed)
Physical Therapy Treatment Patient Details Name: Calvin Yates MRN: [id number] DOB: 10-02-1934 Today's Date: 06/19/2020    History of Present Illness The pt is an 86 yo male presenting with elevated BP and tachycardia after being found down by his wife. 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    PT Comments    Despite keeping eyes closed pt able to follow simple commands on R UE and LE. Pt continues to require total assist for transfers. Pt remains unsafe to attempt to stand, will need use of maxi sky for transfer OOB to chair. Acute PT to cont to follow.   Follow Up Recommendations  CIR     Equipment Recommendations   (TBD)    Recommendations for Other Services Rehab consult     Precautions / Restrictions Precautions Precautions: Fall Precaution Comments: pt c-spine cleared, no need for c-collar Restrictions Weight Bearing Restrictions: No    Mobility  Bed Mobility Overal bed mobility: Needs Assistance Bed Mobility: Rolling;Sidelying to Sit;Sit to Supine Rolling: Max assist;+2 for physical assistance Sidelying to sit: Total assist;+2 for physical assistance   Sit to supine: Total assist;+2 for physical assistance   General bed mobility comments: pt with retropulsion requiring assist at hip flexors to promote hip flexion to sit pt up at EOB    Transfers                 General transfer comment: unable at this time as pt requiring total assist to sit EOB  Ambulation/Gait             General Gait Details: unable   Stairs             Wheelchair Mobility    Modified Rankin (Stroke Patients Only) Modified Rankin (Stroke Patients Only) Pre-Morbid Rankin Score: No symptoms Modified Rankin: Severe disability     Balance Overall balance assessment: Needs assistance Sitting-balance support: Feet supported;Single extremity supported Sitting balance-Leahy Scale: Zero Sitting balance - Comments:  pt unable to use L UE to support self in sitting, when R UE used to support pt pushes to the L, pt unable to maintain midline position without total assist posteriorly Postural control: Posterior lean;Left lateral lean                                  Cognition Arousal/Alertness: Lethargic Behavior During Therapy: Restless;Impulsive (pt restless with R UE) Overall Cognitive Status: Impaired/Different from baseline Area of Impairment: Following commands;Awareness;Problem solving                       Following Commands: Follows one step commands with increased time (pt consistent with simple commands on R UE and R LE but unable to initiate transfers or use L UE or LE)   Awareness:  (L inattention) Problem Solving: Slow processing;Decreased initiation;Difficulty sequencing;Requires verbal cues;Requires tactile cues General Comments: pt consistently following commands with R UE and LE. Pt able to state name, hospital and he had a stroke, however when asked to sit up straight or to tranfer to EOB pt unable to do so      Exercises General Exercises - Lower Extremity Ankle Circles/Pumps: AROM;Right;10 reps;Seated (passive with L foot) Long Arc Quad: AROM;Right;10 reps;Seated (AA on L) Other Exercises Other Exercises: AA with R UE shld flexion x10 reps Other Exercises: passive L shoulder ROM x 10 reps    General Comments  General comments (skin integrity, edema, etc.): VSS, pt's IV came out in R hand, RN aware and came to assess      Pertinent Vitals/Pain Pain Assessment: Faces Faces Pain Scale: No hurt Pain Location: no sign of discomfort during treatment Pain Descriptors / Indicators: Discomfort;Grimacing Pain Intervention(s): Monitored during session    Home Living                      Prior Function            PT Goals (current goals can now be found in the care plan section) Acute Rehab PT Goals Patient Stated Goal: none stated Progress  towards PT goals: Not progressing toward goals - comment    Frequency    Min 4X/week      PT Plan Current plan remains appropriate    Co-evaluation              AM-PAC PT "6 Clicks" Mobility   Outcome Measure  Help needed turning from your back to your side while in a flat bed without using bedrails?: Total Help needed moving from lying on your back to sitting on the side of a flat bed without using bedrails?: Total Help needed moving to and from a bed to a chair (including a wheelchair)?: Total Help needed standing up from a chair using your arms (e.g., wheelchair or bedside chair)?: Total Help needed to walk in hospital room?: Total Help needed climbing 3-5 steps with a railing? : Total 6 Click Score: 6    End of Session Equipment Utilized During Treatment: Oxygen Activity Tolerance: Patient limited by lethargy Patient left: in bed;with call bell/phone within reach;with bed alarm set;with family/visitor present Nurse Communication: Mobility status PT Visit Diagnosis: Hemiplegia and hemiparesis Hemiplegia - Right/Left: Left Hemiplegia - dominant/non-dominant: Non-dominant Hemiplegia - caused by: Nontraumatic intracerebral hemorrhage     Time: 1041-1104 PT Time Calculation (min) (ACUTE ONLY): 23 min  Charges:  $Therapeutic Activity: 8-22 mins $Neuromuscular Re-education: 8-22 mins                     Lewis Shock, PT, DPT Acute Rehabilitation Services Pager #: [phone] Office #: [phone]    Iona Hansen 06/19/2020, 12:30 PM

## 2020-06-19 NOTE — Progress Notes (Signed)
  Speech Language Pathology Treatment: Dysphagia;Cognitive-Linquistic  Patient Details Name: Calvin Yates MRN: [id number] DOB: 08/01/1934 Today's Date: 06/19/2020 Time: 8850-2774 SLP Time Calculation (min) (ACUTE ONLY): 23 min  Assessment / Plan / Recommendation Clinical Impression  Calvin Yates is lethargic, responds to questions with severe dysarthria intermittently. He is unable to manage secretions effectively resulting in significant volume of dried blood covering soft palate and partial hard palate further contributing to dysarthria. Debris removed during extended period debridement with toothette, [place], ice chip and tweezers. Recommend RN continue frequent oral care and if adequately alert give 1-2 ice chips to mitigate drying of oral mucosa.  He is oriented to hospital, follows simple commands keeping eyes closed. Speech intelligibility improved slightly after oral care. Lethargy is primary barrier to abilities presently. Recommend inpatient CIR.     HPI HPI: Calvin Yates is a 86 y.o. male with no significant history who presents to the ED via EMS for Code Stroke.  EMS noted patient to have slurred speech and L-sided weakness. Brain MRI reported right frontoparietal parenchymal hemorrhage and left temporal hemorrhage.      SLP Plan  Continue with current plan of care       Recommendations  Diet recommendations: NPO;Other(comment) (ice chips if alert (1-2)) Medication Administration: Via alternative means                General recommendations: Rehab consult Oral Care Recommendations: Oral care QID;Oral care prior to ice chip/H20 Follow up Recommendations: Inpatient Rehab SLP Visit Diagnosis: Dysphagia, unspecified (R13.10);Cognitive communication deficit (R41.841);Dysarthria and anarthria (R47.1) Plan: Continue with current plan of care       GO                [name] 06/19/2020, 9:54 AM

## 2020-06-19 NOTE — Consult Note (Signed)
Physical Medicine and Rehabilitation Consult Reason for Consult: Left side weakness Referring Physician: Dr. Pearlean BrownieSethi   HPI: Calvin Yates is a 86 y.o. right-handed male with unremarkable past medical history on no prescription medications.  Per chart review patient lives with spouse.  1 level home 4 steps to entry.  Patient's wife has documented dementia and patient is primary caregiver.  Presented 06/16/2020 with fall and acute onset of left side weakness.  Noted blood pressure in the 200s as well as tachycardic.  He had an episode of emesis on route with some hypoxia to the high 80s placed on a nonrebreather mask.  CT cervical spine no fracture or traumatic listhesis.  CT of the head showed right frontal parietal parenchymal hemorrhage measuring 7.6 cm.  Sub-5 mm left temporal hemorrhage.  No significant mass shift or ventricular entrapment.  Right greater than left cerebral convexity subarachnoid blood products.  MRI innumerable foci of hemosiderin deposition scattered throughout the brain consistent with previous hemorrhagic infarctions.  Intraparenchymal hematoma at the right frontal parietal junction with internal clot retraction measuring 7.8 x 4.7 x 4.6 cm.  No abnormal enhancement.  Small amount of subarachnoid hemorrhage within the sulci.  Mild mass-effect upon the right lateral ventricle with right to left shift of 3 mm.  CT angiogram of the head and neck showed no large vessel occlusion.  Echocardiogram with ejection fraction of 70 to 75% no wall motion abnormalities grade 1 diastolic dysfunction.  Maintained on Cleviprex drip for blood pressure control.  Latest follow-up CT scan of the head 06/19/2020 showed slight enlargement in the parenchymal hemorrhage in the right frontal parietal region.  Stable small parenchymal hemorrhage in the left temporal lobe unchanged from prior exam.  Patient was placed on 3% saline.  He was cleared to begin Lovenox for DVT prophylaxis 06/18/2020.  He currently  remains n.p.o. with alternative means of nutritional support.  Therapy evaluations completed with recommendations of physical medicine rehab consult. He opens eyes but is unable to participate in exam otherwise.    Review of Systems  Unable to perform ROS: Acuity of condition   No past medical history on file. Past Surgical History:  Procedure Laterality Date  . APPENDECTOMY    . FINGER SURGERY     Family History  Problem Relation Age of Onset  . Hypertension Mother   . Hypercholesterolemia Mother   . Heart disease Mother    Social History:  reports that he has never smoked. He has never used smokeless tobacco. He reports previous alcohol use. He reports that he does not use drugs. Allergies:  Allergies  Allergen Reactions  . Amoxicillin Rash   Medications Prior to Admission  Medication Sig Dispense Refill  . acetaminophen (TYLENOL) 500 MG tablet Take 500 mg by mouth in the morning.    . fluticasone (FLONASE) 50 MCG/ACT nasal spray Place 1 spray into both nostrils daily. 16 g 0    Home: Home Living Family/patient expects to be discharged to:: Private residence Living Arrangements: Spouse/significant other Available Help at Discharge: Family Type of Home: House Home Access: Stairs to enter Secretary/administratorntrance Stairs-Number of Steps: 4 Home Layout: One level Bathroom Shower/Tub: Stage managerTub/shower unit,Walk-in shower Bathroom Toilet: Standard Home Equipment: None Additional Comments: pt lives with wife who has dementia (pt is primary caretaker)  Lives With: Spouse  Functional History: Prior Function Level of Independence: Independent Comments: driving, 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 Functional Status:  Mobility: Bed Mobility Overal bed mobility: Needs Assistance Bed Mobility:  Rolling,Sidelying to Sit,Sit to Supine Rolling: Max assist,+2 for physical assistance Sidelying to sit: Total assist,+2 for physical assistance Sit to supine: Total assist,+2 for physical assistance General  bed mobility comments: pt with retropulsion requiring assist at hip flexors to promote hip flexion to sit pt up at EOB Transfers General transfer comment: unable at this time as pt requiring total assist to sit EOB Ambulation/Gait General Gait Details: unable    ADL: ADL Overall ADL's : Needs assistance/impaired Eating/Feeding: NPO Grooming: Total assistance Upper Body Bathing: Total assistance Lower Body Bathing: Total assistance Upper Body Dressing : Total assistance Lower Body Dressing: Total assistance Toilet Transfer: Total assistance Toileting- Clothing Manipulation and Hygiene: Total assistance Functional mobility during ADLs: Total assistance General ADL Comments: Pt limited by recent medication of fentanyl for headache/pain management and pt unable to fully arouse so totalA for all ADL and some of RUE for purposeful movement.  Cognition: Cognition Overall Cognitive Status: Impaired/Different from baseline Arousal/Alertness: Lethargic Orientation Level: Oriented to person,Oriented to time,Oriented to situation,Disoriented to place Attention: Sustained Sustained Attention: Impaired Sustained Attention Impairment: Functional basic Cognition Arousal/Alertness: Lethargic Behavior During Therapy: Restless,Impulsive (pt restless with R UE) Overall Cognitive Status: Impaired/Different from baseline Area of Impairment: Following commands,Awareness,Problem solving Following Commands: Follows one step commands with increased time (pt consistent with simple commands on R UE and R LE but unable to initiate transfers or use L UE or LE) Awareness:  (L inattention) Problem Solving: Slow processing,Decreased initiation,Difficulty sequencing,Requires verbal cues,Requires tactile cues General Comments: pt consistently following commands with R UE and LE. Pt able to state name, hospital and he had a stroke, however when asked to sit up straight or to tranfer to EOB pt unable to do  so  Blood pressure 133/63, pulse 74, temperature 98.4 F (36.9 C), temperature source Axillary, resp. rate 18, height 5\' 8"  (1.727 m), weight 84.4 kg, SpO2 98 %. Physical Exam Gen: no distress, normal appearing HEENT: oral mucosa pink and moist, NCAT Cardio: Reg rate Chest: normal effort, normal rate of breathing Abd: soft, non-distended Ext: no edema Psych: lethargic Skin: intact Neurological:     Comments: Patient lethargic difficult to arouse.  He would easily fall back asleep.  Nonverbal he did not follow commands.  Nasogastric tube in place as well as bilateral mittens.  Overall examination was limited    Results for orders placed or performed during the hospital encounter of 06/15/20 (from the past 24 hour(s))  Culture, Respiratory w Gram Stain     Status: None (Preliminary result)   Collection Time: 06/18/20  3:51 PM   Specimen: Tracheal Aspirate; Respiratory  Result Value Ref Range   Specimen Description TRACHEAL ASPIRATE    Special Requests NONE    Gram Stain      NO WBC SEEN MODERATE SQUAMOUS EPITHELIAL CELLS PRESENT ABUNDANT GRAM POSITIVE COCCI ABUNDANT GRAM NEGATIVE RODS MODERATE GRAM POSITIVE RODS    Culture      CULTURE REINCUBATED FOR BETTER GROWTH Performed at [facility], [address], [place], [place]    Report Status PENDING   Glucose, capillary     Status: Abnormal   Collection Time: 06/18/20  4:00 PM  Result Value Ref Range   Glucose-Capillary 152 (H) 70 - 99 mg/dL  Magnesium     Status: Abnormal   Collection Time: 06/18/20  4:16 PM  Result Value Ref Range   Magnesium 2.6 (H) 1.7 - 2.4 mg/dL  Glucose, capillary     Status: Abnormal   Collection  Time: 06/18/20  7:53 PM  Result Value Ref Range   Glucose-Capillary 139 (H) 70 - 99 mg/dL  Glucose, capillary     Status: Abnormal   Collection Time: 06/18/20 11:47 PM  Result Value Ref Range   Glucose-Capillary 145 (H) 70 - 99 mg/dL  Glucose, capillary     Status: Abnormal    Collection Time: 06/19/20  3:52 AM  Result Value Ref Range   Glucose-Capillary 148 (H) 70 - 99 mg/dL  CBC with Differential/Platelet     Status: Abnormal   Collection Time: 06/19/20  4:46 AM  Result Value Ref Range   WBC 12.2 (H) 4.0 - 10.5 K/uL   RBC 3.47 (L) 4.22 - 5.81 MIL/uL   Hemoglobin 11.5 (L) 13.0 - 17.0 g/dL   HCT 14.7 (L) 82.9 - 56.2 %   MCV 94.8 80.0 - 100.0 fL   MCH 33.1 26.0 - 34.0 pg   MCHC 35.0 30.0 - 36.0 g/dL   RDW 13.0 86.5 - 78.4 %   Platelets 175 150 - 400 K/uL   nRBC 0.0 0.0 - 0.2 %   Neutrophils Relative % 73 %   Neutro Abs 8.8 (H) 1.7 - 7.7 K/uL   Lymphocytes Relative 12 %   Lymphs Abs 1.5 0.7 - 4.0 K/uL   Monocytes Relative 14 %   Monocytes Absolute 1.7 (H) 0.1 - 1.0 K/uL   Eosinophils Relative 0 %   Eosinophils Absolute 0.0 0.0 - 0.5 K/uL   Basophils Relative 0 %   Basophils Absolute 0.1 0.0 - 0.1 K/uL   Immature Granulocytes 1 %   Abs Immature Granulocytes 0.09 (H) 0.00 - 0.07 K/uL  Basic metabolic panel     Status: Abnormal   Collection Time: 06/19/20  4:46 AM  Result Value Ref Range   Sodium 139 135 - 145 mmol/L   Potassium 3.9 3.5 - 5.1 mmol/L   Chloride 106 98 - 111 mmol/L   CO2 24 22 - 32 mmol/L   Glucose, Bld 136 (H) 70 - 99 mg/dL   BUN 35 (H) 8 - 23 mg/dL   Creatinine, Ser 6.96 0.61 - 1.24 mg/dL   Calcium 8.1 (L) 8.9 - 10.3 mg/dL   GFR, Estimated >29 >52 mL/min   Anion gap 9 5 - 15  Phosphorus     Status: Abnormal   Collection Time: 06/19/20  4:46 AM  Result Value Ref Range   Phosphorus 2.3 (L) 2.5 - 4.6 mg/dL  Magnesium     Status: Abnormal   Collection Time: 06/19/20  4:46 AM  Result Value Ref Range   Magnesium 2.5 (H) 1.7 - 2.4 mg/dL  Glucose, capillary     Status: Abnormal   Collection Time: 06/19/20  7:53 AM  Result Value Ref Range   Glucose-Capillary 132 (H) 70 - 99 mg/dL  Sodium     Status: None   Collection Time: 06/19/20 10:02 AM  Result Value Ref Range   Sodium 141 135 - 145 mmol/L   CT HEAD WO CONTRAST  Result  Date: 06/19/2020 CLINICAL DATA:  Follow-up right frontal parietal hemorrhage EXAM: CT HEAD WITHOUT CONTRAST TECHNIQUE: Contiguous axial images were obtained from the base of the skull through the vertex without intravenous contrast. COMPARISON:  06/16/2020 FINDINGS: Brain: There is again noted a right frontal parietal parenchymal hemorrhage seen. Mixed blood products are identified. The overall size is slightly larger than that seen on the prior exam now measuring 8.7 x 4.5 cm in greatest AP and transverse dimensions respectively. Surrounding edema  is noted stable from the previous exam. Minimal midline shift from right to left is noted also stable from the prior exam. Small area of parenchymal hemorrhage is noted measuring 6 mm within the left temporal lobe. No new focal hemorrhage is seen. No sizable intraventricular or subdural component is noted. Vascular: No hyperdense vessel or unexpected calcification. Skull: Normal. Negative for fracture or focal lesion. Sinuses/Orbits: Mucosal thickening is noted within the maxillary sinuses bilaterally. No other focal abnormality is seen. Other: None IMPRESSION: Slight enlargement in the parenchymal hemorrhage in the right frontal parietal region as described. Minimal midline shift from right to left is noted. Stable small parenchymal hemorrhage in the left temporal lobe unchanged from the prior exam. Electronically Signed   By: Alcide Clever M.D.   On: 06/19/2020 08:14   DG CHEST PORT 1 VIEW  Result Date: 06/18/2020 CLINICAL DATA:  Acute intracranial hemorrhage and concern for aspiration pneumonia. EXAM: PORTABLE CHEST 1 VIEW COMPARISON:  06/16/2020 FINDINGS: Stable heart size. Interval placement of feeding tube that extends below the diaphragm into the stomach. New bibasilar opacities may be consistent with atelectasis but could also be consistent with aspiration pneumonia given clinical history. No overt edema, pleural fluid or pneumothorax. IMPRESSION: 1. New  bibasilar opacities may be consistent with atelectasis but could also be consistent with aspiration pneumonia given clinical history. 2. Feeding tube extends below the diaphragm into the stomach. Electronically Signed   By: Irish Lack M.D.   On: 06/18/2020 12:57   [place] EKG SITE RITE  Result Date: 06/19/2020 If Site Rite image not attached, placement could not be confirmed due to current cardiac rhythm.    Assessment/Plan: Diagnosis: Multifocal intracerebral hemorrhage 1. Does the need for close, 24 hr/day medical supervision in concert with the patient's rehab needs make it unreasonable for this patient to be served in a less intensive setting? Yes 2. Co-Morbidities requiring supervision/potential complications:  1. Hypertensive emergency: will require TID monitoring of BP 2. Cerebral edema 3. HLD; start statin 4. Overweight (BMI 28.29) 5. Dysphagia: will require intensive SLP 3. Due to bladder management, bowel management, safety, skin/wound care, disease management, medication administration, pain management and patient education, does the patient require 24 hr/day rehab nursing? Yes 4. Does the patient require coordinated care of a physician, rehab nurse, therapy disciplines of PT, OT, SLP to address physical and functional deficits in the context of the above medical diagnosis(es)? Yes Addressing deficits in the following areas: balance, endurance, locomotion, strength, transferring, bowel/bladder control, bathing, dressing, feeding, grooming, toileting, cognition, speech, language, swallowing and psychosocial support 5. Can the patient actively participate in an intensive therapy program of at least 3 hrs of therapy per day at least 5 days per week? Potentially, will monitor as patient progresses 6. The potential for patient to make measurable gains while on inpatient rehab is fair 7. Anticipated functional outcomes upon discharge from inpatient rehab are mod assist  with PT, mod assist  with OT, mod assist with SLP. 8. Estimated rehab length of stay to reach the above functional goals is: 2-3 weeks 9. Anticipated discharge destination: Home 10. Overall Rehab/Functional Prognosis: fair  RECOMMENDATIONS: This patient's condition is appropriate for continued rehabilitative care in the following setting: CIR Patient has agreed to participate in recommended program. N/A Note that insurance prior authorization may be required for reimbursement for recommended care.  Comment: Thank you for this consult. Admission coordinator to follow.   I have personally performed a face to face diagnostic evaluation, including, but not limited  to relevant history and physical exam findings, of this patient and developed relevant assessment and plan.  Additionally, I have reviewed and concur with the physician assistant's documentation above.  Sula Soda, MD  Mcarthur Rossetti Angiulli, PA-C 06/19/2020

## 2020-06-19 NOTE — Progress Notes (Signed)
Inpatient Rehab Admissions Coordinator Note:   At this time we are recommending a CIR consult, and I will place an order per our protocol.  Please contact me with questions.   Estill Dooms, PT, DPT [phone] 06/19/20 12:37 PM

## 2020-06-19 NOTE — Progress Notes (Signed)
STROKE TEAM PROGRESS NOTE   INTERVAL HISTORY No acute events Family at bedside.  Patient more somnolent this morning.  Keeps eyes closed, right gaze preference, follows commands on the right (thumbs up and wiggles toes). He moves the right side freely antigravity, at least 4/5.  Left hemiplegia.   Blood pressure adequately controlled. CT head this a.m. with increase cerebral edema and midline shift.  Will start Hypertonic saline solution at 3575ml/hr.  PICC placement.  Check sodium level q 6 hours.  Sodium goal 150-155.  Follow exam closely.   Vitals:   06/19/20 0300 06/19/20 0400 06/19/20 0500 06/19/20 0600  BP: (!) 134/57 (!) 149/60 (!) 153/60 137/62  Pulse: 84 88 90 79  Resp: (!) 25 20 (!) 24 20  Temp:  99.1 F (37.3 C)    TempSrc:  Axillary    SpO2: 95% 95% 95% 97%  Weight:      Height:       CBC:  Recent Labs  Lab 06/18/20 0257 06/19/20 0446  WBC 21.2* 12.2*  NEUTROABS 16.6* 8.8*  HGB 13.0 11.5*  HCT 37.8* 32.9*  MCV 97.7 94.8  PLT 220 175   Basic Metabolic Panel:  Recent Labs  Lab 06/18/20 0257 06/18/20 1616 06/19/20 0446  NA 135  --  139  K 4.6  --  3.9  CL 104  --  106  CO2 18*  --  24  GLUCOSE 141*  --  136*  BUN 29*  --  35*  CREATININE 1.17  --  0.97  CALCIUM 8.5*  --  8.1*  MG  --  2.6* 2.5*  PHOS 3.3  --  2.3*   Lipid Panel:  Recent Labs  Lab 06/16/20 0021 06/17/20 0526 06/18/20 0257  CHOL 195  --   --   TRIG 312*   < > 190*  HDL 30*  --   --   CHOLHDL 6.5  --   --   VLDL 62*  --   --   LDLCALC 103*  --   --    < > = values in this interval not displayed.   HgbA1c:  Recent Labs  Lab 06/15/20 1941  HGBA1C 5.6   Urine Drug Screen:  Recent Labs  Lab 06/15/20 2037  LABOPIA NONE DETECTED  COCAINSCRNUR NONE DETECTED  LABBENZ NONE DETECTED  AMPHETMU NONE DETECTED  THCU NONE DETECTED  LABBARB NONE DETECTED    Alcohol Level No results for input(s): ETH in the last 168 hours.  IMAGING past 24 hours ECHOCARDIOGRAM  COMPLETE  Result Date: 06/17/2020 IMPRESSIONS   1. Left ventricular ejection fraction, by estimation, is 70 to 75%. The left ventricle has hyperdynamic function. The left ventricle has no regional wall motion abnormalities. Left ventricular diastolic parameters are consistent with Grade I diastolic dysfunction (impaired relaxation).   2. Right ventricular systolic function is normal. The right ventricular size is normal. Tricuspid regurgitation signal is inadequate for assessing PA pressure.   3. The mitral valve is normal in structure. Trivial mitral valve regurgitation. No evidence of mitral stenosis.   4. The aortic valve is tricuspid. Aortic valve regurgitation is not visualized. Mild aortic valve sclerosis is present, with no evidence of aortic valve stenosis.   5. The inferior vena cava is normal in size with greater than 50% respiratory variability, suggesting right atrial pressure of 3 mmHg.    IMAGING past 24 hours MR BRAIN W WO CONTRAST  Result Date: 06/15/2020 CLINICAL DATA:   IMPRESSION: 1. Innumerable foci of  hemosiderin deposition scattered throughout the brain consistent with previous hemorrhagic infarctions. Intraparenchymal hematoma at the right frontoparietal junction with internal clot retraction, measuring 7.8 x 4.7 x 4.6 cm. No abnormal enhancement to suggest that this represents a hemorrhagic mass. Small hemorrhage in the left temporal lobe cannot be specifically identified by MRI as different than the other foci of hemosiderin deposition. 2. Small amount of subarachnoid hemorrhage within the sulci. 3. Mild mass-effect upon the right lateral ventricle with right-to-left shift 3 mm. Chronic small-vessel ischemic changes of the white matter elsewhere. The overall pattern could either be due to amyloid angiopathy or hypertensive vascular disease. 4. No finding to suggest the presence metastatic disease. Electronically Signed   By: Paulina Fusi M.D.   On: 06/15/2020 22:56   DG  CHEST PORT 1 VIEW  Result Date: 06/15/2020 CLINICAL DATA:  Unresponsive. EXAM: PORTABLE CHEST 1 VIEW COMPARISON:  None. FINDINGS: The heart size and mediastinal contours are within normal limits. Both lungs are clear. The visualized skeletal structures are unremarkable. IMPRESSION: No active disease. Electronically Signed   By: Lupita Raider M.D.   On: 06/15/2020 20:29   CT HEAD CODE STROKE WO CONTRAST  Result Date: 06/15/2020 IMPRESSION:  1. Right frontoparietal parenchymal hemorrhage measuring 7.6 cm. Sub-5 mm left temporal hemorrhage.  2. No significant midline shift or ventricular entrapment.  3. Right greater than left cerebral convexity subarachnoid blood products.  4. ASPECTS is 10.   PHYSICAL EXAM Physical Exam Constitutional: Appears well-developed and well-nourished. Hard cervical collar in place.  Psych: Affect appropriate to situation,flat but cooperative Eyes: No scleral injection HENT: No oropharyngeal obstruction, c-collar in place.  MSK: no joint deformities.  Cardiovascular: Normal rateto slight tachycardiaand regular rhythm.  Respiratory:Rhonchorous breath sounds GI: Soft. No distension. There is no tenderness.  Skin: Warm dry and intact visible skin  Neuro: Mental Status: Patient isdrowsy does not open eyes to voice,  follows simple midline and one-step commands on the right Cranial Nerves: II: Visual Fields exam shows diminished blink to the left suggestive of a left hemianopia. Pupils are equal, round, and reactive to light.3 to 2 mm bilaterally III,IV, GX:QJJHE gaze preference, can track to midline V: Facial sensation isslightly reduced to eyelash brush on the left VII: Facial movement isnotable for a left facial droop.  VIII: hearing is intact to voice X: Uvuladifficult to visualize XI: unable to test due to cooperation XII: tongue is midline without atrophy or fasciculations.  Motor: Tone isincreased in the left upper extremity and  lower extremity. Bulk is normal.He moves the right side freely antigravity, at least 4/5. On the left upper weak WD, left lower triple flexion Sensory: Sensation appears reduced in the left arm and leg Deep Tendon Reflexes: 2+ and symmetric in the biceps and patellae. Plantars: Toes are upgoing on the left, down on the right   ASSESSMENT/PLAN  86 year old male with very minimal past medical history. Ate dinner with his wife at 1730 and having a conversationtill 1800 hrs. Abruptly at 1815 was found to be down. EMS on arrival found his blood pressure to be high systolic 200s with slurred speech and left sided weakness. He had an episode of emesis on route and was transiently hypoxemic.  Multifocal intracerebral hemorrhage, largest in the right MCA territory with associated right MCA syndrome, cerebral edema and brain compression .  Head CT with a large(greater than 30 cc, 7.6 x 4.7largest axial dimensions)intraparenchymal hemorrhage of the right insula with surrounding edema and mass-effect on the lateral ventricle without  intraventricular extension. There is also a small left temporal punctate hyperdensity concerning for hemorrhage and subarachnoid blood bilaterally, right greater than left   CTA head & neck  Stable appearance of 7.7 cm right frontoparietal hemorrhage. 2-3 mm leftward midline shift, unchanged. Sub 5 mm left temporal hemorrhage and bilateral subarachnoid blood products, unchanged.   CTA neck: No high-grade narrowing or large vessel occlusion within the neck. Less than 50% narrowing of the left CCA and bilateral proximal ICAs.   CTA head: No large vessel occlusion. High-grade distal right V4 segment narrowing. Mild to moderate right M2, right ICA terminus, distal basilar artery and bilateral PCA narrowing.   Cervical spine CT: No acute fracture or traumatic listhesis. Multilevel spondylosis most prominent at the C3-6 levels.   MRI   Innumerable foci of hemosiderin deposition scattered throughout the brain consistent with previous hemorrhagic infarctions.Intraparenchymal hematoma at the right frontoparietal junction with internal clot retraction, measuring 7.8 x 4.7 x 4.6 cm. No abnormal enhancement to suggest that this represents a hemorrhagic mass. Small hemorrhage in the left temporal lobe cannot be specifically identified by MRI as different than the other foci of hemosiderin deposition.Small amount of subarachnoid hemorrhage within the sulci. Mild mass-effect upon the right lateral ventricle with right-to-left shift 3 mm. Chronic small-vessel ischemic changes of the white matter elsewhere. The overall pattern could either be due to amyloid angiopathy or hypertensive vascular disease.   2D Echo: EF 70-75%, Grade I diastolic dysfunction  LDL 103  HgbA1c 5.6  VTE prophylaxis - Start Lovenox 40mg  daily  Recommended NPO per SLP.   Hold Anticoagulant/Antiplatelet medications in setting of IPH, SAH  No AC/AP prior to admission  CT head: Slight enlargement in the parenchymal hemorrhage in the right frontal parietal region as described. Minimal midline shift from  right to left  Therapy recommendations:  CIR  Disposition:  TBD  Cerebral Edema  Exam worsening with increase somnolence  Start 3% NaCl 18ml/hr  Sodium goal 150-155  Check sodium level q6hrs, current 140  Bolus with 23.4% now, PICC line is in place   Hypertension  Home meds: none, no hx of HTN but with BP 200s on EMS arrival  SBP goal <160   Currently on Norvasc 10mg  daily, coreg 25mg  bid  Wean off cleviprex infusion  Labetalol 20mg  q2hr prn, hydralazine 20mg  q6hr prn  Long-term BP goal normotensive  Hyperlipidemia  Home meds:  None   LDL 103, goal < 70  high intensity statin at discharge  Continue statin at discharge  Other Stroke Risk Factors  Advanced Age >/= 47   Overweight, Body mass index is 28.29 kg/m.,  recommend weight loss, diet and exercise as appropriate   Dysphagia  Secondary to stroke  SLP following  Cortrak in place  Tube feeds  Other Problems: Concern for C Spine injury  CCM cleared cervical collar   CT cervical spine: No acute fracture or traumatic listhesis.  No acute fracture or traumatic listhesis.  ? NSTEMI/At risk for MI Cardiac monitoring  Troponin 30  Aspiration Pneumonia  Febrile: Tmax 37.3  Leukocytosis: WBC 19.7 -> 21.2 ->12.2   Xray: bibasilar opacities  On Ceftriaxone 1g daily for possible aspiration pneumonia  Blood cultures (2/26): no growth to date  Sputum culture (2/28): pending   Hospital day # 4 I have personally obtained history,examined this patient, reviewed notes, independently viewed imaging studies, participated in medical decision making and plan of care.ROS completed by me personally and pertinent positives fully documented  I have made any  additions or clarifications directly to the above note. Agree with note above.  Recommend start hypertonic saline with serum sodium goal 150-155 and 75 cc an hour with close neurological monitoring.  We will need PICC line insertion for the same.  Continue ongoing therapies and rehab consult.  Discussed with patient and pharmacist and RN. This patient is critically ill and at significant risk of neurological worsening, death and care requires constant monitoring of vital signs, hemodynamics,respiratory and cardiac monitoring, extensive review of multiple databases, frequent neurological assessment, discussion with family, other specialists and medical decision making of high complexity.I have made any additions or clarifications directly to the above note.This critical care time does not reflect procedure time, or teaching time or supervisory time of PA/NP/Med Resident etc but could involve care discussion time.  I spent 30 minutes of neurocritical care time  in the care of  this patient.       Delia Heady, MD Medical Director [facility] Pager: [phone] 06/19/2020 2:56 PM   To contact Stroke Continuity provider, please refer to [email or web address]. After hours, contact General Neurology

## 2020-06-19 NOTE — Progress Notes (Signed)
Attempted to see pt today for CIR consult. Pt was very sedated due to getting Fentanyl per RN for getting PICC line 30-45 minutes prior to me seeing him- attempted to check back 1 hr later, still would not wake to stimulation.   Per RN, first day he's actually said any words that are understandable since came in.  Started on 3% saline due to bleed "a little worse".  When saw had mitten on RUE- asleep No movement seen on LUE/LLE even to trying tickle/get stimulation to pt.   Will see if f/u CIR physician can see pt tomorrow or Thursday.

## 2020-06-20 ENCOUNTER — Inpatient Hospital Stay (HOSPITAL_COMMUNITY): Payer: Medicare Other

## 2020-06-20 ENCOUNTER — Other Ambulatory Visit: Payer: Self-pay

## 2020-06-20 DIAGNOSIS — I611 Nontraumatic intracerebral hemorrhage in hemisphere, cortical: Secondary | ICD-10-CM | POA: Diagnosis not present

## 2020-06-20 LAB — BASIC METABOLIC PANEL
Anion gap: 7 (ref 5–15)
BUN: 25 mg/dL — ABNORMAL HIGH (ref 8–23)
CO2: 24 mmol/L (ref 22–32)
Calcium: 8.1 mg/dL — ABNORMAL LOW (ref 8.9–10.3)
Chloride: 121 mmol/L — ABNORMAL HIGH (ref 98–111)
Creatinine, Ser: 0.72 mg/dL (ref 0.61–1.24)
GFR, Estimated: >60 mL/min (ref >60)
Glucose, Bld: 155 mg/dL — ABNORMAL HIGH (ref 70–99)
Potassium: 4.3 mmol/L (ref 3.5–5.1)
Sodium: 152 mmol/L — ABNORMAL HIGH (ref 135–145)

## 2020-06-20 LAB — CBC WITH DIFFERENTIAL/PLATELET
Abs Immature Granulocytes: 0.09 10*3/uL — ABNORMAL HIGH (ref 0.00–0.07)
Basophils Absolute: 0 10*3/uL (ref 0.0–0.1)
Basophils Relative: 0 %
Eosinophils Absolute: 0.1 10*3/uL (ref 0.0–0.5)
Eosinophils Relative: 1 %
HCT: 35.3 % — ABNORMAL LOW (ref 39.0–52.0)
Hemoglobin: 11.7 g/dL — ABNORMAL LOW (ref 13.0–17.0)
Immature Granulocytes: 1 %
Lymphocytes Relative: 10 %
Lymphs Abs: 1.1 10*3/uL (ref 0.7–4.0)
MCH: 33 pg (ref 26.0–34.0)
MCHC: 33.1 g/dL (ref 30.0–36.0)
MCV: 99.4 fL (ref 80.0–100.0)
Monocytes Absolute: 1.5 10*3/uL — ABNORMAL HIGH (ref 0.1–1.0)
Monocytes Relative: 14 %
Neutro Abs: 8.1 10*3/uL — ABNORMAL HIGH (ref 1.7–7.7)
Neutrophils Relative %: 74 %
Platelets: 186 10*3/uL (ref 150–400)
RBC: 3.55 MIL/uL — ABNORMAL LOW (ref 4.22–5.81)
RDW: 13.1 % (ref 11.5–15.5)
WBC: 11 10*3/uL — ABNORMAL HIGH (ref 4.0–10.5)
nRBC: 0 % (ref 0.0–0.2)

## 2020-06-20 LAB — MAGNESIUM: Magnesium: 2.6 mg/dL — ABNORMAL HIGH (ref 1.7–2.4)

## 2020-06-20 LAB — PHOSPHORUS: Phosphorus: 2.2 mg/dL — ABNORMAL LOW (ref 2.5–4.6)

## 2020-06-20 LAB — GLUCOSE, CAPILLARY
Glucose-Capillary: 124 mg/dL — ABNORMAL HIGH (ref 70–99)
Glucose-Capillary: 141 mg/dL — ABNORMAL HIGH (ref 70–99)
Glucose-Capillary: 143 mg/dL — ABNORMAL HIGH (ref 70–99)
Glucose-Capillary: 144 mg/dL — ABNORMAL HIGH (ref 70–99)
Glucose-Capillary: 145 mg/dL — ABNORMAL HIGH (ref 70–99)
Glucose-Capillary: 154 mg/dL — ABNORMAL HIGH (ref 70–99)

## 2020-06-20 LAB — SODIUM
Sodium: 150 mmol/L — ABNORMAL HIGH (ref 135–145)
Sodium: 175 mmol/L (ref 135–145)
Sodium: 155 mmol/L — ABNORMAL HIGH (ref 135–145)
Sodium: 155 mmol/L — ABNORMAL HIGH (ref 135–145)

## 2020-06-20 MED ORDER — ORAL CARE MOUTH RINSE
15.0000 mL | Freq: Two times a day (BID) | OROMUCOSAL | Status: DC
  Administered 2020-06-20 – 2020-06-21 (×3): 15 mL via OROMUCOSAL

## 2020-06-20 MED ORDER — SODIUM PHOSPHATES 45 MMOLE/15ML IV SOLN
20.0000 mmol | Freq: Once | INTRAVENOUS | Status: AC
  Administered 2020-06-20: 20 mmol via INTRAVENOUS
  Filled 2020-06-20: qty 6.67

## 2020-06-20 NOTE — Progress Notes (Signed)
NAME:  Calvin Yates, MRN:  [id number], DOB:  [birth year], LOS: 5 ADMISSION DATE:  06/15/2020, CONSULTATION DATE:  06/15/20 REFERRING MD:  Dr Iver Nestle - neuro, CHIEF COMPLAINT:   - Acute encephalopathy due to ICH  Brief History:  Acute encephalopathy due to ICH  History of Present Illness: Obtained from [facility] neuro hospitalist.  Patient unable to give history  86 year old male with very minimal past medical history.  Ate dinner with his wife at 1730 and having a conversation till 1800 hrs.  Abruptly at 1815 was found to be down.  EMS on arrival found his blood pressure to be high systolic 200s.  He had an episode of emesis on route and transiently hypoxemic.  But in the ER for the 90 minutes prior to CCM evaluation saturation 95% on room air and no further emesis.  He is drowsy but following commands with hemiparesis on the left side.  7 cm right intracranial hemorrhage frontoparietal region hemorrhage without midline shift.  Patient's not intubated.  He has a hard collar on because of his fall.  C-spine CT is pending.  CCM called because of high risk of worsening encephalopathy and respiratory failure  Significant Hospital Events:  06/15/2020 - admit  Consults:  06/15/2020  - ccm   Procedures:    Significant Diagnostic Tests:   CT HEAD 2/25 > Right frontoparietal parenchymal hemorrhage measuring 7.6 cm. Sub-5 mm left temporal hemorrhage. No significant midline shift or ventricular entrapment. Right greater than left cerebral convexity subarachnoid blood products.  CXR 06/18/2020       Interval placement of feeding tube that extends       below the diaphragm into the stomach. New bibasilar opacities may be       consistent with atelectasis but could also be consistent with       aspiration pneumonia given clinical history. No overt edema, pleural       fluid or pneumothorax.       CT Head wo contrast 06/19/2020         Slight enlargement in the parenchymal hemorrhage in the right          frontal parietal region as described. Minimal midline shift from         right to left is noted.          Stable small parenchymal hemorrhage in the left temporal lobe         unchanged from the prior exam.   Micro Data:  06/18/2020 Respiratory Culture>> GS: ABUNDANT GRAM POSITIVE COCCI , ABUNDANT GRAM NEGATIVE RODS , MODERATE GRAM POSITIVE RODS Culture Pending>>  Antimicrobials:  Rocephin 06/18/2020>>  Interim History / Subjective:  Somnolent, though arousable mildly tachypneic Remains on 4 L Started on Rocephin yesterday, no fever today Continued good urine output, +2.5 L since admission  Head CT 3/1 with slight enlargement of parenchymal hemorrhage right frontal parietal region with minimal midline shift, therefore 3% saline initiated   Objective   Blood pressure (!) 159/59, pulse 88, temperature 98.5 F (36.9 C), temperature source Axillary, resp. rate 20, height 5\' 8"  (1.727 m), weight 84 kg, SpO2 96 %.        Intake/Output Summary (Last 24 hours) at 06/20/2020 0740 Last data filed at 06/20/2020 0700 Gross per 24 hour  Intake 2323.27 ml  Output 2225 ml  Net 98.27 ml   Filed Weights   06/16/20 0000 06/16/20 0600 06/20/20 0500  Weight: 84.4 kg 84.4 kg 84 kg     General: Elderly  male, weak and fatigued appearing HEENT: MM pink/moist, on nasal cannula.  Pupils 2 mm and reactive to light bilaterally Neuro: Sleeping, will open his eyes and move extremities, though not moving L UE CV: s1s2 rrr, no m/r/g PULM: Tachypneic with mild accessory muscle use, on 4 L nasal cannula with adequate oxygen saturations.  Lungs are relatively clear throughout without wheezing or rhonchi GI: soft, bsx4 active  Extremities: warm/dry, no edema  Skin: no rashes or lesions      Resolved Hospital Problem list   Concern for C-spine injury  Assessment & Plan:   Large intracranial hemorrhage and associated acute encephalopathy and subsequent High risk for acute respiratory failure  requiring intubation, concern for aspiration Remains largely stable today though not much improved respiratory and mental status standpoint P: -Continue supportive care and close neurologic monitoring -3% saline with sodium goal 150-155 bolused yesterday, has PICC line -Follow every 6 hours sodium -SBP goal less than 160, off Cleviprex, continue Coreg and Norvasc -On statin   Possible aspiration pneumonia Per report, vomited in route to the hospital Ceftriaxone initiated, sputum culture pending WBC is downtrending today P: -Continue closely monitoring respiratory status, pulmonary hygiene, HOB 30 degrees -Follow culture results, gram positives and gram negatives on Gram stain, continue Rocephin -Has cor track in place for tube feeds, monitor glucose    Hypertensive emergency associated with intracranial hemorrhage Slight enlargement in the parenchymal hemorrhage with minimal R>L shify noted 3/1 repeat CT head -B/P per EMS was systolicly in the 200s P: -Cleviprex off -Continue p.o. antihypertensives along with prn's -Echo with EF 70 to 75% and grade 1 diastolic dysfunction   Hypophosphatemia -Place and continue to monitor   Best practice (evaluated daily)  Diet: Tube feeds Pain/Anxiety/Delirium protocol (if indicated): Currently none VAP protocol (if indicated):n/a DVT prophylaxis: Lovenox GI prophylaxis: PPI Glucose control: SSI Mobility: Bedrest Disposition: Neuro ICU for evolving stroke, hypertensive crisis  Goals of Care:   Family Updates: Per primary   LABS    PULMONARY Recent Labs  Lab 06/15/20 1937  TCO2 23    CBC Recent Labs  Lab 06/18/20 0257 06/19/20 0446 06/20/20 0433  HGB 13.0 11.5* 11.7*  HCT 37.8* 32.9* 35.3*  WBC 21.2* 12.2* 11.0*  PLT 220 175 186    COAGULATION Recent Labs  Lab 06/15/20 1934  INR 1.0    CARDIAC  No results for input(s): TROPONINI in the last 168 hours. No results for input(s): PROBNP in the last 168  hours.   CHEMISTRY Recent Labs  Lab 06/16/20 0021 06/17/20 0526 06/18/20 0257 06/18/20 1616 06/19/20 0446 06/19/20 1002 06/19/20 1543 06/19/20 2152 06/20/20 0433  NA 134* 135 135  --  139 141 146* 150* 152*  K 4.0 4.0 4.6  --  3.9  --   --   --  4.3  CL 103 102 104  --  106  --   --   --  121*  CO2 22 22 18*  --  24  --   --   --  24  GLUCOSE 164* 145* 141*  --  136*  --   --   --  155*  BUN 20 24* 29*  --  35*  --   --   --  25*  CREATININE 1.08 1.23 1.17  --  0.97  --   --   --  0.72  CALCIUM 8.7* 8.5* 8.5*  --  8.1*  --   --   --  8.1*  MG 2.3  --   --  2.6* 2.5*  --   --   --  2.6*  PHOS 2.8 4.1 3.3  --  2.3*  --   --   --  2.2*   Estimated Creatinine Clearance: 72.5 mL/min (by C-G formula based on SCr of 0.72 mg/dL).   LIVER Recent Labs  Lab 06/15/20 1934 06/16/20 0021  AST 24 30  ALT 23 23  ALKPHOS 44 76  BILITOT 0.8 0.4  PROT 7.0 7.0  ALBUMIN 4.1 4.0  INR 1.0  --      INFECTIOUS Recent Labs  Lab 06/16/20 0021 06/17/20 0526 06/18/20 0257  LATICACIDVEN 1.9  --   --   PROCALCITON <0.10 <0.10 0.13     ENDOCRINE CBG (last 3)  Recent Labs    06/19/20 1958 06/19/20 2345 06/20/20 0405  GLUCAP 133* 147* 124*     Critical Care: 35 minutes      CRITICAL CARE Performed by: Darcella Gasman Jiyaan Steinhauser   Total critical care time: 35 minutes  Critical care time was exclusive of separately billable procedures and treating other patients.  Critical care was necessary to treat or prevent imminent or life-threatening deterioration.  Critical care was time spent personally by me on the following activities: development of treatment plan with patient and/or surrogate as well as nursing, discussions with consultants, evaluation of patient's response to treatment, examination of patient, obtaining history from patient or surrogate, ordering and performing treatments and interventions, ordering and review of laboratory studies, ordering and review of radiographic  studies, pulse oximetry and re-evaluation of patient's condition.   Darcella Gasman Abuk Selleck, PA-C [facility] See Amion for pager If no response to pager , please call [phone] until 7pm After 7:00 pm call Elink  [phone]

## 2020-06-20 NOTE — Progress Notes (Signed)
STROKE TEAM PROGRESS NOTE   INTERVAL HISTORY No acute events No family at bedside. Patient remains omnolent this morning.  Neurological exam mostly unchanged.  Keeps eyes closed, right gaze preference, follows commands on the right (thumbs up and wiggles toes). He moves the right side freely antigravity, at least 4/5.  Left hemiplegia.   Blood pressure adequately controlled. He remains on hypertonic saline and serum sodium is optimal at 152 this morning.Marland Kitchen Speech therapy recommended n.p.o. status and he has a feeding tube  Vitals:   06/20/20 1020 06/20/20 1100 06/20/20 1200 06/20/20 1300  BP: (!) 158/64 (!) 150/62 (!) 155/63 (!) 157/57  Pulse:  74 85 83  Resp:  (!) 22 (!) 23 (!) 22  Temp:   98.8 F (37.1 C)   TempSrc:   Axillary   SpO2:  97% 96% 96%  Weight:      Height:       CBC:  Recent Labs  Lab 06/19/20 0446 06/20/20 0433  WBC 12.2* 11.0*  NEUTROABS 8.8* 8.1*  HGB 11.5* 11.7*  HCT 32.9* 35.3*  MCV 94.8 99.4  PLT 175 186   Basic Metabolic Panel:  Recent Labs  Lab 06/19/20 0446 06/19/20 1002 06/20/20 0433 06/20/20 1035 06/20/20 1249  NA 139   < > 152* 175* 155*  K 3.9  --  4.3  --   --   CL 106  --  121*  --   --   CO2 24  --  24  --   --   GLUCOSE 136*  --  155*  --   --   BUN 35*  --  25*  --   --   CREATININE 0.97  --  0.72  --   --   CALCIUM 8.1*  --  8.1*  --   --   MG 2.5*  --  2.6*  --   --   PHOS 2.3*  --  2.2*  --   --    < > = values in this interval not displayed.   Lipid Panel:  Recent Labs  Lab 06/16/20 0021 06/17/20 0526 06/18/20 0257  CHOL 195  --   --   TRIG 312*   < > 190*  HDL 30*  --   --   CHOLHDL 6.5  --   --   VLDL 62*  --   --   LDLCALC 103*  --   --    < > = values in this interval not displayed.   HgbA1c:  Recent Labs  Lab 06/15/20 1941  HGBA1C 5.6   Urine Drug Screen:  Recent Labs  Lab 06/15/20 2037  LABOPIA NONE DETECTED  COCAINSCRNUR NONE DETECTED  LABBENZ NONE DETECTED  AMPHETMU NONE DETECTED  THCU NONE  DETECTED  LABBARB NONE DETECTED    Alcohol Level No results for input(s): ETH in the last 168 hours.  IMAGING past 24 hours ECHOCARDIOGRAM COMPLETE  Result Date: 06/17/2020 IMPRESSIONS   1. Left ventricular ejection fraction, by estimation, is 70 to 75%. The left ventricle has hyperdynamic function. The left ventricle has no regional wall motion abnormalities. Left ventricular diastolic parameters are consistent with Grade I diastolic dysfunction (impaired relaxation).   2. Right ventricular systolic function is normal. The right ventricular size is normal. Tricuspid regurgitation signal is inadequate for assessing PA pressure.   3. The mitral valve is normal in structure. Trivial mitral valve regurgitation. No evidence of mitral stenosis.   4. The aortic valve is tricuspid. Aortic valve  regurgitation is not visualized. Mild aortic valve sclerosis is present, with no evidence of aortic valve stenosis.   5. The inferior vena cava is normal in size with greater than 50% respiratory variability, suggesting right atrial pressure of 3 mmHg.    IMAGING past 24 hours MR BRAIN W WO CONTRAST  Result Date: 06/15/2020 CLINICAL DATA:   IMPRESSION: 1. Innumerable foci of hemosiderin deposition scattered throughout the brain consistent with previous hemorrhagic infarctions. Intraparenchymal hematoma at the right frontoparietal junction with internal clot retraction, measuring 7.8 x 4.7 x 4.6 cm. No abnormal enhancement to suggest that this represents a hemorrhagic mass. Small hemorrhage in the left temporal lobe cannot be specifically identified by MRI as different than the other foci of hemosiderin deposition. 2. Small amount of subarachnoid hemorrhage within the sulci. 3. Mild mass-effect upon the right lateral ventricle with right-to-left shift 3 mm. Chronic small-vessel ischemic changes of the white matter elsewhere. The overall pattern could either be due to amyloid angiopathy or hypertensive vascular  disease. 4. No finding to suggest the presence metastatic disease. Electronically Signed   By: Paulina Fusi M.D.   On: 06/15/2020 22:56   DG CHEST PORT 1 VIEW  Result Date: 06/15/2020 CLINICAL DATA:  Unresponsive. EXAM: PORTABLE CHEST 1 VIEW COMPARISON:  None. FINDINGS: The heart size and mediastinal contours are within normal limits. Both lungs are clear. The visualized skeletal structures are unremarkable. IMPRESSION: No active disease. Electronically Signed   By: Lupita Raider M.D.   On: 06/15/2020 20:29   CT HEAD CODE STROKE WO CONTRAST  Result Date: 06/15/2020 IMPRESSION:  1. Right frontoparietal parenchymal hemorrhage measuring 7.6 cm. Sub-5 mm left temporal hemorrhage.  2. No significant midline shift or ventricular entrapment.  3. Right greater than left cerebral convexity subarachnoid blood products.  4. ASPECTS is 10.   PHYSICAL EXAM Physical Exam Constitutional: Appears well-developed and well-nourished.  Psych: Affect appropriate to situation,flat but cooperative Eyes: No scleral injection HENT: No oropharyngeal obstruction, c-collar in place.  MSK: no joint deformities.  Cardiovascular: Normal rateto slight tachycardiaand regular rhythm.  Respiratory:Rhonchorous breath sounds GI: Soft. No distension. There is no tenderness.  Skin: Warm dry and intact visible skin  Neuro: Mental Status: Patient isdrowsy does not open eyes to voice,  follows simple midline and one-step commands on the right Cranial Nerves: II: Visual Fields exam shows diminished blink to the left suggestive of a left hemianopia. Pupils are equal, round, and reactive to light.3 to 2 mm bilaterally III,IV, HQ:IONGE gaze preference, can track to midline V: Facial sensation isslightly reduced to eyelash brush on the left VII: Facial movement isnotable for a left facial droop.  VIII: hearing is intact to voice X: Uvuladifficult to visualize XI: unable to test due to cooperation XII:  tongue is midline without atrophy or fasciculations.  Motor: Tone isincreased in the left upper extremity and lower extremity. Bulk is normal.He moves the right side freely antigravity, at least 4/5. On the left upper weak WD, left lower triple flexion Sensory: Sensation appears reduced in the left arm and leg Deep Tendon Reflexes: 2+ and symmetric in the biceps and patellae. Plantars: Toes are upgoing on the left, down on the right   ASSESSMENT/PLAN  86 year old male with very minimal past medical history. Ate dinner with his wife at 1730 and having a conversationtill 1800 hrs. Abruptly at 1815 was found to be down. EMS on arrival found his blood pressure to be high systolic 200s with slurred speech and left sided weakness. He  had an episode of emesis on route and was transiently hypoxemic.  Multifocal intracerebral hemorrhage, largest in the right MCA territory with associated right MCA syndrome, cerebral edema and brain compression .  Head CT with a large(greater than 30 cc, 7.6 x 4.7largest axial dimensions)intraparenchymal hemorrhage of the right insula with surrounding edema and mass-effect on the lateral ventricle without intraventricular extension. There is also a small left temporal punctate hyperdensity concerning for hemorrhage and subarachnoid blood bilaterally, right greater than left   CTA head & neck  Stable appearance of 7.7 cm right frontoparietal hemorrhage. 2-3 mm leftward midline shift, unchanged. Sub 5 mm left temporal hemorrhage and bilateral subarachnoid blood products, unchanged.   CTA neck: No high-grade narrowing or large vessel occlusion within the neck. Less than 50% narrowing of the left CCA and bilateral proximal ICAs.   CTA head: No large vessel occlusion. High-grade distal right V4 segment narrowing. Mild to moderate right M2, right ICA terminus, distal basilar artery and bilateral PCA narrowing.   Cervical spine CT: No acute  fracture or traumatic listhesis. Multilevel spondylosis most prominent at the C3-6 levels.   MRI  Innumerable foci of hemosiderin deposition scattered throughout the brain consistent with previous hemorrhagic infarctions.Intraparenchymal hematoma at the right frontoparietal junction with internal clot retraction, measuring 7.8 x 4.7 x 4.6 cm. No abnormal enhancement to suggest that this represents a hemorrhagic mass. Small hemorrhage in the left temporal lobe cannot be specifically identified by MRI as different than the other foci of hemosiderin deposition.Small amount of subarachnoid hemorrhage within the sulci. Mild mass-effect upon the right lateral ventricle with right-to-left shift 3 mm. Chronic small-vessel ischemic changes of the white matter elsewhere. The overall pattern could either be due to amyloid angiopathy or hypertensive vascular disease.   2D Echo: EF 70-75%, Grade I diastolic dysfunction  LDL 103  HgbA1c 5.6  VTE prophylaxis - Start Lovenox 40mg  daily  Recommended NPO per SLP.   Hold Anticoagulant/Antiplatelet medications in setting of IPH, SAH  No AC/AP prior to admission  CT head: Slight enlargement in the parenchymal hemorrhage in the right frontal parietal region as described. Minimal midline shift from  right to left  Therapy recommendations:  CIR  Disposition:  TBD  Cerebral Edema  Exam worsening with increase somnolence  Start 3% NaCl 21ml/hr  Sodium goal 150-155  Check sodium level q6hrs, current 140  Bolus with 23.4% now, PICC line is in place   Hypertension  Home meds: none, no hx of HTN but with BP 200s on EMS arrival  SBP goal <160   Currently on Norvasc 10mg  daily, coreg 25mg  bid  Wean off cleviprex infusion  Labetalol 20mg  q2hr prn, hydralazine 20mg  q6hr prn  Long-term BP goal normotensive  Hyperlipidemia  Home meds:  None   LDL 103, goal < 70  high intensity statin at discharge  Continue statin at  discharge  Other Stroke Risk Factors  Advanced Age >/= 67   Overweight, Body mass index is 28.29 kg/m., recommend weight loss, diet and exercise as appropriate   Dysphagia  Secondary to stroke  SLP following  Cortrak in place  Tube feeds  Other Problems: Concern for C Spine injury  CCM cleared cervical collar   CT cervical spine: No acute fracture or traumatic listhesis.  No acute fracture or traumatic listhesis.  ? NSTEMI/At risk for MI Cardiac monitoring  Troponin 30  Aspiration Pneumonia  Febrile: Tmax 37.3  Leukocytosis: WBC 19.7 -> 21.2 ->12.2   Xray: bibasilar opacities  On Ceftriaxone  1g daily for possible aspiration pneumonia  Blood cultures (2/26): no growth to date Sputum culture (2/28): Negative  Hospital day # 5 Patient neurological exam remains unchanged.  Continue.  hypertonic saline with serum sodium goal 150-155 at 75 cc an hour with close neurological monitoring.   Plan repeat CT scan of the head tomorrow morning and if edema is decreasing we can consider weaning hypertonic saline drip..  Continue ongoing therapies and rehab consult.  Discussed with patient and bedside RN. This patient is critically ill and at significant risk of neurological worsening, death and care requires constant monitoring of vital signs, hemodynamics,respiratory and cardiac monitoring, extensive review of multiple databases, frequent neurological assessment, discussion with family, other specialists and medical decision making of high complexity.I have made any additions or clarifications directly to the above note.This critical care time does not reflect procedure time, or teaching time or supervisory time of PA/NP/Med Resident etc but could involve care discussion time.  I spent 30 minutes of neurocritical care time  in the care of  this patient.      Delia HeadyPramod Thatcher Doberstein, MD Medical Director [facility] Pager: [phone] 06/20/2020 1:45 PM   To contact  Stroke Continuity provider, please refer to [email or web address]. After hours, contact General Neurology

## 2020-06-20 NOTE — Progress Notes (Signed)
  Speech Language Pathology Treatment: Dysphagia  Patient Details Name: Calvin Yates MRN: [id number] DOB: 26-Jan-1935 Today's Date: 06/20/2020 Time: 1300-1320 SLP Time Calculation (min) (ACUTE ONLY): 20 min  Assessment / Plan / Recommendation Clinical Impression  SLP provided education to RN and MD regarding significant dried bloody secretions at the posterior oral cavity. RN and SLP worked together to provide aggressive oral care, with successful removal of some of the secretions. Pt does not open his mouth wide, and tends to bite down during oral care. SLP and RN used tongue blade and yankauer during oral swabbing to encourage moistening and loosening of attached mucus. Pt requires encouragement to cough/hock up secretions, and his efforts are weak and not always effective. Slight improvement noted in voice quality and intelligibility of speech following oral care. Encouraged increased frequency of oral care with suction. SLP will continue to follow.    HPI HPI: Calvin Yates is a 86 y.o. male with no significant history who presents to the ED via EMS for Code Stroke.  EMS noted patient to have slurred speech and L-sided weakness. Brain MRI reported right frontoparietal parenchymal hemorrhage and left temporal hemorrhage.      SLP Plan  Continue with current plan of care       Recommendations  Diet recommendations: NPO Medication Administration: Via alternative means                General recommendations: Rehab consult Oral Care Recommendations: Oral care QID;Staff/trained caregiver to provide oral care Follow up Recommendations: Inpatient Rehab SLP Visit Diagnosis: Dysphagia, unspecified (R13.10) Plan: Continue with current plan of care       GO               Ely Ballen B. Murvin Natal, [facility], CCC-SLP Speech Language Pathologist Office: [phone] Pager: [phone]  Leigh Aurora 06/20/2020, 1:21 PM

## 2020-06-20 NOTE — Progress Notes (Addendum)
  Speech Language Pathology Treatment: Dysphagia;Cognitive-Linguistic  Patient Details Name: Calvin Yates MRN: [id number] DOB: 12/15/1934 Today's Date: 06/20/2020 Time: 2956-2130 SLP Time Calculation (min) (ACUTE ONLY): 35 min  Assessment / Plan / Recommendation Clinical Impression  Pt seen at bedside for skilled ST intervention targeting goals for PO readiness and diagnostic treatment for cognitive-linguistic deficits. Pt was awake, but lethargic, leaning to the left. Left facial weakness was apparent. Oral care was completed with suction. Visualization of the oral cavity revealed significantly dry mucosa. "Thick" voice quality was also noted, however, pt was unable to maintain a wide enough opening to allow more than a brief glance at the back of the throat. Pt appears to have a significant amount of dried bloody secretions along the faucial arches and likely the posterior pharyngeal wall, which are adversely affecting his voice quality. Aggressive oral care was completed, with attempts to suction the back of his mouth and remove secretions. This was unsuccessful, unfortunately. Pt was given one small ice chip, and required tactile assist to keep it in his mouth. No oral manipulation was observed, and the ice chip eventually fell out of his mouth. No further PO trials were attempted.   Pt speech continues to be markedly dysarthric, exacerbated by the dried secretions in back of the oral cavity. Pt is unable to demonstrate the ability to decrease rate of speech or overarticulate to maximize intelligibility. Administration of the Mini-mental state examination (MMSE) was attempted. Pt was accurate for orientation to month and year, name and DOB, city and hospital. He was able to repeat 3 unrelated words, but not recall them several minutes later. Lethargy prevented continued administration of this assessment at this time. Pt verbalized awareness that he had had "a stroke - a bad stroke".   Pt is  currently receiving nutrition via Cortrak. Recommend regular aggressive oral care, and/or consideration of suctioning with respiratory therapy to facilitate clearing of dried secretions. This will improve many aspects of pt speech and swallowing safety. MD informed, Discussed with RN. SLP will continue to follow acutely to assess readiness for PO intake and cognitive-linguistic therapy.    HPI HPI: Rondrick Barreira is a 86 y.o. male with no significant history who presents to the ED via EMS for Code Stroke.  EMS noted patient to have slurred speech and L-sided weakness. Brain MRI reported right frontoparietal parenchymal hemorrhage and left temporal hemorrhage.      SLP Plan  Continue with current plan of care       Recommendations  Diet recommendations: NPO Medication Administration: Via alternative means                General recommendations: Rehab consult Oral Care Recommendations: Oral care QID Follow up Recommendations: Inpatient Rehab SLP Visit Diagnosis: Dysphagia, unspecified (R13.10);Dysarthria and anarthria (R47.1);Cognitive communication deficit (R41.841) Plan: Continue with current plan of care       GO               Cu[place] Attar B. Murvin Natal, [facility], CCC-SLP Speech Language Pathologist Office: [phone] Pager: [phone]  Leigh Aurora 06/20/2020, 12:38 PM

## 2020-06-20 NOTE — Progress Notes (Signed)
Physical Therapy Treatment Patient Details Name: Calvin Yates MRN: [id number] DOB: 1934-08-22 Today's Date: 06/20/2020    History of Present Illness The pt is an 86 yo male presenting with elevated BP and tachycardia after being found down by his wife. 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    PT Comments    Pt with improved interaction with PT today. Once seated at EOB pt was able to comb hair with R UE and modA posterior support, pt washed face with minA to help clean around eyes. Pt then began opening his eyes more and focusing on therapist. Pt more verbal today and demo'd some initiation of L LAQ as noted by visible contraction of L thigh during attempt. PT to attempt OOB next session, will most likely need maxisky for safe transfer to chair. Acute PT to cont to follow.    Follow Up Recommendations  CIR     Equipment Recommendations       Recommendations for Other Services Rehab consult     Precautions / Restrictions Precautions Precautions: Fall Precaution Comments: pt c-spine cleared, no need for c-collar Restrictions Weight Bearing Restrictions: No    Mobility  Bed Mobility Overal bed mobility: Needs Assistance Bed Mobility: Rolling;Sidelying to Sit;Sit to Supine Rolling: Max assist;+2 for physical assistance Sidelying to sit: Max assist;+2 for physical assistance   Sit to supine: Max assist;+2 for physical assistance   General bed mobility comments: pt attempted to assist with R UE and initiated bilat LE movement to EOB, ultimately required maxAx2 for trunk elevation and to bring hips to EOB    Transfers                 General transfer comment: will need maxi sky to complete  Ambulation/Gait             General Gait Details: unable   Stairs             Wheelchair Mobility    Modified Rankin (Stroke Patients Only) Modified Rankin (Stroke Patients Only) Pre-Morbid Rankin Score: No  symptoms Modified Rankin: Severe disability     Balance Overall balance assessment: Needs assistance Sitting-balance support: Feet supported;Single extremity supported Sitting balance-Leahy Scale: Zero Sitting balance - Comments: Pt with less pushing and L lateral lean today however remains unable to use L UE functionally to support self Postural control: Posterior lean;Left lateral lean                                  Cognition Arousal/Alertness: Awake/alert (despite eyes being kept closed) Behavior During Therapy: WFL for tasks assessed/performed Overall Cognitive Status: Impaired/Different from baseline Area of Impairment: Following commands;Awareness;Problem solving                       Following Commands: Follows one step commands consistently;Follows multi-step commands inconsistently   Awareness: Intellectual Problem Solving: Slow processing;Decreased initiation;Difficulty sequencing;Requires verbal cues;Requires tactile cues General Comments: pt more vocal today despite dysarthria, pt with more initiation of transfers today      Exercises General Exercises - Upper Extremity Shoulder Flexion: AAROM;Right;10 reps;Seated General Exercises - Lower Extremity Ankle Circles/Pumps: AROM;Right;10 reps;Seated Long Arc Quad: AROM;Right;10 reps;Seated    General Comments General comments (skin integrity, edema, etc.): VSS      Pertinent Vitals/Pain Pain Assessment: Faces Faces Pain Scale: Hurts a little bit Pain Location: grimacing with oral care Pain  Descriptors / Indicators: Discomfort;Grimacing Pain Intervention(s): Limited activity within patient's tolerance;Monitored during session;Repositioned    Home Living Family/patient expects to be discharged to:: Private residence Living Arrangements: Spouse/significant other                  Prior Function            PT Goals (current goals can now be found in the care plan section)  Progress towards PT goals: Progressing toward goals    Frequency    Min 4X/week      PT Plan Current plan remains appropriate    Co-evaluation              AM-PAC PT "6 Clicks" Mobility   Outcome Measure  Help needed turning from your back to your side while in a flat bed without using bedrails?: Total Help needed moving from lying on your back to sitting on the side of a flat bed without using bedrails?: Total Help needed moving to and from a bed to a chair (including a wheelchair)?: Total Help needed standing up from a chair using your arms (e.g., wheelchair or bedside chair)?: Total Help needed to walk in hospital room?: Total Help needed climbing 3-5 steps with a railing? : Total 6 Click Score: 6    End of Session Equipment Utilized During Treatment: Oxygen Activity Tolerance: Patient tolerated treatment well Patient left: in bed;with call bell/phone within reach;with bed alarm set;with family/visitor present Nurse Communication: Mobility status (encouraged RN staff to use maxisky to transfer pt OOB to chair) PT Visit Diagnosis: Hemiplegia and hemiparesis Hemiplegia - Right/Left: Left Hemiplegia - dominant/non-dominant: Non-dominant Hemiplegia - caused by: Nontraumatic intracerebral hemorrhage     Time: 0910-0935 PT Time Calculation (min) (ACUTE ONLY): 25 min  Charges:  $Therapeutic Activity: 8-22 mins $Neuromuscular Re-education: 8-22 mins                     Calvin Yates, PT, DPT Acute Rehabilitation Services Pager #: [phone] Office #: [phone]    Calvin Yates 06/20/2020, 12:29 PM

## 2020-06-21 ENCOUNTER — Encounter (HOSPITAL_COMMUNITY): Payer: Self-pay | Admitting: Neurology

## 2020-06-21 ENCOUNTER — Inpatient Hospital Stay (HOSPITAL_COMMUNITY): Payer: Medicare Other

## 2020-06-21 DIAGNOSIS — R069 Unspecified abnormalities of breathing: Secondary | ICD-10-CM | POA: Diagnosis not present

## 2020-06-21 DIAGNOSIS — Z789 Other specified health status: Secondary | ICD-10-CM | POA: Diagnosis not present

## 2020-06-21 DIAGNOSIS — E870 Hyperosmolality and hypernatremia: Secondary | ICD-10-CM | POA: Diagnosis not present

## 2020-06-21 DIAGNOSIS — I619 Nontraumatic intracerebral hemorrhage, unspecified: Secondary | ICD-10-CM | POA: Diagnosis not present

## 2020-06-21 DIAGNOSIS — T17908A Unspecified foreign body in respiratory tract, part unspecified causing other injury, initial encounter: Secondary | ICD-10-CM

## 2020-06-21 DIAGNOSIS — I610 Nontraumatic intracerebral hemorrhage in hemisphere, subcortical: Secondary | ICD-10-CM

## 2020-06-21 DIAGNOSIS — J9601 Acute respiratory failure with hypoxia: Secondary | ICD-10-CM

## 2020-06-21 DIAGNOSIS — R1310 Dysphagia, unspecified: Secondary | ICD-10-CM

## 2020-06-21 LAB — BLOOD GAS, VENOUS
Acid-Base Excess: 0.2 mmol/L (ref 0.0–2.0)
Bicarbonate: 23.8 mmol/L (ref 20.0–28.0)
FIO2: 40
O2 Saturation: 68.1 %
Patient temperature: 37.1
pCO2, Ven: 35.2 mmHg — ABNORMAL LOW (ref 44.0–60.0)
pH, Ven: 7.445 — ABNORMAL HIGH (ref 7.250–7.430)
pO2, Ven: 38.3 mmHg (ref 32.0–45.0)

## 2020-06-21 LAB — GLUCOSE, CAPILLARY
Glucose-Capillary: 121 mg/dL — ABNORMAL HIGH (ref 70–99)
Glucose-Capillary: 124 mg/dL — ABNORMAL HIGH (ref 70–99)
Glucose-Capillary: 129 mg/dL — ABNORMAL HIGH (ref 70–99)
Glucose-Capillary: 133 mg/dL — ABNORMAL HIGH (ref 70–99)
Glucose-Capillary: 135 mg/dL — ABNORMAL HIGH (ref 70–99)
Glucose-Capillary: 147 mg/dL — ABNORMAL HIGH (ref 70–99)

## 2020-06-21 LAB — CULTURE, RESPIRATORY W GRAM STAIN
Culture: NORMAL
Gram Stain: NONE SEEN

## 2020-06-21 LAB — SODIUM
Sodium: 155 mmol/L — ABNORMAL HIGH (ref 135–145)
Sodium: 155 mmol/L — ABNORMAL HIGH (ref 135–145)
Sodium: 153 mmol/L — ABNORMAL HIGH (ref 135–145)
Sodium: 152 mmol/L — ABNORMAL HIGH (ref 135–145)
Sodium: 152 mmol/L — ABNORMAL HIGH (ref 135–145)

## 2020-06-21 LAB — CULTURE, BLOOD (ROUTINE X 2)
Culture: NO GROWTH
Culture: NO GROWTH
Special Requests: ADEQUATE

## 2020-06-21 LAB — PHOSPHORUS: Phosphorus: 3.2 mg/dL (ref 2.5–4.6)

## 2020-06-21 MED ORDER — MIDAZOLAM HCL 2 MG/2ML IJ SOLN
INTRAMUSCULAR | Status: AC
  Filled 2020-06-21: qty 4

## 2020-06-21 MED ORDER — ETOMIDATE 2 MG/ML IV SOLN: 20.0000 mg | Freq: Once | INTRAVENOUS | Status: AC

## 2020-06-21 MED ORDER — ETOMIDATE 2 MG/ML IV SOLN
INTRAVENOUS | Status: AC
  Administered 2020-06-21: 20 mg via INTRAVENOUS
  Filled 2020-06-21: qty 20

## 2020-06-21 MED ORDER — SODIUM CHLORIDE 0.9 % IV BOLUS
1000.0000 mL | Freq: Once | INTRAVENOUS | Status: AC
  Administered 2020-06-21: 1000 mL via INTRAVENOUS

## 2020-06-21 MED ORDER — FENTANYL CITRATE (PF) 100 MCG/2ML IJ SOLN
100.0000 ug | Freq: Once | INTRAMUSCULAR | Status: AC
Start: 2020-06-22 — End: 2020-06-21

## 2020-06-21 MED ORDER — ROCURONIUM BROMIDE 10 MG/ML (PF) SYRINGE
PREFILLED_SYRINGE | INTRAVENOUS | Status: AC
  Administered 2020-06-21: 100 mg via INTRAVENOUS
  Filled 2020-06-21: qty 10

## 2020-06-21 MED ORDER — ROCURONIUM BROMIDE 10 MG/ML (PF) SYRINGE
PREFILLED_SYRINGE | INTRAVENOUS | Status: AC
  Filled 2020-06-21: qty 10

## 2020-06-21 MED ORDER — ROCURONIUM BROMIDE 10 MG/ML (PF) SYRINGE: 100.0000 mg | PREFILLED_SYRINGE | Freq: Once | INTRAVENOUS | Status: AC

## 2020-06-21 MED ORDER — FENTANYL CITRATE (PF) 100 MCG/2ML IJ SOLN
INTRAMUSCULAR | Status: AC
  Administered 2020-06-21: 100 ug via INTRAVENOUS
  Filled 2020-06-21: qty 2

## 2020-06-21 MED ORDER — ATROPINE SULFATE 1 MG/10ML IJ SOSY
PREFILLED_SYRINGE | INTRAMUSCULAR | Status: AC
  Filled 2020-06-21: qty 10

## 2020-06-21 MED ORDER — CHLORHEXIDINE GLUCONATE 0.12% ORAL RINSE (MEDLINE KIT)
15.0000 mL | Freq: Two times a day (BID) | OROMUCOSAL | Status: DC
  Administered 2020-06-21 – 2020-06-25 (×8): 15 mL via OROMUCOSAL

## 2020-06-21 MED ORDER — ATROPINE SULFATE 1 MG/ML IJ SOLN
INTRAMUSCULAR | Status: AC
  Filled 2020-06-21: qty 1

## 2020-06-21 MED ORDER — ORAL CARE MOUTH RINSE
15.0000 mL | OROMUCOSAL | Status: DC
  Administered 2020-06-21 – 2020-06-25 (×38): 15 mL via OROMUCOSAL

## 2020-06-21 MED ORDER — FENTANYL CITRATE (PF) 100 MCG/2ML IJ SOLN
INTRAMUSCULAR | Status: AC
  Filled 2020-06-21: qty 2

## 2020-06-21 NOTE — Progress Notes (Addendum)
Inpatient Rehabilitation Admissions Coordinator  I met with patient at bedside for assessment. No family present therfore I contacted his daughter, Mardene Celeste, by phone. 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 I discussed goals and expectations of a possible CIR admit pending caregiver support would be available for both he and his wife at home. Mardene Celeste will discuss with her brother, Shanon Brow. I await further progress with patient's ability to tolerate more therapies as well as caregiver supports clarified by family before pursuing a possible Cir admit.  Danne Baxter, RN, MSN Rehab Admissions Coordinator [phone] 06/21/2020 12:46 PM

## 2020-06-21 NOTE — Progress Notes (Signed)
STROKE TEAM PROGRESS NOTE   INTERVAL HISTORY No acute events but appears to be in mild respiratory distress with upper airway sounds.  Started on ceftriaxone yesterday for possible small aspiration PNA. CCM following d/t intubation risk.  He remains on hypertonic saline and serum sodium is optimal at 155 this morning.No seizure on EEG.  Speech therapy recommended n.p.o. status and he has a feeding tube.  Neurologically remains drowsy but can be easily aroused and follows midline simple commands on the right side. Vitals:   06/21/20 0800 06/21/20 0836 06/21/20 0900 06/21/20 0945  BP: (!) 189/74 (!) 154/55 (!) 148/55 (!) 137/57  Pulse: 95 83 89   Resp: (!) 29  (!) 25   Temp: 99.3 F (37.4 C)     TempSrc: Axillary     SpO2: 91%  93%   Weight:      Height:       CBC:  Recent Labs  Lab 06/19/20 0446 06/20/20 0433  WBC 12.2* 11.0*  NEUTROABS 8.8* 8.1*  HGB 11.5* 11.7*  HCT 32.9* 35.3*  MCV 94.8 99.4  PLT 175 186   Basic Metabolic Panel:  Recent Labs  Lab 06/19/20 0446 06/19/20 1002 06/20/20 0433 06/20/20 1035 06/20/20 2205 06/21/20 0422  NA 139   < > 152*   < > 155* 155*  K 3.9  --  4.3  --   --   --   CL 106  --  121*  --   --   --   CO2 24  --  24  --   --   --   GLUCOSE 136*  --  155*  --   --   --   BUN 35*  --  25*  --   --   --   CREATININE 0.97  --  0.72  --   --   --   CALCIUM 8.1*  --  8.1*  --   --   --   MG 2.5*  --  2.6*  --   --   --   PHOS 2.3*  --  2.2*  --   --  3.2   < > = values in this interval not displayed.   Lipid Panel:  Recent Labs  Lab 06/16/20 0021 06/17/20 0526 06/18/20 0257  CHOL 195  --   --   TRIG 312*   < > 190*  HDL 30*  --   --   CHOLHDL 6.5  --   --   VLDL 62*  --   --   LDLCALC 103*  --   --    < > = values in this interval not displayed.   HgbA1c:  Recent Labs  Lab 06/15/20 1941  HGBA1C 5.6   Urine Drug Screen:  Recent Labs  Lab 06/15/20 2037  LABOPIA NONE DETECTED  COCAINSCRNUR NONE DETECTED  LABBENZ NONE  DETECTED  AMPHETMU NONE DETECTED  THCU NONE DETECTED  LABBARB NONE DETECTED    Alcohol Level No results for input(s): ETH in the last 168 hours.  IMAGING past 24 hours ECHOCARDIOGRAM COMPLETE  Result Date: 06/17/2020 IMPRESSIONS   1. Left ventricular ejection fraction, by estimation, is 70 to 75%. The left ventricle has hyperdynamic function. The left ventricle has no regional wall motion abnormalities. Left ventricular diastolic parameters are consistent with Grade I diastolic dysfunction (impaired relaxation).   2. Right ventricular systolic function is normal. The right ventricular size is normal. Tricuspid regurgitation signal is inadequate for assessing PA pressure.  3. The mitral valve is normal in structure. Trivial mitral valve regurgitation. No evidence of mitral stenosis.   4. The aortic valve is tricuspid. Aortic valve regurgitation is not visualized. Mild aortic valve sclerosis is present, with no evidence of aortic valve stenosis.   5. The inferior vena cava is normal in size with greater than 50% respiratory variability, suggesting right atrial pressure of 3 mmHg.    IMAGING past 24 hours MR BRAIN W WO CONTRAST  Result Date: 06/15/2020 CLINICAL DATA:   IMPRESSION: 1. Innumerable foci of hemosiderin deposition scattered throughout the brain consistent with previous hemorrhagic infarctions. Intraparenchymal hematoma at the right frontoparietal junction with internal clot retraction, measuring 7.8 x 4.7 x 4.6 cm. No abnormal enhancement to suggest that this represents a hemorrhagic mass. Small hemorrhage in the left temporal lobe cannot be specifically identified by MRI as different than the other foci of hemosiderin deposition. 2. Small amount of subarachnoid hemorrhage within the sulci. 3. Mild mass-effect upon the right lateral ventricle with right-to-left shift 3 mm. Chronic small-vessel ischemic changes of the white matter elsewhere. The overall pattern could either be due to  amyloid angiopathy or hypertensive vascular disease. 4. No finding to suggest the presence metastatic disease. Electronically Signed   By: Paulina FusiMark  Shogry M.D.   On: 06/15/2020 22:56   DG CHEST PORT 1 VIEW  Result Date: 06/15/2020 CLINICAL DATA:  Unresponsive. EXAM: PORTABLE CHEST 1 VIEW COMPARISON:  None. FINDINGS: The heart size and mediastinal contours are within normal limits. Both lungs are clear. The visualized skeletal structures are unremarkable. IMPRESSION: No active disease. Electronically Signed   By: Lupita RaiderJames  Green Jr M.D.   On: 06/15/2020 20:29   CT HEAD CODE STROKE WO CONTRAST  Result Date: 06/15/2020 IMPRESSION:  1. Right frontoparietal parenchymal hemorrhage measuring 7.6 cm. Sub-5 mm left temporal hemorrhage.  2. No significant midline shift or ventricular entrapment.  3. Right greater than left cerebral convexity subarachnoid blood products.  4. ASPECTS is 10.   PHYSICAL EXAM Physical Exam Constitutional: Elderly Caucasian male  Psych: Affect appropriate to situation,flat but cooperative Eyes: No scleral injection HENT: No oropharyngeal obstruction, c-collar in place.  MSK: no joint deformities.  Cardiovascular: Normal rateto slight tachycardiaand regular rhythm.  Respiratory:Rhonchorous breath sounds GI: Soft. No distension. There is no tenderness.  Skin: Warm dry and intact visible skin  Neuro: Mental Status: Patient isdrowsy does not open eyes to voice,  follows simple midline and one-step commands on the right.  Suspect eye-opening apraxia Cranial Nerves: II: Visual Fields exam shows diminished blink to the left suggestive of a left hemianopia. Pupils are equal, round, and reactive to light.3 to 2 mm bilaterally III,IV, WU:[id number]:Right gaze preference, can track to midline V: Facial sensation isslightly reduced to eyelash brush on the left VII: Facial movement isnotable for a left facial droop.  VIII: hearing is intact to voice X: Uvuladifficult to  visualize XI: unable to test due to cooperation XII: tongue is midline without atrophy or fasciculations.  Motor: Tone isincreased in the left upper extremity and lower extremity. Bulk is normal.He moves the right side freely antigravity, at least 4/5. On the left upper weak WD, left lower triple flexion Sensory: Sensation appears reduced in the left arm and leg Deep Tendon Reflexes: 2+ and symmetric in the biceps and patellae. Plantars: Toes are upgoing on the left, down on the right   ASSESSMENT/PLAN  86 year old male with very minimal past medical history. Ate dinner with his wife at [id number] and having a conversationtill  1800 hrs. Abruptly at 1815 was found to be down. EMS on arrival found his blood pressure to be high systolic 200s with slurred speech and left sided weakness. He had an episode of emesis on route and was transiently hypoxemic.  Multifocal intracerebral hemorrhage, largest in the right MCA territory with associated right MCA syndrome, cerebral edema and brain compression .  Head CT with a large(greater than 30 cc, 7.6 x 4.7largest axial dimensions)intraparenchymal hemorrhage of the right insula with surrounding edema and mass-effect on the lateral ventricle without intraventricular extension. There is also a small left temporal punctate hyperdensity concerning for hemorrhage and subarachnoid blood bilaterally, right greater than left   CTA head & neck  Stable appearance of 7.7 cm right frontoparietal hemorrhage. 2-3 mm leftward midline shift, unchanged. Sub 5 mm left temporal hemorrhage and bilateral subarachnoid blood products, unchanged.   CTA neck: No high-grade narrowing or large vessel occlusion within the neck. Less than 50% narrowing of the left CCA and bilateral proximal ICAs.   CTA head: No large vessel occlusion. High-grade distal right V4 segment narrowing. Mild to moderate right M2, right ICA terminus, distal basilar artery and  bilateral PCA narrowing.   Cervical spine CT: No acute fracture or traumatic listhesis. Multilevel spondylosis most prominent at the C3-6 levels.   MRI  Innumerable foci of hemosiderin deposition scattered throughout the brain consistent with previous hemorrhagic infarctions.Intraparenchymal hematoma at the right frontoparietal junction with internal clot retraction, measuring 7.8 x 4.7 x 4.6 cm. No abnormal enhancement to suggest that this represents a hemorrhagic mass. Small hemorrhage in the left temporal lobe cannot be specifically identified by MRI as different than the other foci of hemosiderin deposition.Small amount of subarachnoid hemorrhage within the sulci. Mild mass-effect upon the right lateral ventricle with right-to-left shift 3 mm. Chronic small-vessel ischemic changes of the white matter elsewhere. The overall pattern could either be due to amyloid angiopathy or hypertensive vascular disease.   2D Echo: EF 70-75%, Grade I diastolic dysfunction  LDL 103  HgbA1c 5.6  VTE prophylaxis - Start Lovenox 40mg  daily  Recommended NPO per SLP.   Hold Anticoagulant/Antiplatelet medications in setting of IPH, SAH  No AC/AP prior to admission  CT head: Slight enlargement in the parenchymal hemorrhage in the right frontal parietal region as described. Minimal midline shift from  right to left  Therapy recommendations:  CIR  Disposition:  TBD  Cerebral Edema  Exam worsening with increase somnolence  Start 3% NaCl 31ml/hr  Sodium goal 150-155  Na 155 this morning   Check sodium level q6hrs, current 140  Bolus with 23.4% now, PICC line is in place  Hypertension  Home meds: none, no hx of HTN but with BP 200s       on EMS arrival  SBP goal <160   Currently on Norvasc 10mg  daily, coreg 25mg  bid  Weaned off cleviprex infusion currently  Labetalol 20mg  q2hr prn, hydralazine 20mg  q6hr        prn  Long-term BP goal normotensive  Hyperlipidemia  Home  meds:  None   LDL 103, goal < 70  high intensity statin at discharge  Continue statin at discharge  Other Stroke Risk Factors  Advanced Age >/= 45   Overweight, Body mass index is 28.29 kg/m., recommend weight loss, diet and exercise as appropriate   Dysphagia  Secondary to stroke  SLP following  Cortrak in place  Tube feeds  Other Problems: Concern for C Spine injury  CCM cleared cervical collar  CT cervical spine: No acute fracture or traumatic listhesis.  No acute fracture or traumatic listhesis.  ? NSTEMI/At risk for MI Cardiac monitoring  Troponin 30  Aspiration Pneumonia  Febrile: Tmax 37.4  Leukocytosis: WBC 19.7 -> 21.2 ->12.2->11   Xray: bibasilar opacities  On Ceftriaxone 1g daily for possible aspiration pneumonia  Blood cultures (2/26): no growth final Sputum culture (2/28): Negative  Hospital day # 6  Patient appears to have mild respiratory distress likely with we will upper airway secretions versus aspiration pneumonia.  With critical care team's assessment pending this.  Continue strict blood pressure control with systolic goal below 160 and serum sodium goal 150-155 presently hypertonic saline on hold.  Discussed with patient's wife and daughter at the bedside and answered questions.  Discussed with critical care team.  Nurse practitioner. This patient is critically ill and at significant risk of neurological worsening, death and care requires constant monitoring of vital signs, hemodynamics,respiratory and cardiac monitoring, extensive review of multiple databases, frequent neurological assessment, discussion with family, other specialists and medical decision making of high complexity.I have made any additions or clarifications directly to the above note.This critical care time does not reflect procedure time, or teaching time or supervisory time of PA/NP/Med Resident etc but could involve care discussion time.  I spent 30 minutes of  neurocritical care time  in the care of  this patient.  Delia Heady, MD  06/21/2020 10:21 AM   To contact Stroke Continuity provider, please refer to [email or web address]. After hours, contact General Neurology

## 2020-06-21 NOTE — Progress Notes (Signed)
NAME:  Calvin Yates, MRN:  [id number], DOB:  [birth year], LOS: 6 ADMISSION DATE:  06/15/2020, CONSULTATION DATE:  06/15/20 REFERRING MD:  Dr Iver Nestle - neuro, CHIEF COMPLAINT:   - Acute encephalopathy due to ICH  Brief History:  Acute encephalopathy due to ICH  History of Present Illness: Obtained from [facility] neuro hospitalist.  Patient unable to give history  86 year old male with very minimal past medical history.  Ate dinner with his wife at 1730 and having a conversation till 1800 hrs.  Abruptly at 1815 was found to be down.  EMS on arrival found his blood pressure to be high systolic 200s.  He had an episode of emesis on route and transiently hypoxemic.  But in the ER for the 90 minutes prior to CCM evaluation saturation 95% on room air and no further emesis.  He is drowsy but following commands with hemiparesis on the left side.  7 cm right intracranial hemorrhage frontoparietal region hemorrhage without midline shift.  Patient's not intubated.  He has a hard collar on because of his fall.  C-spine CT is pending.  CCM called because of high risk of worsening encephalopathy and respiratory failure  Significant Hospital Events:  06/15/2020 - admit  Consults:  06/15/2020  - ccm   Procedures:    Significant Diagnostic Tests:   CT HEAD 2/25 > Right frontoparietal parenchymal hemorrhage measuring 7.6 cm. Sub-5 mm left temporal hemorrhage. No significant midline shift or ventricular entrapment. Right greater than left cerebral convexity subarachnoid blood products.  CXR 06/18/2020       Interval placement of feeding tube that extends       below the diaphragm into the stomach. New bibasilar opacities may be       consistent with atelectasis but could also be consistent with       aspiration pneumonia given clinical history. No overt edema, pleural       fluid or pneumothorax.  CT Head wo contrast 06/19/2020 Slight enlargement in the parenchymal hemorrhage in the right frontal parietal region  as described. Minimal midline shift from right to left is noted. Stable small parenchymal hemorrhage in the left temporal lobe unchanged from the prior exam.   Micro Data:  06/18/2020 Respiratory Culture>> GS: ABUNDANT GRAM POSITIVE COCCI , ABUNDANT GRAM NEGATIVE RODS , MODERATE GRAM POSITIVE RODS Culture Pending>>  Antimicrobials:  Rocephin 06/18/2020>>  Interim History / Subjective:  No acute events overnight  Appears 3% stopped overnight due to Na meeting upper end goal of 155 On RA  Objective   Blood pressure (!) 145/52, pulse 87, temperature 98.6 F (37 C), temperature source Axillary, resp. rate (!) 21, height 5\' 8"  (1.727 m), weight 76.8 kg, SpO2 93 %.        Intake/Output Summary (Last 24 hours) at 06/21/2020 0704 Last data filed at 06/21/2020 0400 Gross per 24 hour  Intake 1519.03 ml  Output 1100 ml  Net 419.03 ml   Filed Weights   06/16/20 0600 06/20/20 0500 06/21/20 0500  Weight: 84.4 kg 84 kg 76.8 kg    Physical exam General: Acute ill appearing elderly male lying in bed in NAD HEENT: [place]/AT, MM pink/dry, PERRL,  Neuro: Will grunt to verbal stimuli but unable to state name or follow any commands, rhythmic motion to right foot but it appears somewhat purposeful  CV: s1s2 regular rate and rhythm, no murmur, rubs, or gallops,  PULM:  Diminished bilaterally with mild tachypnea, observed use of abdominal muscled with breathing,  GI: soft, bowel  sounds active in all 4 quadrants, non-tender, non-distended, tolerating TF Extremities: warm/dry, no edema  Skin: no rashes or lesions  Resolved Hospital Problem list   Concern for C-spine injury Hypophosphatemia  Assessment & Plan:   Large intracranial hemorrhage and associated acute encephalopathy and subsequent -Slight enlargement in the parenchymal hemorrhage with minimal R>L shify noted 3/1 repeat CT head High risk for acute respiratory failure requiring intubation, concern for aspiration -Remains largely stable today  though not much improved respiratory and mental status standpoint P: Management per neurology  3% saline per neuro, Na goal 150-155 Maintain neuro protective measures; goal for eurothermia, euglycemia, eunatermia, normoxia, and PCO2 goal of 35-40 Nutrition and bowel regiment  Seizure precautions  Aspirations precautions  SBp goal less than 160  Possible aspiration pneumonia -Per report, vomited in route to the hospital -Ceftriaxone initiated, sputum culture pending -WBC is downtrending today P: Close monitoring of respiratory status in the ICU HOB elevated at 30 degrees Follow cultures Resp culture with normal flora  Complete 5 days of IV ceftriaxone, stop date 3/4  Hypertensive emergency associated with intracranial hemorrhage -B/P per EMS was systolicly in the 200s -Echo with EF 70 to 75% and grade 1 diastolic dysfunction P: Continue PO antihypertensives  PRN hydralazine Close monitoring of hemodynamics in the ICU setting   Best practice (evaluated daily)  Diet: Tube feeds Pain/Anxiety/Delirium protocol (if indicated): Currently none VAP protocol (if indicated):n/a DVT prophylaxis: Lovenox GI prophylaxis: PPI Glucose control: SSI Mobility: Bedrest Disposition: Neuro ICU for evolving stroke, hypertensive crisis  Goals of Care:   Family Updates: Per primary   LABS    PULMONARY Recent Labs  Lab 06/15/20 1937  TCO2 23    CBC Recent Labs  Lab 06/18/20 0257 06/19/20 0446 06/20/20 0433  HGB 13.0 11.5* 11.7*  HCT 37.8* 32.9* 35.3*  WBC 21.2* 12.2* 11.0*  PLT 220 175 186    COAGULATION Recent Labs  Lab 06/15/20 1934  INR 1.0    CARDIAC  No results for input(s): TROPONINI in the last 168 hours. No results for input(s): PROBNP in the last 168 hours.   CHEMISTRY Recent Labs  Lab 06/16/20 0021 06/17/20 0526 06/18/20 0257 06/18/20 1616 06/19/20 0446 06/19/20 1002 06/20/20 0433 06/20/20 1035 06/20/20 1249 06/20/20 1733 06/20/20 2205  NA  134* 135 135  --  139   < > 152* 175* 155* 155* 155*  K 4.0 4.0 4.6  --  3.9  --  4.3  --   --   --   --   CL 103 102 104  --  106  --  121*  --   --   --   --   CO2 22 22 18*  --  24  --  24  --   --   --   --   GLUCOSE 164* 145* 141*  --  136*  --  155*  --   --   --   --   BUN 20 24* 29*  --  35*  --  25*  --   --   --   --   CREATININE 1.08 1.23 1.17  --  0.97  --  0.72  --   --   --   --   CALCIUM 8.7* 8.5* 8.5*  --  8.1*  --  8.1*  --   --   --   --   MG 2.3  --   --  2.6* 2.5*  --  2.6*  --   --   --   --  PHOS 2.8 4.1 3.3  --  2.3*  --  2.2*  --   --   --   --    < > = values in this interval not displayed.   Estimated Creatinine Clearance: 66.5 mL/min (by C-G formula based on SCr of 0.72 mg/dL).   LIVER Recent Labs  Lab 06/15/20 1934 06/16/20 0021  AST 24 30  ALT 23 23  ALKPHOS 44 76  BILITOT 0.8 0.4  PROT 7.0 7.0  ALBUMIN 4.1 4.0  INR 1.0  --      INFECTIOUS Recent Labs  Lab 06/16/20 0021 06/17/20 0526 06/18/20 0257  LATICACIDVEN 1.9  --   --   PROCALCITON <0.10 <0.10 0.13     ENDOCRINE CBG (last 3)  Recent Labs    06/20/20 2010 06/20/20 2333 06/21/20 0350  GLUCAP 154* 143* 133*     Critical Care:    Performed by: Delfin Gant  Total critical care time: 38 minutes  Critical care time was exclusive of separately billable procedures and treating other patients.  Critical care was necessary to treat or prevent imminent or life-threatening deterioration.  Critical care was time spent personally by me on the following activities: development of treatment plan with patient and/or surrogate as well as nursing, discussions with consultants, evaluation of patient's response to treatment, examination of patient, obtaining history from patient or surrogate, ordering and performing treatments and interventions, ordering and review of laboratory studies, ordering and review of radiographic studies, pulse oximetry and re-evaluation of patient's  condition.   Delfin Gant, NP-C [facility] Personal contact information can be found on Amion  If no response please page: Adult pulmonary and critical care medicine pager on Amion unitl 7pm After 7pm please call [phone] 06/21/2020, 7:47 AM

## 2020-06-21 NOTE — Procedures (Signed)
Name: Calvin Yates MRN: [id number] DOB: [birth year]   PROCEDURE NOTE  Procedure:  Endotracheal intubation.  Indication:  Acute respiratory failure  Consent:  Consent was implied due to the emergency nature of the procedure.  Anesthesia:  A total of 10 mg of Etomidate was given intravenously.  Procedure summary:  Appropriate equipment was assembled. The patient was identified as Calvin Yates and safety timeout was performed. The patient was placed supine, with head in sniffing position. After adequate level of anesthesia was achieved, a 4 blade was inserted into the oropharynx and the vocal cords were visualized. A 8 endotracheal tube was inserted with difficulty  Patient had thick dry secretions on top of the vocal cords and Magill forceps had to be used to  visualized going through the vocal cords. The stylette was removed and cuff inflated. Colorimetric change was noted on the CO2 meter. Breath sounds were heard over both lung fields equally. ETT was secured at 25 cm lip line.  Post procedure chest xray was ordered.  Complications:  No immediate complications were noted.  Hemodynamic parameters and oxygenation remained stable throughout the procedure.     06/21/2020, 11:29 PM

## 2020-06-21 NOTE — Progress Notes (Addendum)
INTUBATION DOCUMENTATION  2227: Patient showing increasing work of breathing, diaphoresis and increased RR on 100% face mask  2240: Patient having bradycardia episodes as low as the 40s. Severe expiratory wheezes heard on auscultation   2253: Began ventilating with BVM   2304: 100 fentanyl given  2305: 20 etomidate and 100 rocuronium   2311: BP dropped to 89/45 (59). 1 L NS bolus given  2313: Patient successfully intubated. Large amount of black/brown dried bloody secretions seen on glidescope in trachea.   2318 :3 inch length dried secretion cluster removed from trachea   2325: VSS. HR 92, 100% SpO2 on 100% ventilator, 16 RR, 131/52 (76) BP.

## 2020-06-21 NOTE — Progress Notes (Signed)
EEG complete - results pending 

## 2020-06-21 NOTE — Progress Notes (Signed)
Physical Therapy Treatment Patient Details Name: Calvin Yates MRN: [id number] DOB: 07-17-1934 Today's Date: 06/21/2020    History of Present Illness The pt is an 86 yo male presenting with elevated BP and tachycardia after being found down by his wife. 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    PT Comments    The pt was agreeable to session, initially with goal of progressing OOB to recliner, but limited due to pt somnolence and slightly increased lethargy today. The pt required maxA of 2 to come to sitting EOB, and mod/max A despite repositioning of hands, use of lateral leaning and propping, and positional cues to maintain static sitting EOB. The pt tolerated sit-stand x2 from EOB, but required maxA of 2 with use of bed pad to facilitate hip extension with attempt to stand. The pt will continue to benefit from skilled PT to progress functional stability, strength, and processing/motor control to allow for improved seated tolerance and capacity for OOB transfers.    Follow Up Recommendations  CIR     Equipment Recommendations   (defer to post acute)    Recommendations for Other Services       Precautions / Restrictions Precautions Precautions: Fall Precaution Comments: pt c-spine cleared, no need for c-collar Restrictions Weight Bearing Restrictions: No    Mobility  Bed Mobility Overal bed mobility: Needs Assistance Bed Mobility: Rolling;Sidelying to Sit;Sit to Supine Rolling: Max assist;+2 for physical assistance Sidelying to sit: Max assist;+2 for physical assistance   Sit to supine: Max assist;+2 for physical assistance   General bed mobility comments: pt attempting to assist with RUE, but unable ot generate enough power to assist with movement of trunk or scooting hips. Pt required maxA of 2 to safely complete movement of BLE, pivoting to EOB with use of pad, and to maintain seated balance    Transfers Overall  transfer level: Needs assistance Equipment used: 2 person hand held assist Transfers: Sit to/from Stand Sit to Stand: Total assist;+2 physical assistance;From elevated surface         General transfer comment: attempted sit-stand from elevated EOB x2, but pt with decreased activation of RLE to assist with stand despite increased cues, processing time, and multimodal facilitations. Pt given maxA of 2 as well as assist from bed pad to assist with hip lift.  Ambulation/Gait             General Gait Details: unable      Modified Rankin (Stroke Patients Only) Modified Rankin (Stroke Patients Only) Pre-Morbid Rankin Score: No symptoms Modified Rankin: Severe disability     Balance Overall balance assessment: Needs assistance Sitting-balance support: Feet supported;Single extremity supported Sitting balance-Leahy Scale: Zero Sitting balance - Comments: Pt with less pushing and L lateral lean today however remains unable to use L UE functionally to support self Postural control: Posterior lean Standing balance support: Bilateral upper extremity supported Standing balance-Leahy Scale: Zero Standing balance comment: totalA of 2 to reach complete stand                            Cognition Arousal/Alertness: Awake/alert Behavior During Therapy: WFL for tasks assessed/performed Overall Cognitive Status: Impaired/Different from baseline Area of Impairment: Following commands;Awareness;Problem solving;Attention                       Following Commands: Follows one step commands consistently;Follows one step commands with increased time (consistently with  R extremities)   Awareness: Intellectual Problem Solving: Slow processing;Decreased initiation;Difficulty sequencing;Requires verbal cues;Requires tactile cues General Comments: pt responding to all commands with R-sided extremities. No active movement or command following with L-sided extremities or core  control/positioning. Pt required increased time and manual facilitation to initiate some movements, but needing fewer cues with repeated reps      Exercises General Exercises - Lower Extremity Long Arc Quad: AROM;Right;PROM;Left;10 reps;Seated Other Exercises Other Exercises: lateral leaning to R with cues to maintain upright as well as to press up with RUE to sitting. Pt with increased cues to return to midline, manual facilitation and tactile cues initially to RUE, pt with increased initiation with reps    General Comments General comments (skin integrity, edema, etc.): VSS on RA, BP remained <160, HR to 100s.      Pertinent Vitals/Pain Pain Assessment: Faces Faces Pain Scale: Hurts a little bit Pain Location: grimacing with repositioning Pain Descriptors / Indicators: Discomfort;Grimacing Pain Intervention(s): Monitored during session;Repositioned           PT Goals (current goals can now be found in the care plan section) Acute Rehab PT Goals Patient Stated Goal: none stated PT Goal Formulation: Patient unable to participate in goal setting Time For Goal Achievement: 06/30/20 Potential to Achieve Goals: Fair Progress towards PT goals: Progressing toward goals    Frequency    Min 4X/week      PT Plan Current plan remains appropriate       AM-PAC PT "6 Clicks" Mobility   Outcome Measure  Help needed turning from your back to your side while in a flat bed without using bedrails?: Total Help needed moving from lying on your back to sitting on the side of a flat bed without using bedrails?: Total Help needed moving to and from a bed to a chair (including a wheelchair)?: Total Help needed standing up from a chair using your arms (e.g., wheelchair or bedside chair)?: Total Help needed to walk in hospital room?: Total Help needed climbing 3-5 steps with a railing? : Total 6 Click Score: 6    End of Session   Activity Tolerance: Patient tolerated treatment  well Patient left: in bed;with call bell/phone within reach;with bed alarm set;with family/visitor present Nurse Communication: Need for lift equipment PT Visit Diagnosis: Hemiplegia and hemiparesis Hemiplegia - Right/Left: Left Hemiplegia - dominant/non-dominant: Non-dominant Hemiplegia - caused by: Nontraumatic intracerebral hemorrhage     Time: 4782-9562 PT Time Calculation (min) (ACUTE ONLY): 25 min  Charges:  $Therapeutic Activity: 23-37 mins                     Rolm Baptise, PT, DPT   Acute Rehabilitation Department Pager #: [phone]   Gaetana Michaelis 06/21/2020, 9:58 AM

## 2020-06-21 NOTE — Procedures (Signed)
Patient Name: Calvin Yates  MRN: [id number]  Epilepsy Attending: Charlsie Quest  Referring Physician/Provider: Dr. Delia Heady Date: 06/21/2020 Duration: 21.29 mins  Patient history: 86 year old male with right MCA infarct.  EEG to evaluate for seizures.  Level of alertness: Awake, asleep  AEDs during EEG study: None  Technical aspects: This EEG study was done with scalp electrodes positioned according to the 10-20 International system of electrode placement. Electrical activity was acquired at a sampling rate of 500Hz  and reviewed with a high frequency filter of 70Hz  and a low frequency filter of 1Hz . EEG data were recorded continuously and digitally stored.   Description: The posterior dominant rhythm consists of 8 Hz activity of moderate voltage (25-35 uV) seen predominantly in posterior head regions, symmetric and reactive to eye opening and eye closing. Sleep was characterized by vertex waves, sleep spindles (12 to 14 Hz), maximal frontocentral region. EEG showed continuous 3 to 6 Hz theta-delta slowing in right frontotemporal region. Hyperventilation and photic stimulation were not performed.     ABNORMALITY -Continuous slow, right frontotemporal region  IMPRESSION: This study is suggestive of cortical dysfunction in right frontotemporal region likely secondary to underlying stroke.  No seizures or epileptiform discharges were seen throughout the recording.  Anthon [name] 

## 2020-06-22 DIAGNOSIS — R0902 Hypoxemia: Secondary | ICD-10-CM | POA: Diagnosis not present

## 2020-06-22 DIAGNOSIS — I610 Nontraumatic intracerebral hemorrhage in hemisphere, subcortical: Secondary | ICD-10-CM | POA: Diagnosis not present

## 2020-06-22 DIAGNOSIS — Z789 Other specified health status: Secondary | ICD-10-CM | POA: Diagnosis not present

## 2020-06-22 LAB — GLUCOSE, CAPILLARY
Glucose-Capillary: 148 mg/dL — ABNORMAL HIGH (ref 70–99)
Glucose-Capillary: 130 mg/dL — ABNORMAL HIGH (ref 70–99)
Glucose-Capillary: 146 mg/dL — ABNORMAL HIGH (ref 70–99)
Glucose-Capillary: 126 mg/dL — ABNORMAL HIGH (ref 70–99)
Glucose-Capillary: 185 mg/dL — ABNORMAL HIGH (ref 70–99)
Glucose-Capillary: 143 mg/dL — ABNORMAL HIGH (ref 70–99)

## 2020-06-22 LAB — BLOOD GAS, ARTERIAL
Acid-base deficit: 2 mmol/L (ref 0.0–2.0)
Bicarbonate: 21.7 mmol/L (ref 20.0–28.0)
Drawn by: 42624
FIO2: 75
O2 Saturation: 98.4 %
Patient temperature: 36.6
pCO2 arterial: 32.4 mmHg (ref 32.0–48.0)
pH, Arterial: 7.439 (ref 7.350–7.450)
pO2, Arterial: 119 mmHg — ABNORMAL HIGH (ref 83.0–108.0)

## 2020-06-22 LAB — BRAIN NATRIURETIC PEPTIDE: B Natriuretic Peptide: 346.5 pg/mL — ABNORMAL HIGH (ref 0.0–100.0)

## 2020-06-22 LAB — BASIC METABOLIC PANEL
Anion gap: 8 (ref 5–15)
BUN: 39 mg/dL — ABNORMAL HIGH (ref 8–23)
CO2: 21 mmol/L — ABNORMAL LOW (ref 22–32)
Calcium: 8.3 mg/dL — ABNORMAL LOW (ref 8.9–10.3)
Chloride: 124 mmol/L — ABNORMAL HIGH (ref 98–111)
Creatinine, Ser: 0.97 mg/dL (ref 0.61–1.24)
GFR, Estimated: >60 mL/min (ref >60)
Glucose, Bld: 160 mg/dL — ABNORMAL HIGH (ref 70–99)
Potassium: 4.2 mmol/L (ref 3.5–5.1)
Sodium: 153 mmol/L — ABNORMAL HIGH (ref 135–145)

## 2020-06-22 LAB — SODIUM
Sodium: 153 mmol/L — ABNORMAL HIGH (ref 135–145)
Sodium: 153 mmol/L — ABNORMAL HIGH (ref 135–145)
Sodium: 153 mmol/L — ABNORMAL HIGH (ref 135–145)

## 2020-06-22 MED ORDER — FENTANYL CITRATE (PF) 100 MCG/2ML IJ SOLN
25.0000 ug | INTRAMUSCULAR | Status: DC | PRN
  Administered 2020-06-22: 100 ug via INTRAVENOUS
  Administered 2020-06-22: 50 ug via INTRAVENOUS
  Administered 2020-06-23 – 2020-06-25 (×2): 100 ug via INTRAVENOUS
  Filled 2020-06-22 (×5): qty 2

## 2020-06-22 MED ORDER — FENTANYL CITRATE (PF) 100 MCG/2ML IJ SOLN
25.0000 ug | INTRAMUSCULAR | Status: DC | PRN
Start: 2020-06-22 — End: 2020-06-29

## 2020-06-22 MED ORDER — DEXMEDETOMIDINE HCL IN NACL 400 MCG/100ML IV SOLN
0.0000 ug/kg/h | INTRAVENOUS | Status: AC
  Administered 2020-06-22: 1.2 ug/kg/h via INTRAVENOUS
  Administered 2020-06-22: 1 ug/kg/h via INTRAVENOUS
  Administered 2020-06-22: 1.173 ug/kg/h via INTRAVENOUS
  Administered 2020-06-23: 0.6 ug/kg/h via INTRAVENOUS
  Administered 2020-06-23 (×2): 1.2 ug/kg/h via INTRAVENOUS
  Administered 2020-06-23: 0.6 ug/kg/h via INTRAVENOUS
  Administered 2020-06-24: 0.4 ug/kg/h via INTRAVENOUS
  Administered 2020-06-24: 0.6 ug/kg/h via INTRAVENOUS
  Administered 2020-06-25: 1 ug/kg/h via INTRAVENOUS
  Administered 2020-06-25: 0.8 ug/kg/h via INTRAVENOUS
  Filled 2020-06-22: qty 100
  Filled 2020-06-22: qty 200
  Filled 2020-06-22 (×7): qty 100

## 2020-06-22 MED ORDER — PROPOFOL 1000 MG/100ML IV EMUL
5.0000 ug/kg/min | INTRAVENOUS | Status: DC
  Administered 2020-06-22: 5 ug/kg/min via INTRAVENOUS
  Filled 2020-06-22: qty 100

## 2020-06-22 NOTE — Progress Notes (Signed)
Inpatient Rehab Admissions Coordinator:  Pt intubated overnight.  Will continue to follow medical workup and progress with therapies.   Wolfgang Phoenix, MS, CCC-SLP Admissions Coordinator [phone]

## 2020-06-22 NOTE — Progress Notes (Signed)
SLP Cancellation Note  Patient Details Name: Calvin Yates MRN: [id number] DOB: 1934-08-15   Cancelled treatment:       Reason Eval/Treat Not Completed: Patient not medically ready. Intubated. Will sign off at this time and await new orders.    DeBlois, Riley Nearing 06/22/2020, 3:08 PM

## 2020-06-22 NOTE — Progress Notes (Signed)
NAME:  Calvin Yates, MRN:  [id number], DOB:  01/01/1935, LOS: 7 ADMISSION DATE:  06/15/2020, CONSULTATION DATE:  06/15/20 REFERRING MD:  Dr Iver Nestle - neuro, CHIEF COMPLAINT:   - Acute encephalopathy due to ICH  Brief History:  Acute encephalopathy due to ICH  History of Present Illness:  86 year old male with very minimal past medical history.  Ate dinner with his wife at 1730 and having a conversation till 1800 hrs.  Abruptly at 1815 was found to be down.  EMS on arrival found his blood pressure to be high systolic 200s.  He had an episode of emesis on route and transiently hypoxemic.  But in the ER for the 90 minutes prior to CCM evaluation saturation 95% on room air and no further emesis.  He is drowsy but following commands with hemiparesis on the left side.  7 cm right intracranial hemorrhage frontoparietal region hemorrhage without midline shift.  Patient's not intubated.  He has a hard collar on because of his fall.  C-spine CT is pending.  CCM called because of high risk of worsening encephalopathy and respiratory failure  Significant Hospital Events:  06/15/2020 - admit  Consults:  06/15/2020  - ccm   Procedures:    Significant Diagnostic Tests:   CT HEAD 2/25 > Right frontoparietal parenchymal hemorrhage measuring 7.6 cm. Sub-5 mm left temporal hemorrhage. No significant midline shift or ventricular entrapment. Right greater than left cerebral convexity subarachnoid blood products.  CXR 06/18/2020       Interval placement of feeding tube that extends       below the diaphragm into the stomach. New bibasilar opacities may be       consistent with atelectasis but could also be consistent with       aspiration pneumonia given clinical history. No overt edema, pleural       fluid or pneumothorax.  CT Head wo contrast 06/19/2020 Slight enlargement in the parenchymal hemorrhage in the right frontal parietal region as described. Minimal midline shift from right to left is noted. Stable  small parenchymal hemorrhage in the left temporal lobe unchanged from the prior exam.   Micro Data:  RVP panel 2/25 > negative  MRSA PCR 2/25 > negative  Respiratory Culture 2/28 >> Normal flora  Blood culture 2/28 > negative   Antimicrobials:  Rocephin 06/18/2020 >>  Interim History / Subjective:  Acute respiratory distress overnight resulting in intubation   Objective   Blood pressure (!) 115/42, pulse 75, temperature 98.6 F (37 C), temperature source Oral, resp. rate (!) 22, height 5\' 8"  (1.727 m), weight 78.4 kg, SpO2 100 %.    Vent Mode: PRVC FiO2 (%):  [28 %-100 %] 50 % Set Rate:  [16 bmp] 16 bmp Vt Set:  [550 mL] 550 mL PEEP:  [5 cmH20] 5 cmH20 Plateau Pressure:  [13 cmH20-15 cmH20] 13 cmH20   Intake/Output Summary (Last 24 hours) at 06/22/2020 0810 Last data filed at 06/22/2020 0700 Gross per 24 hour  Intake 2706.05 ml  Output 1750 ml  Net 956.05 ml   Filed Weights   06/20/20 0500 06/21/20 0500 06/22/20 0500  Weight: 84 kg 76.8 kg 78.4 kg    Physical exam General: Chronically ill appearing elderly male lying in bed on mechanical ventilation, in NAD HEENT: ETT, MM pink/moist, PERRL,  Neuro: Spontaneous movement in right upper and lower extremity, sedated on vent  CV: s1s2 regular rate and rhythm, no murmur, rubs, or gallops,  PULM:  Mechanical breath sounds, tolerating vent well,  no added breath sounds  GI: soft, bowel sounds active in all 4 quadrants, non-tender, non-distended, tolerating TF Extremities: warm/dry, no edema  Skin: no rashes or lesions  Resolved Hospital Problem list   Concern for C-spine injury Hypophosphatemia  Assessment & Plan:   Large intracranial hemorrhage and associated acute encephalopathy and subsequent -Slight enlargement in the parenchymal hemorrhage with minimal R>L shify noted 3/1 repeat CT head High risk for acute respiratory failure requiring intubation, concern for aspiration -Remains largely stable today though not much  improved respiratory and mental status standpoint P: Management per neurology  3# saline off due to hypernatremia  Maintain neuro protective measures; goal for eurothermia, euglycemia, eunatermia, normoxia, and PCO2 goal of 35-40 Nutrition and bowel regiment  Seizure precautions  Aspirations precautions  S/P goal less than 160  Acute Hypoxic / Hypercapnic Respiratory Failure  -Required urgent intubation overnight 3/4 Aspiration pneumonia -Per report, vomited in route to the hospital -Respiratory culture with normal respiratory flora  P: Continue ventilator support with lung protective strategies  Wean PEEP and FiO2 for sats greater than 90%. Head of bed elevated 30 degrees. Plateau pressures less than 30 cm H20.  Follow intermittent chest x-ray and ABG.   SAT/SBT as tolerated, mentation preclude extubation  Ensure adequate pulmonary hygiene  VAP bundle in place  PAD protocol Ceftriaxone x5 days   Hypertensive emergency associated with intracranial hemorrhage -B/P per EMS was systolicly in the 200s -Echo with EF 70 to 75% and grade 1 diastolic dysfunction P: PO antihypertensive via Cortrack  PRN IV hydralazine   Best practice (evaluated daily)  Diet: Tube feeds Pain/Anxiety/Delirium protocol (if indicated): Currently none VAP protocol (if indicated):n/a DVT prophylaxis: Lovenox GI prophylaxis: PPI Glucose control: SSI Mobility: Bedrest Disposition: Neuro ICU for evolving stroke, hypertensive crisis  Goals of Care:   Family Updates: Per primary   LABS    PULMONARY Recent Labs  Lab 06/15/20 1937 06/21/20 1742 06/22/20 0150  PHART  --   --  7.439  PCO2ART  --   --  32.4  PO2ART  --   --  119*  HCO3  --  23.8 21.7  TCO2 23  --   --   O2SAT  --  68.1 98.4    CBC Recent Labs  Lab 06/18/20 0257 06/19/20 0446 06/20/20 0433  HGB 13.0 11.5* 11.7*  HCT 37.8* 32.9* 35.3*  WBC 21.2* 12.2* 11.0*  PLT 220 175 186    COAGULATION Recent Labs  Lab  06/15/20 1934  INR 1.0    CARDIAC  No results for input(s): TROPONINI in the last 168 hours. No results for input(s): PROBNP in the last 168 hours.   CHEMISTRY Recent Labs  Lab 06/16/20 0021 06/17/20 0526 06/18/20 0257 06/18/20 1616 06/19/20 0446 06/19/20 1002 06/20/20 0433 06/20/20 1035 06/21/20 0422 06/21/20 0952 06/21/20 1548 06/21/20 2128 06/22/20 0352  NA 134* 135 135  --  139   < > 152*   < > 155* 153* 152* 152* 153*  K 4.0 4.0 4.6  --  3.9  --  4.3  --   --   --   --   --  4.2  CL 103 102 104  --  106  --  121*  --   --   --   --   --  124*  CO2 22 22 18*  --  24  --  24  --   --   --   --   --  21*  GLUCOSE  164* 145* 141*  --  136*  --  155*  --   --   --   --   --  160*  BUN 20 24* 29*  --  35*  --  25*  --   --   --   --   --  39*  CREATININE 1.08 1.23 1.17  --  0.97  --  0.72  --   --   --   --   --  0.97  CALCIUM 8.7* 8.5* 8.5*  --  8.1*  --  8.1*  --   --   --   --   --  8.3*  MG 2.3  --   --  2.6* 2.5*  --  2.6*  --   --   --   --   --   --   PHOS 2.8 4.1 3.3  --  2.3*  --  2.2*  --  3.2  --   --   --   --    < > = values in this interval not displayed.   Estimated Creatinine Clearance: 54.8 mL/min (by C-G formula based on SCr of 0.97 mg/dL).   LIVER Recent Labs  Lab 06/15/20 1934 06/16/20 0021  AST 24 30  ALT 23 23  ALKPHOS 44 76  BILITOT 0.8 0.4  PROT 7.0 7.0  ALBUMIN 4.1 4.0  INR 1.0  --      INFECTIOUS Recent Labs  Lab 06/16/20 0021 06/17/20 0526 06/18/20 0257  LATICACIDVEN 1.9  --   --   PROCALCITON <0.10 <0.10 0.13     ENDOCRINE CBG (last 3)  Recent Labs    06/21/20 1945 06/21/20 2350 06/22/20 0355  GLUCAP 124* 129* 148*     Critical Care:    Performed by: Delfin Gant  Total critical care time: 38 minutes  Critical care time was exclusive of separately billable procedures and treating other patients.  Critical care was necessary to treat or prevent imminent or life-threatening deterioration.  Critical care  was time spent personally by me on the following activities: development of treatment plan with patient and/or surrogate as well as nursing, discussions with consultants, evaluation of patient's response to treatment, examination of patient, obtaining history from patient or surrogate, ordering and performing treatments and interventions, ordering and review of laboratory studies, ordering and review of radiographic studies, pulse oximetry and re-evaluation of patient's condition.   Delfin Gant, NP-C [facility] Personal contact information can be found on Amion  If no response please page: Adult pulmonary and critical care medicine pager on Amion unitl 7pm After 7pm please call [phone] 06/22/2020, 8:10 AM

## 2020-06-22 NOTE — Progress Notes (Addendum)
eLink Physician-Brief Progress Note Patient Name: Calvin Yates DOB: [birth year] MRN: [id number]   Date of Service  06/22/2020  HPI/Events of Note  Multiple issues: 1. Need Ventilator orders, 2. Need orders for ABG and sedation and 3. Need physician to call family and update.   eICU Interventions  Plan: 1. Ventilator orders: 100%/PRVC 16/TV 550/P 5. 2. ABG at 1 AM. 3. Propofol IV infusion. Titrate to RASS = 0 to -1.  4. Attempted to call Son, [name], and update about intubation. No answer. Left voicemail to inform him of his father's intubation.      Intervention Category Major Interventions: Respiratory failure - evaluation and management  Jamear Carbonneau Eugene 06/22/2020, 12:15 AM

## 2020-06-22 NOTE — Progress Notes (Signed)
Nutrition Follow-up  DOCUMENTATION CODES:   Not applicable  INTERVENTION:   Continue tube feeding via Cortrak tube: Osmolite 1.5 at 55 ml/h (1320 ml per day) Prosource TF 45 ml BID  Provides 2060 kcal, 104 gm protein, 1003 ml free water daily   NUTRITION DIAGNOSIS:   Inadequate oral intake related to inability to eat as evidenced by NPO status. Ongoing  GOAL:   Patient will meet greater than or equal to 90% of their needs Met with TF.   MONITOR:   TF tolerance  REASON FOR ASSESSMENT:   Consult Enteral/tube feeding initiation and management  ASSESSMENT:   Pt with no PMH admitted after being found down at home with multifocal ICHs with largest in the R MCA likely hypertensive.  Per RN pt required intubation due to copious thick secretions. Spoke with RN, no bm documented since admission. RN to administer PRN dulcolax.  No family present during visit.   2/26 failed swallow eval  2/28 cortrak placed; tip gastric  3/3 pt intubated overnight for acute respiratory failure  Patient is currently intubated on ventilator support MV: 12 L/min Temp (24hrs), Avg:98.4 F (36.9 C), Min:97.9 F (36.6 C), Max:99 F (37.2 C)  Medications reviewed and include: SSI, MVI with minerals, senokot-s  Precedex  Labs reviewed: Na 153 (hypertonic saline off currently) CBG's: 130-148    Diet Order:   Diet Order            Diet NPO time specified  Diet effective now                 EDUCATION NEEDS:   No education needs have been identified at this time  Skin:  Skin Assessment: Reviewed RN Assessment  Last BM:  none documented since admission  Height:   Ht Readings from Last 1 Encounters:  06/21/20 5' 8"  (1.727 m)    Weight:   Wt Readings from Last 1 Encounters:  06/22/20 78.4 kg    Ideal Body Weight:  70 kg  BMI:  Body mass index is 26.28 kg/m.  Estimated Nutritional Needs:   Kcal:  1900-2100  Protein:  100-115 grams  Fluid:  >1.9 L/day  Lockie Pares., RD, LDN, CNSC See AMiON for contact information

## 2020-06-22 NOTE — Progress Notes (Signed)
OT Cancellation Note  Patient Details Name: Calvin Yates MRN: [id number] DOB: September 30, 1934   Cancelled Treatment:    Reason Eval/Treat Not Completed: Other (comment) (Hold per RN this AM. Will return as schedule allows and pt medically ready.)  Tirth Cothron M Chandler Swiderski Rutger Salton MSOT, OTR/L Acute Rehab Pager: [phone] Office: [phone] 06/22/2020, 7:53 AM

## 2020-06-22 NOTE — Progress Notes (Addendum)
STROKE TEAM PROGRESS NOTE   INTERVAL HISTORY Intubated overnight. Sedated on precedex.  Purposeful on the right. On ceftriaxone since 3/2 for possible small aspiration PNA. CCM following d/t intubation risk.  Vitals:   06/22/20 0500 06/22/20 0600 06/22/20 0700 06/22/20 0735  BP: (!) 122/49 (!) 126/49 (!) 115/42   Pulse: 76 83 75   Resp: 20 (!) 26 (!) 22   Temp:      TempSrc:      SpO2: 100% 100% 100% 100%  Weight: 78.4 kg     Height:       CBC:  Recent Labs  Lab 06/19/20 0446 06/20/20 0433  WBC 12.2* 11.0*  NEUTROABS 8.8* 8.1*  HGB 11.5* 11.7*  HCT 32.9* 35.3*  MCV 94.8 99.4  PLT 175 186   Basic Metabolic Panel:  Recent Labs  Lab 06/19/20 0446 06/19/20 1002 06/20/20 0433 06/20/20 1035 06/21/20 0422 06/21/20 0952 06/21/20 2128 06/22/20 0352  NA 139   < > 152*   < > 155*   < > 152* 153*  K 3.9  --  4.3  --   --   --   --  4.2  CL 106  --  121*  --   --   --   --  124*  CO2 24  --  24  --   --   --   --  21*  GLUCOSE 136*  --  155*  --   --   --   --  160*  BUN 35*  --  25*  --   --   --   --  39*  CREATININE 0.97  --  0.72  --   --   --   --  0.97  CALCIUM 8.1*  --  8.1*  --   --   --   --  8.3*  MG 2.5*  --  2.6*  --   --   --   --   --   PHOS 2.3*  --  2.2*  --  3.2  --   --   --    < > = values in this interval not displayed.   Lipid Panel:  Recent Labs  Lab 06/16/20 0021 06/17/20 0526 06/18/20 0257  CHOL 195  --   --   TRIG 312*   < > 190*  HDL 30*  --   --   CHOLHDL 6.5  --   --   VLDL 62*  --   --   LDLCALC 103*  --   --    < > = values in this interval not displayed.   HgbA1c:  Recent Labs  Lab 06/15/20 1941  HGBA1C 5.6   Urine Drug Screen:  Recent Labs  Lab 06/15/20 2037  LABOPIA NONE DETECTED  COCAINSCRNUR NONE DETECTED  LABBENZ NONE DETECTED  AMPHETMU NONE DETECTED  THCU NONE DETECTED  LABBARB NONE DETECTED    Alcohol Level No results for input(s): ETH in the last 168 hours.  IMAGING past 24 hours ECHOCARDIOGRAM  COMPLETE  Result Date: 06/17/2020 IMPRESSIONS   1. Left ventricular ejection fraction, by estimation, is 70 to 75%. The left ventricle has hyperdynamic function. The left ventricle has no regional wall motion abnormalities. Left ventricular diastolic parameters are consistent with Grade I diastolic dysfunction (impaired relaxation).   2. Right ventricular systolic function is normal. The right ventricular size is normal. Tricuspid regurgitation signal is inadequate for assessing PA pressure.   3. The mitral valve is  normal in structure. Trivial mitral valve regurgitation. No evidence of mitral stenosis.   4. The aortic valve is tricuspid. Aortic valve regurgitation is not visualized. Mild aortic valve sclerosis is present, with no evidence of aortic valve stenosis.   5. The inferior vena cava is normal in size with greater than 50% respiratory variability, suggesting right atrial pressure of 3 mmHg.    IMAGING past 24 hours MR BRAIN W WO CONTRAST  Result Date: 06/15/2020 CLINICAL DATA:   IMPRESSION: 1. Innumerable foci of hemosiderin deposition scattered throughout the brain consistent with previous hemorrhagic infarctions. Intraparenchymal hematoma at the right frontoparietal junction with internal clot retraction, measuring 7.8 x 4.7 x 4.6 cm. No abnormal enhancement to suggest that this represents a hemorrhagic mass. Small hemorrhage in the left temporal lobe cannot be specifically identified by MRI as different than the other foci of hemosiderin deposition. 2. Small amount of subarachnoid hemorrhage within the sulci. 3. Mild mass-effect upon the right lateral ventricle with right-to-left shift 3 mm. Chronic small-vessel ischemic changes of the white matter elsewhere. The overall pattern could either be due to amyloid angiopathy or hypertensive vascular disease. 4. No finding to suggest the presence metastatic disease. Electronically Signed   By: Paulina Fusi M.D.   On: 06/15/2020 22:56   DG  CHEST PORT 1 VIEW  Result Date: 06/15/2020 CLINICAL DATA:  Unresponsive. EXAM: PORTABLE CHEST 1 VIEW COMPARISON:  None. FINDINGS: The heart size and mediastinal contours are within normal limits. Both lungs are clear. The visualized skeletal structures are unremarkable. IMPRESSION: No active disease. Electronically Signed   By: Lupita Raider M.D.   On: 06/15/2020 20:29   CT HEAD CODE STROKE WO CONTRAST  Result Date: 06/15/2020 IMPRESSION:  1. Right frontoparietal parenchymal hemorrhage measuring 7.6 cm. Sub-5 mm left temporal hemorrhage.  2. No significant midline shift or ventricular entrapment.  3. Right greater than left cerebral convexity subarachnoid blood products.  4. ASPECTS is 10.   PHYSICAL EXAM Physical Exam Constitutional: Elderly Caucasian male  Psych: UTA:  Intubated and sedated  Eyes: No scleral injection HENT: No oropharyngeal obstruction, c-collar in place.  MSK: no joint deformities.  Cardiovascular: Normal rateto slight tachycardiaand regular rhythm.  Respiratory:Rhonchorous breath sounds GI: Soft. No distension. There is no tenderness.  Skin: Warm dry and intact visible skin  Neuro: Mental Status: Intubated and sedated Patient isdrowsy does not open eyes to voice, not following commands  Cranial Nerves: II: Visual Fields exam shows diminished blink to the left suggestive of a left hemianopia. Eye position is  Midline.Pupils are equal, round, and reactive to light.3 to 2 mm bilaterally III,IV, KQ:ASUOR gaze preference, can track to midline V: Facial sensation isslightly reduced to eyelash brush on the left VII: Facial movement isnotable for a left facial droop.  VIII: hearing is intact to voice X: Uvuladifficult to visualize XI: unable to test due to cooperation XII: tongue is midline without atrophy or fasciculations.  Motor: Tone isincreased in the left upper extremity and lower extremity. Bulk is normal.He moves the right side freely  antigravity, at least 4/5. On the left upper weak WD, left lower triple flexion Sensory: Sensation appears reduced in the left arm and leg Deep Tendon Reflexes: 2+ and symmetric in the biceps and patellae. Plantars: Toes are upgoing on the left, down on the right   ASSESSMENT/PLAN  86 year old male with very minimal past medical history. Ate dinner with his wife at 1730 and having a conversationtill 1800 hrs. Abruptly at 1815 was found to  be down. EMS on arrival found his blood pressure to be high systolic 200s with slurred speech and left sided weakness. He had an episode of emesis on route and was transiently hypoxemic.  Multifocal intracerebral hemorrhage, largest in the right MCA territory with associated right MCA syndrome, cerebral edema and brain compression .  Head CT with a large(greater than 30 cc, 7.6 x 4.7largest axial dimensions)intraparenchymal hemorrhage of the right insula with surrounding edema and mass-effect on the lateral ventricle without intraventricular extension. There is also a small left temporal punctate hyperdensity concerning for hemorrhage and subarachnoid blood bilaterally, right greater than left   CTA head & neck  Stable appearance of 7.7 cm right frontoparietal hemorrhage. 2-3 mm leftward midline shift, unchanged. Sub 5 mm left temporal hemorrhage and bilateral subarachnoid blood products, unchanged.   CTA neck: No high-grade narrowing or large vessel occlusion within the neck. Less than 50% narrowing of the left CCA and bilateral proximal ICAs.   CTA head: No large vessel occlusion. High-grade distal right V4 segment narrowing. Mild to moderate right M2, right ICA terminus, distal basilar artery and bilateral PCA narrowing.   Cervical spine CT: No acute fracture or traumatic listhesis. Multilevel spondylosis most prominent at the C3-6 levels.   MRI  Innumerable foci of hemosiderin deposition scattered throughout the brain  consistent with previous hemorrhagic infarctions.Intraparenchymal hematoma at the right frontoparietal junction with internal clot retraction, measuring 7.8 x 4.7 x 4.6 cm. No abnormal enhancement to suggest that this represents a hemorrhagic mass. Small hemorrhage in the left temporal lobe cannot be specifically identified by MRI as different than the other foci of hemosiderin deposition.Small amount of subarachnoid hemorrhage within the sulci. Mild mass-effect upon the right lateral ventricle with right-to-left shift 3 mm. Chronic small-vessel ischemic changes of the white matter elsewhere. The overall pattern could either be due to amyloid angiopathy or hypertensive vascular disease.   2D Echo: EF 70-75%, Grade I diastolic dysfunction  LDL 103  HgbA1c 5.6  VTE prophylaxis - Start Lovenox 40mg  daily  Recommended NPO per SLP.   Hold Anticoagulant/Antiplatelet medications in setting of IPH, SAH  No AC/AP prior to admission  CT head: Slight enlargement in the parenchymal hemorrhage in the right frontal parietal region as described. Minimal midline shift from  right to left  Therapy recommendations:  CIR  Disposition:  TBD  Cerebral Edema  Exam worsening with increase somnolence  Hypertonic saline on hold with Na at goal 153  Sodium goal 150-155  Na 155 this morning   Check sodium level q6hrs  Hypertension  Home meds: none, no hx of HTN but with BP 200s             on EMS arrival  SBP goal <160   Currently on Norvasc 10mg  daily, coreg 25mg  bid  Weaned off cleviprex infusion currently  Labetalol 20mg  q2hr prn, hydralazine 20mg  q6hr        prn  Long-term BP goal normotensive  Hyperlipidemia  Home meds:  None   LDL 103, goal < 70  high intensity statin at discharge  Continue statin at discharge  Other Stroke Risk Factors  Advanced Age >/= 50   Overweight, Body mass index is 28.29 kg/m., recommend weight loss, diet and exercise as appropriate    Dysphagia  Secondary to stroke  SLP following  Cortrak in place  Tube feeds  Other Problems: Concern for C Spine injury  CCM cleared cervical collar   CT cervical spine: No acute fracture or traumatic listhesis.  No acute fracture or traumatic listhesis.  ? NSTEMI/At risk for MI Cardiac monitoring  Troponin 30  Aspiration Pneumonia  Vomited in route w/EMS  Febrile: Tmax 37.4  Leukocytosis: WBC 19.7 -> 21.2 ->12.2->11   Xray: bibasilar opacities  On Ceftriaxone 1g daily for possible aspiration pneumonia  Blood cultures (2/26): no growth final Sputum culture (2/28): Negative  Hospital day # 7  Patient appears to have developed resp failure due to aspiration pneumonia.Continue venntilatory support and antibiotics as per PCCM team..  Continue strict blood pressure control with systolic goal below 160 and serum sodium goal 150-155 presently hypertonic saline on hold.     Discussed with critical care team.  Nurse practitioner.  This patient is critically ill and at significant risk of neurological worsening, death and care requires constant monitoring of vital signs, hemodynamics,respiratory and cardiac monitoring, extensive review of multiple databases, frequent neurological assessment, discussion with family, other specialists and medical decision making of high complexity.I have made any additions or clarifications directly to the above note.This critical care time does not reflect procedure time, or teaching time or supervisory time of PA/NP/Med Resident etc but could involve care discussion time.  I spent 32 minutes of neurocritical care time  in the care of  this patient.     Delia HeadyPramod Caprice Wasko, MD  06/22/2020 7:54 AM   To contact Stroke Continuity provider, please refer to [email or web address]. After hours, contact General Neurology

## 2020-06-22 NOTE — Progress Notes (Signed)
PT Cancellation Note  Patient Details Name: Calvin Yates MRN: [id number] DOB: 12-16-1934   Cancelled Treatment:    Reason Eval/Treat Not Completed: Patient's level of consciousness;Patient not medically ready. RN requests hold on PT this morning due to pt being intubated overnight, currently restless and fighting it, and low level of consciousness. Will follow-up as able.   Raymond Gurney, PT, DPT Acute Rehabilitation Services  Pager: [phone] Office: [phone]    Jewel Baize 06/22/2020, 8:45 AM

## 2020-06-23 DIAGNOSIS — R509 Fever, unspecified: Secondary | ICD-10-CM | POA: Diagnosis not present

## 2020-06-23 DIAGNOSIS — J9601 Acute respiratory failure with hypoxia: Secondary | ICD-10-CM | POA: Diagnosis not present

## 2020-06-23 DIAGNOSIS — R069 Unspecified abnormalities of breathing: Secondary | ICD-10-CM | POA: Diagnosis not present

## 2020-06-23 DIAGNOSIS — T17908A Unspecified foreign body in respiratory tract, part unspecified causing other injury, initial encounter: Secondary | ICD-10-CM

## 2020-06-23 DIAGNOSIS — Z9911 Dependence on respirator [ventilator] status: Secondary | ICD-10-CM

## 2020-06-23 DIAGNOSIS — R739 Hyperglycemia, unspecified: Secondary | ICD-10-CM

## 2020-06-23 DIAGNOSIS — I611 Nontraumatic intracerebral hemorrhage in hemisphere, cortical: Secondary | ICD-10-CM | POA: Diagnosis not present

## 2020-06-23 DIAGNOSIS — G936 Cerebral edema: Secondary | ICD-10-CM

## 2020-06-23 LAB — BASIC METABOLIC PANEL
Anion gap: 8 (ref 5–15)
BUN: 37 mg/dL — ABNORMAL HIGH (ref 8–23)
CO2: 23 mmol/L (ref 22–32)
Calcium: 8.2 mg/dL — ABNORMAL LOW (ref 8.9–10.3)
Chloride: 123 mmol/L — ABNORMAL HIGH (ref 98–111)
Creatinine, Ser: 0.63 mg/dL (ref 0.61–1.24)
GFR, Estimated: >60 mL/min (ref >60)
Glucose, Bld: 138 mg/dL — ABNORMAL HIGH (ref 70–99)
Potassium: 4.1 mmol/L (ref 3.5–5.1)
Sodium: 154 mmol/L — ABNORMAL HIGH (ref 135–145)

## 2020-06-23 LAB — GLUCOSE, CAPILLARY
Glucose-Capillary: 125 mg/dL — ABNORMAL HIGH (ref 70–99)
Glucose-Capillary: 127 mg/dL — ABNORMAL HIGH (ref 70–99)
Glucose-Capillary: 131 mg/dL — ABNORMAL HIGH (ref 70–99)
Glucose-Capillary: 133 mg/dL — ABNORMAL HIGH (ref 70–99)
Glucose-Capillary: 151 mg/dL — ABNORMAL HIGH (ref 70–99)
Glucose-Capillary: 153 mg/dL — ABNORMAL HIGH (ref 70–99)

## 2020-06-23 LAB — CBC
HCT: 35.4 % — ABNORMAL LOW (ref 39.0–52.0)
Hemoglobin: 10.8 g/dL — ABNORMAL LOW (ref 13.0–17.0)
MCH: 32 pg (ref 26.0–34.0)
MCHC: 30.5 g/dL (ref 30.0–36.0)
MCV: 104.7 fL — ABNORMAL HIGH (ref 80.0–100.0)
Platelets: 180 10*3/uL (ref 150–400)
RBC: 3.38 MIL/uL — ABNORMAL LOW (ref 4.22–5.81)
RDW: 13.4 % (ref 11.5–15.5)
WBC: 11.4 10*3/uL — ABNORMAL HIGH (ref 4.0–10.5)
nRBC: 0 % (ref 0.0–0.2)

## 2020-06-23 LAB — TRIGLYCERIDES: Triglycerides: 118 mg/dL (ref <150)

## 2020-06-23 LAB — SODIUM: Sodium: 154 mmol/L — ABNORMAL HIGH (ref 135–145)

## 2020-06-23 NOTE — Progress Notes (Signed)
STROKE TEAM PROGRESS NOTE   INTERVAL HISTORY Wife and daughter are at the bedside. Pt intubated but following simple commands on the right. Has been off 3% saline. Still has left hemiplegia.  Vitals:   06/23/20 0827 06/23/20 0900 06/23/20 1000 06/23/20 1010  BP: (!) 121/49 (!) 123/49 (!) 126/46   Pulse: 63 65 67 (!) 59  Resp: (!) 25 (!) 25 (!) 21   Temp:      TempSrc:      SpO2: 98% 98% 98%   Weight:      Height:       CBC:  Recent Labs  Lab 06/19/20 0446 06/20/20 0433 06/23/20 0506  WBC 12.2* 11.0* 11.4*  NEUTROABS 8.8* 8.1*  --   HGB 11.5* 11.7* 10.8*  HCT 32.9* 35.3* 35.4*  MCV 94.8 99.4 104.7*  PLT 175 186 180   Basic Metabolic Panel:  Recent Labs  Lab 06/19/20 0446 06/19/20 1002 06/20/20 0433 06/20/20 1035 06/21/20 0422 06/21/20 0952 06/22/20 0352 06/22/20 1117 06/23/20 0506 06/23/20 1122  NA 139   < > 152*   < > 155*   < > 153*   < > 154* 154*  K 3.9  --  4.3  --   --   --  4.2  --  4.1  --   CL 106  --  121*  --   --   --  124*  --  123*  --   CO2 24  --  24  --   --   --  21*  --  23  --   GLUCOSE 136*  --  155*  --   --   --  160*  --  138*  --   BUN 35*  --  25*  --   --   --  39*  --  37*  --   CREATININE 0.97  --  0.72  --   --   --  0.97  --  0.63  --   CALCIUM 8.1*  --  8.1*  --   --   --  8.3*  --  8.2*  --   MG 2.5*  --  2.6*  --   --   --   --   --   --   --   PHOS 2.3*  --  2.2*  --  3.2  --   --   --   --   --    < > = values in this interval not displayed.   Lipid Panel:  Recent Labs  Lab 06/23/20 0506  TRIG 118   HgbA1c:  No results for input(s): HGBA1C in the last 168 hours. Urine Drug Screen:  No results for input(s): LABOPIA, COCAINSCRNUR, LABBENZ, AMPHETMU, THCU, LABBARB in the last 168 hours.  Alcohol Level No results for input(s): ETH in the last 168 hours.  IMAGING past 24 hours ECHOCARDIOGRAM COMPLETE  Result Date: 06/17/2020 IMPRESSIONS   1. Left ventricular ejection fraction, by estimation, is 70 to 75%. The left  ventricle has hyperdynamic function. The left ventricle has no regional wall motion abnormalities. Left ventricular diastolic parameters are consistent with Grade I diastolic dysfunction (impaired relaxation).   2. Right ventricular systolic function is normal. The right ventricular size is normal. Tricuspid regurgitation signal is inadequate for assessing PA pressure.   3. The mitral valve is normal in structure. Trivial mitral valve regurgitation. No evidence of mitral stenosis.   4. The aortic valve is tricuspid. Aortic valve  regurgitation is not visualized. Mild aortic valve sclerosis is present, with no evidence of aortic valve stenosis.   5. The inferior vena cava is normal in size with greater than 50% respiratory variability, suggesting right atrial pressure of 3 mmHg.    IMAGING past 24 hours MR BRAIN W WO CONTRAST  Result Date: 06/15/2020 CLINICAL DATA:   IMPRESSION: 1. Innumerable foci of hemosiderin deposition scattered throughout the brain consistent with previous hemorrhagic infarctions. Intraparenchymal hematoma at the right frontoparietal junction with internal clot retraction, measuring 7.8 x 4.7 x 4.6 cm. No abnormal enhancement to suggest that this represents a hemorrhagic mass. Small hemorrhage in the left temporal lobe cannot be specifically identified by MRI as different than the other foci of hemosiderin deposition. 2. Small amount of subarachnoid hemorrhage within the sulci. 3. Mild mass-effect upon the right lateral ventricle with right-to-left shift 3 mm. Chronic small-vessel ischemic changes of the white matter elsewhere. The overall pattern could either be due to amyloid angiopathy or hypertensive vascular disease. 4. No finding to suggest the presence metastatic disease. Electronically Signed   By: Paulina Fusi M.D.   On: 06/15/2020 22:56   DG CHEST PORT 1 VIEW  Result Date: 06/15/2020 CLINICAL DATA:  Unresponsive. EXAM: PORTABLE CHEST 1 VIEW COMPARISON:  None.  FINDINGS: The heart size and mediastinal contours are within normal limits. Both lungs are clear. The visualized skeletal structures are unremarkable. IMPRESSION: No active disease. Electronically Signed   By: Lupita Raider M.D.   On: 06/15/2020 20:29   CT HEAD CODE STROKE WO CONTRAST  Result Date: 06/15/2020 IMPRESSION:  1. Right frontoparietal parenchymal hemorrhage measuring 7.6 cm. Sub-5 mm left temporal hemorrhage.  2. No significant midline shift or ventricular entrapment.  3. Right greater than left cerebral convexity subarachnoid blood products.  4. ASPECTS is 10.   PHYSICAL EXAM  Temp:  [96.8 F (36 C)-97.9 F (36.6 C)] 97.5 F (36.4 C) (03/05 0400) Pulse Rate:  [56-68] 59 (03/05 1010) Resp:  [17-25] 21 (03/05 1000) BP: (99-126)/(44-55) 126/46 (03/05 1000) SpO2:  [95 %-100 %] 98 % (03/05 1000) FiO2 (%):  [40 %] 40 % (03/05 0827) Weight:  [84.1 kg] 84.1 kg (03/05 0500)  General - Well nourished, well developed, intubated on precedex.  Ophthalmologic - fundi not visualized due to noncooperation.  Cardiovascular - Regular rate and rhythm.  Neuro - intubated on precedex, eyes open, able to follow midline and peripheral commands on the right. With eye opening, eyes in right gaze position, not blinking to visual threat on the left and inconsistently blinking to visual threat on the right, not tracking, PERRL. Corneal reflex present, gag and cough present. Breathing over the vent.  Facial symmetry not able to test due to ET tube.  Tongue protrusion not cooperative. Right UE 4-/5 with minimal drift. RLE 2+/5 knee flexion and 3/5 ankle toe DF/PF. On pain stimulation, no movement of LUE and LLE. Positive left babinski. Sensation, coordination not cooperative and gait not tested.    ASSESSMENT/PLAN  86 year old male with very minimal past medical history. Ate dinner with his wife at 1730 and having a conversationtill 1800 hrs. Abruptly at 1815 was found to be down. EMS on  arrival found his blood pressure to be high systolic 200s with slurred speech and left sided weakness. He had an episode of emesis on route and was transiently hypoxemic.  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   CTA head & neck stable ICH, Mild to moderate right M2, right ICA terminus, distal basilar artery and bilateral PCA narrowing.  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 The overall pattern could either be due to amyloid angiopathy or hypertensive vascular disease.  No AC/AP  Long term BP goal < 140   Hypertension  Home meds: none, no hx of HTN but with BP 200s on EMS arrival  Currently on Norvasc 10mg  daily, coreg 25mg  bid  Weaned off cleviprex infusion currently  Labetalol 20mg  q2hr prn, hydralazine 20mg  q6hr prn  Long-term BP goal < 140 due to CAA  Hyperlipidemia  Home meds:  None   LDL 103, goal < 70  No statin at this time due to ICH and concern of CAA  Dysphagia  Secondary to stroke  SLP following  Cortrak in place  On Tube feeds  Aspiration Pneumonia  Vomited in route w/EMS  Febrile: Tmax 101.2  Leukocytosis: WBC 19.7 -> 21.2 ->12.2->11.4  CXR bibasilar  opacities  On Ceftriaxone 1g daily for possible aspiration pneumonia -> off now  Blood cultures (2/26): no growth final  Sputum culture (2/28): Negative  Respiratory failure  Likely related to aspiration  Intubated   On weaning  CCM on board  Other Stroke Risk Factors  Advanced Age >/= 30    Hospital day # 8  This patient is critically ill due to large right ICH and mass effect, CAA, respiratory failure, aspiration and at significant risk of neurological worsening, death form recurrent hemorrhage, brain herniation, seizure, sepsis. This patient's care requires constant monitoring of vital signs, hemodynamics, respiratory and cardiac monitoring, review of multiple databases, neurological assessment, discussion with family, other specialists and medical decision making of high complexity. I spent 40 minutes of neurocritical care time in the care of this patient. I had long discussion with wife and daughter at bedside, updated pt current condition, treatment plan and potential prognosis, and answered all the questions. They expressed understanding and appreciation.   , MD PhD Stroke Neurology 06/24/2020 12:55 AM     To contact Stroke Continuity provider, please refer to 12-07-1981. After hours, contact General Neurology

## 2020-06-23 NOTE — Progress Notes (Signed)
NAME:  Calvin Yates, MRN:  [id number], DOB:  [birth year], LOS: 8 ADMISSION DATE:  06/15/2020, CONSULTATION DATE:  06/15/20 REFERRING MD:  Dr Iver Nestle - neuro, CHIEF COMPLAINT:   - Acute encephalopathy due to ICH  Brief History:  Acute encephalopathy due to ICH  History of Present Illness:  86 year old male with very minimal past medical history.  Ate dinner with his wife at 1730 and having a conversation till 1800 hrs.  Abruptly at 1815 was found to be down.  EMS on arrival found his blood pressure to be high systolic 200s.  He had an episode of emesis on route and transiently hypoxemic.  But in the ER for the 90 minutes prior to CCM evaluation saturation 95% on room air and no further emesis.  He is drowsy but following commands with hemiparesis on the left side.  7 cm right intracranial hemorrhage frontoparietal region hemorrhage without midline shift.  Patient's not intubated.  He has a hard collar on because of his fall.  C-spine CT is pending.  CCM called because of high risk of worsening encephalopathy and respiratory failure  Significant Hospital Events:  06/15/2020 - admit  Consults:  06/15/2020  - ccm   Procedures:  ETT; reintubated 3/3  Significant Diagnostic Tests:   CT HEAD 2/25 > Right frontoparietal parenchymal hemorrhage measuring 7.6 cm. Sub-5 mm left temporal hemorrhage. No significant midline shift or ventricular entrapment. Right greater than left cerebral convexity subarachnoid blood products.  CXR 06/18/2020       Interval placement of feeding tube that extends       below the diaphragm into the stomach. New bibasilar opacities may be       consistent with atelectasis but could also be consistent with       aspiration pneumonia given clinical history. No overt edema, pleural       fluid or pneumothorax.  CT Head wo contrast 06/19/2020 Slight enlargement in the parenchymal hemorrhage in the right frontal parietal region as described. Minimal midline shift from right to  left is noted. Stable small parenchymal hemorrhage in the left temporal lobe unchanged from the prior exam.   Micro Data:  RVP panel 2/25 > negative  MRSA PCR 2/25 > negative  Respiratory Culture 2/28 >> Normal flora  Blood culture 2/28 > negative   Antimicrobials:  Rocephin 06/18/2020 >>  Interim History / Subjective:  This morning waking up as precedex decreases.  Objective   Blood pressure (!) 120/48, pulse 63, temperature (!) 97.5 F (36.4 C), temperature source Oral, resp. rate (!) 23, height 5\' 8"  (1.727 m), weight 84.1 kg, SpO2 96 %.    Vent Mode: PRVC FiO2 (%):  [40 %] 40 % Set Rate:  [16 bmp] 16 bmp Vt Set:  [550 mL] 550 mL PEEP:  [5 cmH20] 5 cmH20 Plateau Pressure:  [14 cmH20-16 cmH20] 15 cmH20   Intake/Output Summary (Last 24 hours) at 06/23/2020 0830 Last data filed at 06/23/2020 0700 Gross per 24 hour  Intake 2593.28 ml  Output 800 ml  Net 1793.28 ml   Filed Weights   06/21/20 0500 06/22/20 0500 06/23/20 0500  Weight: 76.8 kg 78.4 kg 84.1 kg    Physical exam General: critically ill appearing man laying in bed intubated HEENT: [place]/AT, eyes anicteric, ETT in place. Neuro: Awake, tracking on the right, neglect on the left. Moved R toes on command most briskly, moves R hand after a few seconds, not lifting head off the pillow or sticking out tongue on command.  CV: S1S2, RRR PULM:  CTAB, no significant ETT secretions. Tolerating SBT 5/5 GI: soft, NT, ND Extremities: no c/c/e Skin: no rashes or wounds  Resolved Hospital Problem list   Concern for C-spine injury Hypophosphatemia  Assessment & Plan:   Large intracranial hemorrhage and associated acute encephalopathy, left neglect  Worsened mental status requiring reintubation on 3/4 overnight -Slight enlargement in the parenchymal hemorrhage with minimal R>L shify noted 3/1 repeat CT head High risk for acute respiratory failure requiring intubation, concern for aspiration P: -Appreciate Neuro's management. Na+  remains at goal 150-155 -HTN management, goal SBP <160 -neuroprotective measures, HOB >30 degrees, normoglycemia, normal electrolytes -enteral nutrition   -Seizure precautions  -will need aggressive PT, OT, SLP  Acute hypoxic respiratory failure  -Required urgent intubation overnight 3/4 Aspiration pneumonia  -Per report, vomited in route to the hospital -Respiratory culture with normal respiratory flora  P: -Con't LTVV, 4-8cc/kg IBW with goal Pplat <30 and DP <15 -VAP prevention protocol -PAD protocol for sedation -daily SAT & SBT; mental status has been primary barrier to extubation. Weaning precedex. -ceftriaxone x 5 days completed; repeat CBC tomorrow and con't to monitor off antibiotics  Hypertensive emergency associated with intracranial hemorrhage -B/P per EMS was systolicly in the 200s -Echo with EF 70 to 75% and grade 1 diastolic dysfunction P: -con't amlodipine and coreg  -hydralazine PRN  Hypergylcemia- controlled, not requiring insulin -SSI PRN -goal BG 140-180  Acute anemia likely due to acute illness -transfuse for Gb <7 or hemodynamically significant bleeding -con't to monitor  Best practice (evaluated daily)  Diet: Tube feeds Pain/Anxiety/Delirium protocol (if indicated): PAD protocol VAP protocol (if indicated):n/a DVT prophylaxis: Lovenox GI prophylaxis: PPI Glucose control: SSI Mobility: Bedrest Disposition: Neuro ICU  Goals of Care:   Family Updates: Per primary   LABS    PULMONARY Recent Labs  Lab 06/21/20 1742 06/22/20 0150  PHART  --  7.439  PCO2ART  --  32.4  PO2ART  --  119*  HCO3 23.8 21.7  O2SAT 68.1 98.4    CBC Recent Labs  Lab 06/19/20 0446 06/20/20 0433 06/23/20 0506  HGB 11.5* 11.7* 10.8*  HCT 32.9* 35.3* 35.4*  WBC 12.2* 11.0* 11.4*  PLT 175 186 180    COAGULATION No results for input(s): INR in the last 168 hours.  CARDIAC  No results for input(s): TROPONINI in the last 168 hours. No results for  input(s): PROBNP in the last 168 hours.   CHEMISTRY Recent Labs  Lab 06/17/20 0526 06/18/20 0257 06/18/20 1616 06/19/20 0446 06/19/20 1002 06/20/20 0433 06/20/20 1035 06/21/20 0422 06/21/20 6301 06/22/20 0352 06/22/20 1117 06/22/20 1552 06/22/20 2115 06/23/20 0506  NA 135 135  --  139   < > 152*   < > 155*   < > 153* 153* 153* 153* 154*  K 4.0 4.6  --  3.9  --  4.3  --   --   --  4.2  --   --   --  4.1  CL 102 104  --  106  --  121*  --   --   --  124*  --   --   --  123*  CO2 22 18*  --  24  --  24  --   --   --  21*  --   --   --  23  GLUCOSE 145* 141*  --  136*  --  155*  --   --   --  160*  --   --   --  138*  BUN 24* 29*  --  35*  --  25*  --   --   --  39*  --   --   --  37*  CREATININE 1.23 1.17  --  0.97  --  0.72  --   --   --  0.97  --   --   --  0.63  CALCIUM 8.5* 8.5*  --  8.1*  --  8.1*  --   --   --  8.3*  --   --   --  8.2*  MG  --   --  2.6* 2.5*  --  2.6*  --   --   --   --   --   --   --   --   PHOS 4.1 3.3  --  2.3*  --  2.2*  --  3.2  --   --   --   --   --   --    < > = values in this interval not displayed.   Estimated Creatinine Clearance: 72.6 mL/min (by C-G formula based on SCr of 0.63 mg/dL).   LIVER No results for input(s): AST, ALT, ALKPHOS, BILITOT, PROT, ALBUMIN, INR in the last 168 hours.   INFECTIOUS Recent Labs  Lab 06/17/20 0526 06/18/20 0257  PROCALCITON <0.10 0.13     ENDOCRINE CBG (last 3)  Recent Labs    06/22/20 2329 06/23/20 0328 06/23/20 0817  GLUCAP 143* 133* 153*     This patient is critically ill with multiple organ system failure which requires frequent high complexity decision making, assessment, support, evaluation, and titration of therapies. This was completed through the application of advanced monitoring technologies and extensive interpretation of multiple databases. During this encounter critical care time was devoted to patient care services described in this note for 35 minutes.  Steffanie Dunn, DO  06/23/20 8:50 AM [facility]  From 7AM- 7PM if no response to pager, please call [phone]. After hours, 7PM- 7AM, please call Elink  [phone].

## 2020-06-24 ENCOUNTER — Inpatient Hospital Stay (HOSPITAL_COMMUNITY): Payer: Medicare Other

## 2020-06-24 DIAGNOSIS — T17908A Unspecified foreign body in respiratory tract, part unspecified causing other injury, initial encounter: Secondary | ICD-10-CM | POA: Diagnosis not present

## 2020-06-24 DIAGNOSIS — Z9911 Dependence on respirator [ventilator] status: Secondary | ICD-10-CM | POA: Diagnosis not present

## 2020-06-24 DIAGNOSIS — R069 Unspecified abnormalities of breathing: Secondary | ICD-10-CM | POA: Diagnosis not present

## 2020-06-24 DIAGNOSIS — R509 Fever, unspecified: Secondary | ICD-10-CM

## 2020-06-24 DIAGNOSIS — J9601 Acute respiratory failure with hypoxia: Secondary | ICD-10-CM | POA: Diagnosis not present

## 2020-06-24 DIAGNOSIS — D72829 Elevated white blood cell count, unspecified: Secondary | ICD-10-CM

## 2020-06-24 DIAGNOSIS — I611 Nontraumatic intracerebral hemorrhage in hemisphere, cortical: Secondary | ICD-10-CM | POA: Diagnosis not present

## 2020-06-24 DIAGNOSIS — G9340 Encephalopathy, unspecified: Secondary | ICD-10-CM | POA: Diagnosis not present

## 2020-06-24 LAB — CBC
HCT: 36.2 % — ABNORMAL LOW (ref 39.0–52.0)
Hemoglobin: 11.1 g/dL — ABNORMAL LOW (ref 13.0–17.0)
MCH: 32 pg (ref 26.0–34.0)
MCHC: 30.7 g/dL (ref 30.0–36.0)
MCV: 104.3 fL — ABNORMAL HIGH (ref 80.0–100.0)
Platelets: 186 10*3/uL (ref 150–400)
RBC: 3.47 MIL/uL — ABNORMAL LOW (ref 4.22–5.81)
RDW: 13.5 % (ref 11.5–15.5)
WBC: 13.8 10*3/uL — ABNORMAL HIGH (ref 4.0–10.5)
nRBC: 0 % (ref 0.0–0.2)

## 2020-06-24 LAB — BASIC METABOLIC PANEL
Anion gap: 6 (ref 5–15)
BUN: 36 mg/dL — ABNORMAL HIGH (ref 8–23)
CO2: 25 mmol/L (ref 22–32)
Calcium: 8 mg/dL — ABNORMAL LOW (ref 8.9–10.3)
Chloride: 124 mmol/L — ABNORMAL HIGH (ref 98–111)
Creatinine, Ser: 1.08 mg/dL (ref 0.61–1.24)
GFR, Estimated: >60 mL/min (ref >60)
Glucose, Bld: 139 mg/dL — ABNORMAL HIGH (ref 70–99)
Potassium: 4.3 mmol/L (ref 3.5–5.1)
Sodium: 155 mmol/L — ABNORMAL HIGH (ref 135–145)

## 2020-06-24 LAB — GLUCOSE, CAPILLARY
Glucose-Capillary: 135 mg/dL — ABNORMAL HIGH (ref 70–99)
Glucose-Capillary: 131 mg/dL — ABNORMAL HIGH (ref 70–99)
Glucose-Capillary: 143 mg/dL — ABNORMAL HIGH (ref 70–99)
Glucose-Capillary: 120 mg/dL — ABNORMAL HIGH (ref 70–99)
Glucose-Capillary: 135 mg/dL — ABNORMAL HIGH (ref 70–99)
Glucose-Capillary: 129 mg/dL — ABNORMAL HIGH (ref 70–99)

## 2020-06-24 MED ORDER — PIPERACILLIN-TAZOBACTAM 3.375 G IVPB
3.3750 g | Freq: Three times a day (TID) | INTRAVENOUS | Status: DC
  Administered 2020-06-24 – 2020-06-29 (×15): 3.375 g via INTRAVENOUS
  Filled 2020-06-24 (×15): qty 50

## 2020-06-24 MED ORDER — FREE WATER
200.0000 mL | Freq: Four times a day (QID) | Status: DC
  Administered 2020-06-24 – 2020-06-25 (×4): 200 mL

## 2020-06-24 MED ORDER — PIPERACILLIN-TAZOBACTAM 3.375 G IVPB 30 MIN
3.3750 g | Freq: Once | INTRAVENOUS | Status: AC
  Administered 2020-06-24: 3.375 g via INTRAVENOUS
  Filled 2020-06-24 (×2): qty 50

## 2020-06-24 NOTE — Progress Notes (Addendum)
Pharmacy Antibiotic Note  Calvin Yates is a 86 y.o. male admitted on 06/15/2020 with sepsis.  Pharmacy has been consulted for Zosyn dosing. SCr up to 1.08.  Noted "rash" allergy reported to amoxicillin. Per discussion with Dr. Chestine Spore, ok to trial Zosyn and monitor. She is concerned about potential for neurotoxicity with cefepime.  Plan: Zosyn 3.375g IV ( infusion) x1; then 3.375g IV q8h (4h infusion) Monitor clinical progress, c/s, renal function F/u de-escalation plan/LOT   Height: 5\' 8"  (172.7 cm) Weight: 81.2 kg (179 lb 0.2 oz) IBW/kg (Calculated) : 68.4  Temp (24hrs), Avg:99.6 F (37.6 C), Min:98.5 F (36.9 C), Max:101.2 F (38.4 C)  Recent Labs  Lab 06/18/20 0257 06/19/20 0446 06/20/20 0433 06/22/20 0352 06/23/20 0506 06/24/20 0146  WBC 21.2* 12.2* 11.0*  --  11.4* 13.8*  CREATININE 1.17 0.97 0.72 0.97 0.63 1.08    Estimated Creatinine Clearance: 49.3 mL/min (by C-G formula based on SCr of 1.08 mg/dL).    Allergies  Allergen Reactions  . Amoxicillin Rash     08/24/20, PharmD, BCPS Please check AMION for all [facility] contact numbers Clinical Pharmacist 06/24/2020 7:41 AM

## 2020-06-24 NOTE — Progress Notes (Signed)
NAME:  Qais Jowers, MRN:  [id number], DOB:  October 21, 1934, LOS: 9 ADMISSION DATE:  06/15/2020, CONSULTATION DATE:  06/15/20 REFERRING MD:  Dr Iver Nestle - neuro, CHIEF COMPLAINT:   - Acute encephalopathy due to ICH  Brief History:  Acute encephalopathy due to ICH  History of Present Illness:  86 year old male with very minimal past medical history.  Ate dinner with his wife at 1730 and having a conversation till 1800 hrs.  Abruptly at 1815 was found to be down.  EMS on arrival found his blood pressure to be high systolic 200s.  He had an episode of emesis on route and transiently hypoxemic.  But in the ER for the 90 minutes prior to CCM evaluation saturation 95% on room air and no further emesis.  He is drowsy but following commands with hemiparesis on the left side.  7 cm right intracranial hemorrhage frontoparietal region hemorrhage without midline shift.  Patient's not intubated.  He has a hard collar on because of his fall.  C-spine CT is pending.  CCM called because of high risk of worsening encephalopathy and respiratory failure  Significant Hospital Events:  06/15/2020 - admit  Consults:  06/15/2020  - ccm   Procedures:  ETT; reintubated 3/3  Significant Diagnostic Tests:   CT HEAD 2/25 > Right frontoparietal parenchymal hemorrhage measuring 7.6 cm. Sub-5 mm left temporal hemorrhage. No significant midline shift or ventricular entrapment. Right greater than left cerebral convexity subarachnoid blood products.  CXR 06/18/2020       Interval placement of feeding tube that extends       below the diaphragm into the stomach. New bibasilar opacities may be       consistent with atelectasis but could also be consistent with       aspiration pneumonia given clinical history. No overt edema, pleural       fluid or pneumothorax.  CT Head wo contrast 06/19/2020 Slight enlargement in the parenchymal hemorrhage in the right frontal parietal region as described. Minimal midline shift from right to  left is noted. Stable small parenchymal hemorrhage in the left temporal lobe unchanged from the prior exam.   Micro Data:  RVP panel 2/25 > negative  MRSA PCR 2/25 > negative  Respiratory Culture 2/28 >> Normal flora  Blood culture 2/28 > negative   Antimicrobials:  Rocephin 06/18/2020 >>  Interim History / Subjective:  Febrile yesterday. Today he denies complaints.  Objective   Blood pressure (!) 112/48, pulse 67, temperature 98.5 F (36.9 C), temperature source Axillary, resp. rate 19, height 5\' 8"  (1.727 m), weight 81.2 kg, SpO2 100 %.    Vent Mode: PRVC FiO2 (%):  [30 %-40 %] 40 % Set Rate:  [16 bmp] 16 bmp Vt Set:  [550 mL] 550 mL PEEP:  [5 cmH20] 5 cmH20 Pressure Support:  [5 cmH20] 5 cmH20 Plateau Pressure:  [11 cmH20] 11 cmH20   Intake/Output Summary (Last 24 hours) at 06/24/2020 0714 Last data filed at 06/24/2020 0600 Gross per 24 hour  Intake 439.34 ml  Output 1700 ml  Net -1260.66 ml   Filed Weights   06/22/20 0500 06/23/20 0500 06/24/20 0412  Weight: 78.4 kg 84.1 kg 81.2 kg    Physical exam General: ill appearing man laying in bed in NAD, intubated, not sedated HEENT: [place]/AT, eyes anicteric Neuro: Awake, nodding to answer y/n questions with slight delay. Moving RUE from shoulder and weak hand strength, but not able to lift arm off bed. Moving toes on the right and  moving leg minimally from his hip on the left. Not moving LUE. Left sided neglect. CV: S1S2, RRR PULM:  No significant ETT secretions, CTAB GI: soft, NT, ND GU: condom cath, clear yellow urine Extremities: no edema or cyanosis. R hand in mitt restraint. Skin: warm, dry, no rashes  Resolved Hospital Problem list   Concern for C-spine injury Hypophosphatemia  Assessment & Plan:   Large intracranial hemorrhage and associated acute encephalopathy, left neglect  Worsened mental status requiring reintubation on 3/4 overnight -Slight enlargement in the parenchymal hemorrhage with minimal R>L shify  noted 3/1 repeat CT head High risk for acute respiratory failure requiring intubation, concern for aspiration P: -Appreciate Neurology's input. Na+ at goal of 150-155. -HTN management, goal SBP <160-- meeting goal -Neuroprotective measures, HOB >30 degrees, normoglycemia, normal electrolytes -enteral nutrition   -Seizure precautions  -will need aggressive PT, OT, SLP  Acute hypoxic respiratory failure requiring MV -Required urgent intubation overnight 3/4 Aspiration pneumonia  -Per report, vomited in route to the hospital -Respiratory culture with normal respiratory flora  P: -Con't LTVV, 4-8cc/kg IBW with goal Pplat <30 and DP <15 -VAP prevention protocol -PAD protocol for sedation -Daily SAT & SBT; mental status has been primary barrier to extubation & remains marginal. Minimize sedation as much as possible.  Pyrexia, leukocytosis; no obvious source of infection -Completed 5 days of Ceftriaxone, but leukocytosis and fevers after stopping>> starting zosyn, blood cultures, CXR today  Hypertensive emergency associated with intracranial hemorrhage> now controlled -B/P per EMS was systolicly in the 200s -Echo with EF 70 to 75% and grade 1 diastolic dysfunction P: -con't amlodipine and coreg  -hydralazine PRN  Hypergylcemia- controlled, has not been requiring insulin. -SSI PRN -goal BG 140-180  Acute anemia, likely due to acute illness -transfuse for Gb <7 or hemodynamically significant bleeding -con't to monitor -monitor for signs of bleeding  Best practice (evaluated daily)  Diet: Tube feeds Pain/Anxiety/Delirium protocol (if indicated): PAD protocol VAP protocol (if indicated):n/a DVT prophylaxis: Lovenox GI prophylaxis: PPI Glucose control: SSI Mobility: Bedrest Disposition: Neuro ICU  Goals of Care:   Family Updates: Per primary   LABS    PULMONARY Recent Labs  Lab 06/21/20 1742 06/22/20 0150  PHART  --  7.439  PCO2ART  --  32.4  PO2ART  --  119*   HCO3 23.8 21.7  O2SAT 68.1 98.4    CBC Recent Labs  Lab 06/20/20 0433 06/23/20 0506 06/24/20 0146  HGB 11.7* 10.8* 11.1*  HCT 35.3* 35.4* 36.2*  WBC 11.0* 11.4* 13.8*  PLT 186 180 186    COAGULATION No results for input(s): INR in the last 168 hours.  CARDIAC  No results for input(s): TROPONINI in the last 168 hours. No results for input(s): PROBNP in the last 168 hours.   CHEMISTRY Recent Labs  Lab 06/18/20 0257 06/18/20 1616 06/19/20 0446 06/19/20 1002 06/20/20 0433 06/20/20 1035 06/21/20 0422 06/21/20 9147 06/22/20 0352 06/22/20 1117 06/22/20 1552 06/22/20 2115 06/23/20 0506 06/23/20 1122 06/24/20 0146  NA 135  --  139   < > 152*   < > 155*   < > 153*   < > 153* 153* 154* 154* 155*  K 4.6  --  3.9  --  4.3  --   --   --  4.2  --   --   --  4.1  --  4.3  CL 104  --  106  --  121*  --   --   --  124*  --   --   --  123*  --  124*  CO2 18*  --  24  --  24  --   --   --  21*  --   --   --  23  --  25  GLUCOSE 141*  --  136*  --  155*  --   --   --  160*  --   --   --  138*  --  139*  BUN 29*  --  35*  --  25*  --   --   --  39*  --   --   --  37*  --  36*  CREATININE 1.17  --  0.97  --  0.72  --   --   --  0.97  --   --   --  0.63  --  1.08  CALCIUM 8.5*  --  8.1*  --  8.1*  --   --   --  8.3*  --   --   --  8.2*  --  8.0*  MG  --  2.6* 2.5*  --  2.6*  --   --   --   --   --   --   --   --   --   --   PHOS 3.3  --  2.3*  --  2.2*  --  3.2  --   --   --   --   --   --   --   --    < > = values in this interval not displayed.   Estimated Creatinine Clearance: 49.3 mL/min (by C-G formula based on SCr of 1.08 mg/dL).   LIVER No results for input(s): AST, ALT, ALKPHOS, BILITOT, PROT, ALBUMIN, INR in the last 168 hours.   INFECTIOUS Recent Labs  Lab 06/18/20 0257  PROCALCITON 0.13     ENDOCRINE CBG (last 3)  Recent Labs    06/23/20 1929 06/23/20 2348 06/24/20 0405  GLUCAP 151* 125* 135*     This patient is critically ill with multiple organ  system failure which requires frequent high complexity decision making, assessment, support, evaluation, and titration of therapies. This was completed through the application of advanced monitoring technologies and extensive interpretation of multiple databases. During this encounter critical care time was devoted to patient care services described in this note for 33 minutes.  Steffanie Dunn, DO 06/24/20 7:45 AM [facility]  From 7AM- 7PM if no response to pager, please call [phone]. After hours, 7PM- 7AM, please call Elink  [phone].

## 2020-06-24 NOTE — Progress Notes (Signed)
STROKE TEAM PROGRESS NOTE   INTERVAL HISTORY RN is at the bedside. Pt still intubated on low dose precedex, eyes open on voice but a little more lethargic than yesterday. No fever today, still following simple commands on the right. Still has left hemiplegia. Na 155 today, on Zosyn. Tolerated weaning for 12h yesterday, will do again today, plan to extubate tomorrow.  Vitals:   06/24/20 0500 06/24/20 0600 06/24/20 0734 06/24/20 0800  BP: (!) 116/48 (!) 112/48 (!) 122/48   Pulse: 68 67 69   Resp: 19 19 19    Temp:    99.4 F (37.4 C)  TempSrc:    Oral  SpO2: 100% 100% 100%   Weight:      Height:       CBC:  Recent Labs  Lab 06/19/20 0446 06/20/20 0433 06/23/20 0506 06/24/20 0146  WBC 12.2* 11.0* 11.4* 13.8*  NEUTROABS 8.8* 8.1*  --   --   HGB 11.5* 11.7* 10.8* 11.1*  HCT 32.9* 35.3* 35.4* 36.2*  MCV 94.8 99.4 104.7* 104.3*  PLT 175 186 180 186   Basic Metabolic Panel:  Recent Labs  Lab 06/19/20 0446 06/19/20 1002 06/20/20 0433 06/20/20 1035 06/21/20 0422 06/21/20 0952 06/23/20 0506 06/23/20 1122 06/24/20 0146  NA 139   < > 152*   < > 155*   < > 154* 154* 155*  K 3.9  --  4.3  --   --    < > 4.1  --  4.3  CL 106  --  121*  --   --    < > 123*  --  124*  CO2 24  --  24  --   --    < > 23  --  25  GLUCOSE 136*  --  155*  --   --    < > 138*  --  139*  BUN 35*  --  25*  --   --    < > 37*  --  36*  CREATININE 0.97  --  0.72  --   --    < > 0.63  --  1.08  CALCIUM 8.1*  --  8.1*  --   --    < > 8.2*  --  8.0*  MG 2.5*  --  2.6*  --   --   --   --   --   --   PHOS 2.3*  --  2.2*  --  3.2  --   --   --   --    < > = values in this interval not displayed.   Lipid Panel:  Recent Labs  Lab 06/23/20 0506  TRIG 118   HgbA1c:  No results for input(s): HGBA1C in the last 168 hours. Urine Drug Screen:  No results for input(s): LABOPIA, COCAINSCRNUR, LABBENZ, AMPHETMU, THCU, LABBARB in the last 168 hours.  Alcohol Level No results for input(s): ETH in the last 168  hours.  IMAGING past 24 hours ECHOCARDIOGRAM COMPLETE  Result Date: 06/17/2020 IMPRESSIONS   1. Left ventricular ejection fraction, by estimation, is 70 to 75%. The left ventricle has hyperdynamic function. The left ventricle has no regional wall motion abnormalities. Left ventricular diastolic parameters are consistent with Grade I diastolic dysfunction (impaired relaxation).   2. Right ventricular systolic function is normal. The right ventricular size is normal. Tricuspid regurgitation signal is inadequate for assessing PA pressure.   3. The mitral valve is normal in structure. Trivial mitral valve regurgitation. No evidence  of mitral stenosis.   4. The aortic valve is tricuspid. Aortic valve regurgitation is not visualized. Mild aortic valve sclerosis is present, with no evidence of aortic valve stenosis.   5. The inferior vena cava is normal in size with greater than 50% respiratory variability, suggesting right atrial pressure of 3 mmHg.    IMAGING past 24 hours MR BRAIN W WO CONTRAST  Result Date: 06/15/2020 CLINICAL DATA:   IMPRESSION: 1. Innumerable foci of hemosiderin deposition scattered throughout the brain consistent with previous hemorrhagic infarctions. Intraparenchymal hematoma at the right frontoparietal junction with internal clot retraction, measuring 7.8 x 4.7 x 4.6 cm. No abnormal enhancement to suggest that this represents a hemorrhagic mass. Small hemorrhage in the left temporal lobe cannot be specifically identified by MRI as different than the other foci of hemosiderin deposition. 2. Small amount of subarachnoid hemorrhage within the sulci. 3. Mild mass-effect upon the right lateral ventricle with right-to-left shift 3 mm. Chronic small-vessel ischemic changes of the white matter elsewhere. The overall pattern could either be due to amyloid angiopathy or hypertensive vascular disease. 4. No finding to suggest the presence metastatic disease. Electronically Signed   By: Paulina Fusi M.D.   On: 06/15/2020 22:56   DG CHEST PORT 1 VIEW  Result Date: 06/15/2020 CLINICAL DATA:  Unresponsive. EXAM: PORTABLE CHEST 1 VIEW COMPARISON:  None. FINDINGS: The heart size and mediastinal contours are within normal limits. Both lungs are clear. The visualized skeletal structures are unremarkable. IMPRESSION: No active disease. Electronically Signed   By: Lupita Raider M.D.   On: 06/15/2020 20:29   CT HEAD CODE STROKE WO CONTRAST  Result Date: 06/15/2020 IMPRESSION:  1. Right frontoparietal parenchymal hemorrhage measuring 7.6 cm. Sub-5 mm left temporal hemorrhage.  2. No significant midline shift or ventricular entrapment.  3. Right greater than left cerebral convexity subarachnoid blood products.  4. ASPECTS is 10.   PHYSICAL EXAM  Temp:  [98.5 F (36.9 C)-101.2 F (38.4 C)] 99.4 F (37.4 C) (03/06 0800) Pulse Rate:  [67-81] 69 (03/06 0734) Resp:  [18-27] 19 (03/06 0734) BP: (108-125)/(41-48) 122/48 (03/06 0734) SpO2:  [92 %-100 %] 100 % (03/06 0734) FiO2 (%):  [30 %-40 %] 30 % (03/06 0734) Weight:  [81.2 kg] 81.2 kg (03/06 0412)  General - Well nourished, well developed, intubated on low dose precedex.  Ophthalmologic - fundi not visualized due to noncooperation.  Cardiovascular - Regular rate and rhythm.  Neuro - intubated on precedex, eyes open on voice, able to follow midline and peripheral commands on the right. With eye opening, eyes in right gaze position, not blinking to visual threat on the left but blinking to visual threat on the right, not tracking, PERRL. Corneal reflex present, gag and cough present. On weaning trial.  Facial symmetry not able to test due to ET tube.  Tongue protrusion not cooperative. Right UE 4-/5 with minimal drift, mildly increased muscle tone. RLE 3/5 knee flexion and 3/5 ankle toe DF/PF. On pain stimulation, mild withdraw of LLE and no movement of LUE. Positive left babinski. Sensation, coordination not cooperative and gait  not tested.    ASSESSMENT/PLAN  86 year old male with very minimal past medical history. Ate dinner with his wife at 1730 and having a conversationtill 1800 hrs. Abruptly at 1815 was found to be down. EMS on arrival found his blood pressure to be high systolic 200s with slurred speech and left sided weakness. He had an episode of emesis on route and was transiently hypoxemic.  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   CTA head & neck stable ICH, Mild to moderate right M2, right ICA terminus, distal basilar artery and bilateral PCA narrowing.  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 The overall pattern could either be due to amyloid angiopathy or hypertensive vascular disease.  No AC/AP  Long term BP goal < 140   Hypertension  Home meds: none, no hx of HTN but with BP 200s on EMS arrival  Currently on Norvasc 10mg  daily, coreg 25mg  bid  off cleviprex infusion  Labetalol 20mg  q2hr prn, hydralazine 20mg  q6hr prn  Long-term BP goal < 140 due to CAA  Hyperlipidemia  Home meds:  None   LDL 103, goal < 70  No statin at this time due to ICH and concern of  CAA  Dysphagia  Secondary to stroke  SLP following  Cortrak in place  On Tube feeds and FW  Aspiration Pneumonia  Vomited in route w/EMS  Febrile: Tmax 101.2->afebile  Leukocytosis: WBC 19.7 -> 21.2 ->12.2->11.4->13.8  CXR bibasilar opacities  On Zosyn now  Blood cultures (3/6): pending  Sputum culture (2/28): Negative  Respiratory failure  Likely related to aspiration  Intubated   On weaning trial (tolerated weaning 12h 3/5)  Plan to extubate 3/7  CCM on board  Other Stroke Risk Factors  Advanced Age >/= 15    Hospital day # 9  This patient is critically ill due to large right ICH and mass effect, probable CAA, respiratory failure, aspiration, fever and at significant risk of neurological worsening, death form seizure, hematoma expansion, brain herniation, sepsis, heart failure. This patient's care requires constant monitoring of vital signs, hemodynamics, respiratory and cardiac monitoring, review of multiple databases, neurological assessment, discussion with family, other specialists and medical decision making of high complexity. I spent 35 minutes of neurocritical care time in the care of this patient.   , MD PhD Stroke Neurology 06/24/2020 12:15 PM     To contact Stroke Continuity provider, please refer to 04-16-1972. After hours, contact General Neurology

## 2020-06-25 DIAGNOSIS — I611 Nontraumatic intracerebral hemorrhage in hemisphere, cortical: Secondary | ICD-10-CM | POA: Diagnosis not present

## 2020-06-25 DIAGNOSIS — Z789 Other specified health status: Secondary | ICD-10-CM | POA: Diagnosis not present

## 2020-06-25 DIAGNOSIS — I619 Nontraumatic intracerebral hemorrhage, unspecified: Secondary | ICD-10-CM

## 2020-06-25 LAB — CBC WITH DIFFERENTIAL/PLATELET
Abs Immature Granulocytes: 0.15 10*3/uL — ABNORMAL HIGH (ref 0.00–0.07)
Basophils Absolute: 0 10*3/uL (ref 0.0–0.1)
Basophils Relative: 0 %
Eosinophils Absolute: 0.5 10*3/uL (ref 0.0–0.5)
Eosinophils Relative: 4 %
HCT: 34.5 % — ABNORMAL LOW (ref 39.0–52.0)
Hemoglobin: 10.7 g/dL — ABNORMAL LOW (ref 13.0–17.0)
Immature Granulocytes: 1 %
Lymphocytes Relative: 12 %
Lymphs Abs: 1.8 10*3/uL (ref 0.7–4.0)
MCH: 32.6 pg (ref 26.0–34.0)
MCHC: 31 g/dL (ref 30.0–36.0)
MCV: 105.2 fL — ABNORMAL HIGH (ref 80.0–100.0)
Monocytes Absolute: 1.3 10*3/uL — ABNORMAL HIGH (ref 0.1–1.0)
Monocytes Relative: 9 %
Neutro Abs: 11.2 10*3/uL — ABNORMAL HIGH (ref 1.7–7.7)
Neutrophils Relative %: 74 %
Platelets: 190 10*3/uL (ref 150–400)
RBC: 3.28 MIL/uL — ABNORMAL LOW (ref 4.22–5.81)
RDW: 12.9 % (ref 11.5–15.5)
WBC: 15.1 10*3/uL — ABNORMAL HIGH (ref 4.0–10.5)
nRBC: 0 % (ref 0.0–0.2)

## 2020-06-25 LAB — GLUCOSE, CAPILLARY
Glucose-Capillary: 119 mg/dL — ABNORMAL HIGH (ref 70–99)
Glucose-Capillary: 120 mg/dL — ABNORMAL HIGH (ref 70–99)
Glucose-Capillary: 132 mg/dL — ABNORMAL HIGH (ref 70–99)
Glucose-Capillary: 139 mg/dL — ABNORMAL HIGH (ref 70–99)
Glucose-Capillary: 148 mg/dL — ABNORMAL HIGH (ref 70–99)
Glucose-Capillary: 168 mg/dL — ABNORMAL HIGH (ref 70–99)

## 2020-06-25 LAB — BASIC METABOLIC PANEL
Anion gap: 8 (ref 5–15)
BUN: 32 mg/dL — ABNORMAL HIGH (ref 8–23)
CO2: 23 mmol/L (ref 22–32)
Calcium: 7.8 mg/dL — ABNORMAL LOW (ref 8.9–10.3)
Chloride: 118 mmol/L — ABNORMAL HIGH (ref 98–111)
Creatinine, Ser: 1.05 mg/dL (ref 0.61–1.24)
GFR, Estimated: >60 mL/min (ref >60)
Glucose, Bld: 176 mg/dL — ABNORMAL HIGH (ref 70–99)
Potassium: 3.9 mmol/L (ref 3.5–5.1)
Sodium: 149 mmol/L — ABNORMAL HIGH (ref 135–145)

## 2020-06-25 MED ORDER — CHLORHEXIDINE GLUCONATE 0.12 % MT SOLN
15.0000 mL | Freq: Two times a day (BID) | OROMUCOSAL | Status: DC
  Administered 2020-06-25 – 2020-07-02 (×14): 15 mL via OROMUCOSAL
  Filled 2020-06-25 (×8): qty 15

## 2020-06-25 MED ORDER — FREE WATER
200.0000 mL | Freq: Four times a day (QID) | Status: DC
  Administered 2020-06-25 – 2020-07-03 (×33): 200 mL

## 2020-06-25 MED ORDER — BISACODYL 10 MG RE SUPP
10.0000 mg | Freq: Once | RECTAL | Status: AC
  Administered 2020-06-25: 10 mg via RECTAL
  Filled 2020-06-25: qty 1

## 2020-06-25 MED ORDER — LABETALOL HCL 5 MG/ML IV SOLN
5.0000 mg | INTRAVENOUS | Status: DC | PRN
  Administered 2020-06-25 – 2020-06-26 (×2): 5 mg via INTRAVENOUS
  Filled 2020-06-25: qty 4

## 2020-06-25 MED ORDER — ORAL CARE MOUTH RINSE
15.0000 mL | Freq: Two times a day (BID) | OROMUCOSAL | Status: DC
  Administered 2020-06-26 – 2020-07-05 (×16): 15 mL via OROMUCOSAL

## 2020-06-25 NOTE — Progress Notes (Signed)
Occupational Therapy Treatment Patient Details Name: Calvin Yates MRN: [id number] DOB: 04-10-1935 Today's Date: 06/25/2020    History of present illness The pt is an 86 yo male presenting with elevated BP and tachycardia after being found down by his wife. 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   OT comments  Pt presenting with decreased cognition, balance, vision, strength, and functional use of LUE. Pt requiring Max A +2 for bed mobility and sitting at EOB with Max A +2. Pt following simple commands (inconsistently) and participating in BLE ROM exercises and grooming. Continue to recommend dc to CIR and will continue to follow acutely as admitted.   Follow Up Recommendations  CIR;Supervision/Assistance - 24 hour    Equipment Recommendations  3 in 1 bedside commode;Wheelchair (measurements OT);Wheelchair cushion (measurements OT);Hospital bed    Recommendations for Other Services Rehab consult    Precautions / Restrictions Precautions Precautions: Fall Precaution Comments: c-spine cleared, no need for c-collar Restrictions Weight Bearing Restrictions: No       Mobility Bed Mobility Overal bed mobility: Needs Assistance Bed Mobility: Rolling;Sidelying to Sit;Sit to Supine Rolling: Max assist;+2 for physical assistance Sidelying to sit: Max assist;+2 for physical assistance   Sit to supine: Max assist;+2 for physical assistance   General bed mobility comments: pt required cues for all movement, but was unable to follow complex commands to reach EOB with voluntary movement. maxA to BLE and to facilitate hip hinge with elevation of trunk from [facility]. maxA to return to supine    Transfers                 General transfer comment: deferred at this time due to inconsistent command following and safety concerns with progressive pt fatigue    Balance Overall balance assessment: Needs assistance Sitting-balance support:  Feet supported;Single extremity supported Sitting balance-Leahy Scale: Zero Sitting balance - Comments: maxA of 1 with moments of progression to min-modA with propping and positioning of RUE. Postural control: Posterior lean;Right lateral lean                                 ADL either performed or assessed with clinical judgement   ADL Overall ADL's : Needs assistance/impaired     Grooming: Maximal assistance;Sitting Grooming Details (indicate cue type and reason): Max hand over hand for bringing R hand to face and wash eyes. Pt following commands but requiting increased time and effort. Support provided to elbow and hand.                               General ADL Comments: FOcused session on sitting balance and activity tolerance at EOB     Vision   Vision Assessment?: Vision impaired- to be further tested in functional context   Perception     Praxis      Cognition Arousal/Alertness: Awake/alert;Lethargic Behavior During Therapy: Flat affect Overall Cognitive Status: Impaired/Different from baseline Area of Impairment: Following commands;Awareness;Problem solving;Attention                   Current Attention Level: Focused   Following Commands: Follows one step commands inconsistently;Follows one step commands with increased time (80% with R extremities, 20% with significantly increased time with LLE)   Awareness: Intellectual Problem Solving: Slow processing;Decreased initiation;Difficulty sequencing;Requires verbal cues;Requires tactile cues General Comments: pt making attempts to  follow most commands with R-sided extremities. limited at times by weakness and slightly increased processing time. pt followed command to kick with LLE x3 while sitting EOB, but required significantly increased time and x1 rep of PROM prior to pt performing active movement. Pt nodding yes/no at start of session, less interactive with nods by end of session due  to fatigue. not able to locate or track visually at this time despite objects being placed in R visual field        Exercises Exercises: General Lower Extremity General Exercises - Lower Extremity Long Arc Quad: AAROM;Both;5 reps;Seated Hip Flexion/Marching: AAROM;Both;5 reps;Seated   Shoulder Instructions       General Comments on vent, FiO2 30% PEEP 5, VSS through session. edema in BUE, especially in hands    Pertinent Vitals/ Pain       Pain Assessment: Faces Faces Pain Scale: Hurts a little bit Pain Location: grimacing with general repositioning/movement Pain Descriptors / Indicators: Discomfort;Grimacing Pain Intervention(s): Monitored during session;Limited activity within patient's tolerance;Repositioned  Home Living                                          Prior Functioning/Environment              Frequency  Min 2X/week        Progress Toward Goals  OT Goals(current goals can now be found in the care plan section)  Progress towards OT goals: Progressing toward goals  Acute Rehab OT Goals Patient Stated Goal: none stated OT Goal Formulation: Patient unable to participate in goal setting Time For Goal Achievement: 06/30/20 Potential to Achieve Goals: Fair ADL Goals Pt Will Perform Eating: with min assist;with adaptive utensils;sitting;bed level Pt Will Perform Grooming: with min guard assist;sitting Pt Will Transfer to Toilet: with mod assist;stand pivot transfer;bedside commode Pt/caregiver will Perform Home Exercise Program: Increased ROM;Increased strength;Left upper extremity;With minimal assist Additional ADL Goal #1: Pt will follow 1-2 step commands with multimodal cues for sequencing. Additional ADL Goal #2: Pt will perform bed mobility with modA as precursor for ADL.  Plan Discharge plan remains appropriate    Co-evaluation    PT/OT/SLP Co-Evaluation/Treatment: Yes Reason for Co-Treatment: Complexity of the patient's  impairments (multi-system involvement);For patient/therapist safety;To address functional/ADL transfers PT goals addressed during session: Mobility/safety with mobility;Balance;Strengthening/ROM OT goals addressed during session: ADL's and self-care      AM-PAC OT "6 Clicks" Daily Activity     Outcome Measure   Help from another person eating meals?: Total Help from another person taking care of personal grooming?: A Little Help from another person toileting, which includes using toliet, bedpan, or urinal?: Total Help from another person bathing (including washing, rinsing, drying)?: Total Help from another person to put on and taking off regular upper body clothing?: Total Help from another person to put on and taking off regular lower body clothing?: Total 6 Click Score: 8    End of Session Equipment Utilized During Treatment: Oxygen (Vent)  OT Visit Diagnosis: Unsteadiness on feet (R26.81);Muscle weakness (generalized) (M62.81);Cognitive communication deficit (R41.841);Other symptoms and signs involving cognitive function;Hemiplegia and hemiparesis Symptoms and signs involving cognitive functions: Nontraumatic intracerebral hemorrhage;Other Nontraumatic ICH Hemiplegia - Right/Left: Left Hemiplegia - dominant/non-dominant: Non-Dominant Hemiplegia - caused by: Other cerebrovascular disease   Activity Tolerance Patient limited by lethargy   Patient Left in bed;with call bell/phone within reach;with bed alarm set;with family/visitor present  Nurse Communication Mobility status        Time: 1023-1050 OT Time Calculation (min): 27 min  Charges: OT General Charges $OT Visit: 1 Visit OT Treatments $Self Care/Home Management : 8-22 mins  Sparsh Callens MSOT, OTR/L Acute Rehab Pager: [phone] Office: [phone]   Theodoro Grist M[place]iah [place] 06/25/2020, 12:45 PM

## 2020-06-25 NOTE — Progress Notes (Addendum)
STROKE TEAM PROGRESS NOTE   INTERVAL HISTORY RN is at the bedside. Pt still intubated on no precedex, eyes open on voice but a little more lethargic than yesterday. No fever today, still following simple commands on the right. Still has left hemiplegia. Na 149today, on Zosyn. Tolerated weaning for 12h yesterday, will do again today, plan to extubate today.  Vitals:   06/25/20 0900 06/25/20 0956 06/25/20 1000 06/25/20 1100  BP: (!) 110/55 (!) 134/59 (!) 124/51 (!) 110/52  Pulse: 65  69 71  Resp: 20  (!) 21 (!) 25  Temp:      TempSrc:      SpO2: 99%  100% 100%  Weight:      Height:       CBC:  Recent Labs  Lab 06/20/20 0433 06/23/20 0506 06/24/20 0146 06/25/20 0754  WBC 11.0*   < > 13.8* 15.1*  NEUTROABS 8.1*  --   --  11.2*  HGB 11.7*   < > 11.1* 10.7*  HCT 35.3*   < > 36.2* 34.5*  MCV 99.4   < > 104.3* 105.2*  PLT 186   < > 186 190   < > = values in this interval not displayed.   Basic Metabolic Panel:  Recent Labs  Lab 06/19/20 0446 06/19/20 1002 06/20/20 0433 06/20/20 1035 06/21/20 0422 06/21/20 0952 06/24/20 0146 06/25/20 0754  NA 139   < > 152*   < > 155*   < > 155* 149*  K 3.9  --  4.3  --   --    < > 4.3 3.9  CL 106  --  121*  --   --    < > 124* 118*  CO2 24  --  24  --   --    < > 25 23  GLUCOSE 136*  --  155*  --   --    < > 139* 176*  BUN 35*  --  25*  --   --    < > 36* 32*  CREATININE 0.97  --  0.72  --   --    < > 1.08 1.05  CALCIUM 8.1*  --  8.1*  --   --    < > 8.0* 7.8*  MG 2.5*  --  2.6*  --   --   --   --   --   PHOS 2.3*  --  2.2*  --  3.2  --   --   --    < > = values in this interval not displayed.   Lipid Panel:  Recent Labs  Lab 06/23/20 0506  TRIG 118   HgbA1c:  No results for input(s): HGBA1C in the last 168 hours. Urine Drug Screen:  No results for input(s): LABOPIA, COCAINSCRNUR, LABBENZ, AMPHETMU, THCU, LABBARB in the last 168 hours.  Alcohol Level No results for input(s): ETH in the last 168 hours.  IMAGING past 24  hours ECHOCARDIOGRAM COMPLETE  Result Date: 06/17/2020 IMPRESSIONS   1. Left ventricular ejection fraction, by estimation, is 70 to 75%. The left ventricle has hyperdynamic function. The left ventricle has no regional wall motion abnormalities. Left ventricular diastolic parameters are consistent with Grade I diastolic dysfunction (impaired relaxation).   2. Right ventricular systolic function is normal. The right ventricular size is normal. Tricuspid regurgitation signal is inadequate for assessing PA pressure.   3. The mitral valve is normal in structure. Trivial mitral valve regurgitation. No evidence of mitral stenosis.   4.  The aortic valve is tricuspid. Aortic valve regurgitation is not visualized. Mild aortic valve sclerosis is present, with no evidence of aortic valve stenosis.   5. The inferior vena cava is normal in size with greater than 50% respiratory variability, suggesting right atrial pressure of 3 mmHg.    IMAGING past 24 hours MR BRAIN W WO CONTRAST  Result Date: 06/15/2020 CLINICAL DATA:   IMPRESSION: 1. Innumerable foci of hemosiderin deposition scattered throughout the brain consistent with previous hemorrhagic infarctions. Intraparenchymal hematoma at the right frontoparietal junction with internal clot retraction, measuring 7.8 x 4.7 x 4.6 cm. No abnormal enhancement to suggest that this represents a hemorrhagic mass. Small hemorrhage in the left temporal lobe cannot be specifically identified by MRI as different than the other foci of hemosiderin deposition. 2. Small amount of subarachnoid hemorrhage within the sulci. 3. Mild mass-effect upon the right lateral ventricle with right-to-left shift 3 mm. Chronic small-vessel ischemic changes of the white matter elsewhere. The overall pattern could either be due to amyloid angiopathy or hypertensive vascular disease. 4. No finding to suggest the presence metastatic disease. Electronically Signed   By: Paulina Fusi M.D.   On:  06/15/2020 22:56   DG CHEST PORT 1 VIEW  Result Date: 06/15/2020 CLINICAL DATA:  Unresponsive. EXAM: PORTABLE CHEST 1 VIEW COMPARISON:  None. FINDINGS: The heart size and mediastinal contours are within normal limits. Both lungs are clear. The visualized skeletal structures are unremarkable. IMPRESSION: No active disease. Electronically Signed   By: Lupita Raider M.D.   On: 06/15/2020 20:29   CT HEAD CODE STROKE WO CONTRAST  Result Date: 06/15/2020 IMPRESSION:  1. Right frontoparietal parenchymal hemorrhage measuring 7.6 cm. Sub-5 mm left temporal hemorrhage.  2. No significant midline shift or ventricular entrapment.  3. Right greater than left cerebral convexity subarachnoid blood products.  4. ASPECTS is 10.   PHYSICAL EXAM  Temp:  [98.4 F (36.9 C)-100 F (37.8 C)] 98.4 F (36.9 C) (03/07 0800) Pulse Rate:  [62-88] 71 (03/07 1100) Resp:  [16-27] 25 (03/07 1100) BP: (103-143)/(42-59) 110/52 (03/07 1100) SpO2:  [95 %-100 %] 100 % (03/07 1100) FiO2 (%):  [30 %] 30 % (03/07 0800)  General - Well nourished, well developed, intubated on low dose precedex.  Ophthalmologic - fundi not visualized due to noncooperation.  Cardiovascular - Regular rate and rhythm.  Neuro - intubated on no precedex, eyes open on voice, able to follow midline and peripheral commands on the right. With eye opening, eyes in right gaze position, not blinking to visual threat on the left but blinking to visual threat on the right, not tracking, PERRL. Corneal reflex present, gag and cough present. On weaning trial.  Facial symmetry not able to test due to ET tube.  Tongue protrusion not cooperative. Right UE 4-/5 with minimal drift, mildly increased muscle tone. RLE 3/5 knee flexion and 3/5 ankle toe DF/PF. On pain stimulation, mild withdraw of LLE and no movement of LUE. Positive left babinski. Sensation, coordination not cooperative and gait not tested.    ASSESSMENT/PLAN  86 year old male with very  minimal past medical history. Ate dinner with his wife at 1730 and having a conversationtill 1800 hrs. Abruptly at 1815 was found to be down. EMS on arrival found his blood pressure to be high systolic 200s with slurred speech and left sided weakness. He had an episode of emesis on route and was transiently hypoxemic.  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   CTA head & neck stable ICH, Mild to moderate right M2, right ICA terminus, distal basilar artery and bilateral PCA narrowing.  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 The overall pattern could either be due to amyloid angiopathy or hypertensive vascular disease.  No antiplatelets or anticoagulation.  Long term BP goal < 140   Hypertension  Home meds: none, no hx of HTN but with BP 200s on EMS arrival  Currently on Norvasc 10mg  daily, coreg 25mg  bid  off cleviprex infusion  Labetalol 20mg  q2hr prn, hydralazine 20mg  q6hr prn  Long-term BP goal < 140 due to CAA  Hyperlipidemia  Home meds:  None   LDL 103, goal < 70  No statin at this time due to ICH and concern of CAA  Dysphagia  Secondary to stroke  SLP  following  Cortrak in place  On Tube feeds and FW  Aspiration Pneumonia  Vomited in route w/EMS  Febrile: Tmax 101.2->afebile  Leukocytosis: WBC 19.7 -> 21.2 ->12.2->11.4->13.8-->15.1  CXR bibasilar opacities  On Zosyn now-continue abx  Blood cultures (3/6): pending  Sputum culture (2/28): Negative   Respiratory failure  Likely related to aspiration  Intubated   On weaning trial (tolerated weaning 12h 3/5)  Plan to extubate 3/7  CCM on board  Other Stroke Risk Factors  Advanced Age >/= 62    Appreciate PCCM assistance Discussed with Dr. day # 10  This patient is critically ill due to large right ICH and mass effect, probable CAA, respiratory failure, aspiration, fever and at significant risk of neurological worsening, death form seizure, hematoma expansion, brain herniation, sepsis, heart failure. This patient's care requires constant monitoring of vital signs, hemodynamics, respiratory and cardiac monitoring, review of multiple databases, neurological assessment, discussion with family, other specialists and medical decision making of high complexity. I spent 38 minutes of neurocritical care time in the care of this patient.  -- , MD Neurologist [facility] Pager: [phone]      To contact Stroke Continuity provider, please refer to 5/7. After hours, contact General Neurology

## 2020-06-25 NOTE — Procedures (Signed)
Extubation Procedure Note  Patient Details:   Name: Calvin Yates DOB: 11-25-1934 MRN: [id number]   Airway Documentation:    Vent end date: 06/25/20 Vent end time: 1205   Evaluation  O2 sats: stable throughout Complications: No apparent complications Patient did tolerate procedure well. Bilateral Breath Sounds: Rhonchi,Diminished   No   Pt extubated to 4L  per MD order. Pt has a strong productive cough but is unable to cough post extubation due to stroke. MD aware. Positive cuff leak noted prior to extubation. VS within normal limits. RT will continue to closely monitor pt.   Ermalinda Barrios M 06/25/2020, 2:28 PM

## 2020-06-25 NOTE — Progress Notes (Addendum)
NAME:  Calvin Yates, MRN:  [id number], DOB:  1934-08-06, LOS: 10 ADMISSION DATE:  06/15/2020, CONSULTATION DATE:  06/15/20 REFERRING MD:  Dr Iver Nestle - Neuro, CHIEF COMPLAINT: Acute encephalopathy due to ICH  Brief History:  86 year old male, LKW 2/25 1800 at dinner with wife, found down 1815 with SBP > 200, found to have 7cm R frontoparietal ICH without midline shift, L-sided deficits. PCCM consulted for worsening encephalopathy/concern for respiratory failure.  Significant Hospital Events:  2/25 - Admit 3/03 - Intubated  Consults:  PCCM  Procedures:  RUE PICC 3/1 >> ETT 3/3 >>  Significant Diagnostic Tests:   CT HEAD 2/25 > Right frontoparietal parenchymal hemorrhage measuring 7.6 cm. Sub-5 mm left temporal hemorrhage. No significant midline shift or ventricular entrapment. Right greater than left cerebral convexity subarachnoid blood products.  CXR 06/18/2020 Interval placement of feeding tube that extends below the diaphragm into the stomach. New bibasilar opacities may be consistent with atelectasis but could also be consistent with aspiration pneumonia given clinical history. No overt edema, pleural fluid or pneumothorax.  CT Head wo contrast 06/19/2020 Slight enlargement in the parenchymal hemorrhage in the right frontal parietal region as described. Minimal midline shift from right to left is noted. Stable small parenchymal hemorrhage in the left temporal lobe unchanged from the prior exam.   Micro Data:  RVP panel 2/25 >> Negative  MRSA PCR 2/25 >> Negative  Respiratory Cx 2/28 >> Normal flora  Blood Cx 2/28 >> negative   Antimicrobials:  Rocephin 2/28 >> 3/4 Zosyn 3/6 >>  Interim History / Subjective:  Patient remains intubated, sedated on Precedex. Tmax ~100F, WBC rising 13.8 (11.4) Mild AKI Cr 1.0 (0.6) Tolerating weaning on 5/5, 30%  Objective   Blood pressure (!) 117/51, pulse 62, temperature 99.6 F (37.6 C), temperature source Axillary, resp. rate 18, height  5\' 8"  (1.727 m), weight 81.2 kg, SpO2 100 %.    Vent Mode: PRVC FiO2 (%):  [30 %] 30 % Set Rate:  [16 bmp] 16 bmp Vt Set:  [550 mL] 550 mL PEEP:  [5 cmH20] 5 cmH20 Pressure Support:  [5 cmH20] 5 cmH20 Plateau Pressure:  [13 cmH20-14 cmH20] 14 cmH20   Intake/Output Summary (Last 24 hours) at 06/25/2020 0736 Last data filed at 06/25/2020 0600 Gross per 24 hour  Intake 1212.23 ml  Output 600 ml  Net 612.23 ml   Filed Weights   06/22/20 0500 06/23/20 0500 06/24/20 0412  Weight: 78.4 kg 84.1 kg 81.2 kg   Physical Examination: General: Acutely ill-appearing man, lying in bed in NAD. HEENT: Anicteric sclera, moist mucous membranes. ETT in place. Neuro: Intubated, sedated on Precedex. Awakens to significant verbal stimuli. Not following commands. CV: S1S2, RRR, no m/g/r. PULM: Breathing even and unlabored on vent, weaning (PS 5/5), FiO2 30%. Lung fields diminished at bilateral bases. GI: Soft, nontender, nondistended. Extremities: No LE edema noted. R hand in mitt restraint. Skin: Warm/dry, no rashes noted.  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 - Appreciate Neuro input - Goal Na 150-155, on FWF Q6H - Goal SBP < 160 - Neuroprotective measures: HOB > 30 degrees, normoglycemia, electrolytes within normal limits - Seizure precautions - Eventual need for aggressive PT/OT/SLP  once clinically appropriate  Acute hypoxic respiratory failure requiring MV Aspiration pneumonia  Per report, vomited en route to hospital. Respiratory culture with normal respiratory flora. Required urgent intubation overnight 3/4. Mental status remains  largest barrier to wean/extubation. - Continue full vent support (4-8cc/kg IBW) - Wean FiO2 as able for goal sat > 90% - Daily WUA/SBT - Vap bundle - Pulmonary hygiene with guaifenesin - PAD protocol with Precedex/Fentanyl for sedation, goal RASS 0 to -1  Pyrexia, leukocytosis; no obvious source of infection S/p 5-day course of ceftriaxone with continued leukocytosis/fever. - Continue Zosyn (end date TBD) - F/u culture data  Hypertensive emergency associated with intracranial hemorrhage> now controlled SBP on EMS arrival reportedly > 200. Echo 2/27 LVEF 70 to 75% and grade 1 diastolic dysfunction. - Continue amlodipine - Continue Coreg -  Hydralazine/Labetalol PRN for goal SBP < 160  Hypergylcemia - controlled, has not been requiring insulin. - SSI - Goal CBG 140-180  Acute anemia, likely due to acute illness - Transfuse for Hgb < 7 - Trend CBC - Monitor for active bleeding  Best practice (evaluated daily)  Diet: TFs Pain/Anxiety/Delirium protocol (if indicated): PAD protocol VAP protocol (if indicated): In place DVT prophylaxis: Lovenox GI prophylaxis: PPI Glucose control: SSI Mobility: Bedrest Disposition: Neuro ICU  Goals of Care:   Family Updates: Per primary  Critical care time: 78 minutes   [name], PA-C [facility] 06/25/20 7:37 AM  Please see [email or web address] for pager details.

## 2020-06-25 NOTE — Progress Notes (Signed)
Inpatient Rehab Admissions Coordinator:   Note pt remains intubated.  Will sign off for CIR at this time.  If pt is extubated and able to participate in therapies, please feel free to re-consult CIR.    Estill Dooms, PT, DPT Admissions Coordinator [phone] 06/25/20  10:58 AM

## 2020-06-25 NOTE — Progress Notes (Signed)
Physical Therapy Treatment Patient Details Name: Calvin Yates MRN: [id number] DOB: 1934-08-09 Today's Date: 06/25/2020    History of Present Illness The pt is an 86 yo male presenting with elevated BP and tachycardia after being found down by his wife. 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    PT Comments    The pt was seen by PT/OT to safely progress functional transfers. The pt was able to complete bed mobility and maintain sitting EOB for 5-10 min, but required maxA of 1-2 to maintain due to posterior lean and poor use of UE to steady. The pt was able to follow ~80% of commands with R-sided extremities with increased time, and attempted to follow commands with LLE but requires increased time and facilitation by PT prior to movement by pt. The pt was unable to visually locate or track at this time, even with objects placed in R visual field. The pt will continue to benefit from skilled PT to progress functional stability, strength, and cognition to allow for further progression of OOB mobility and transfers.    Follow Up Recommendations  CIR     Equipment Recommendations   (defer to post acute)    Recommendations for Other Services       Precautions / Restrictions Precautions Precautions: Fall Precaution Comments: pt c-spine cleared, no need for c-collar Restrictions Weight Bearing Restrictions: No    Mobility  Bed Mobility Overal bed mobility: Needs Assistance Bed Mobility: Rolling;Sidelying to Sit;Sit to Supine Rolling: Max assist;+2 for physical assistance Sidelying to sit: Max assist;+2 for physical assistance   Sit to supine: Max assist;+2 for physical assistance   General bed mobility comments: pt required cues for all movement, but was unable to follow complex commands to reach EOB with voluntary movement. maxA to BLE and to facilitate hip hinge with elevation of trunk from [facility]. maxA to return to supine     Transfers                 General transfer comment: deferred at this time due to inconsistent command following and safety concerns with progressive pt fatigue     Modified Rankin (Stroke Patients Only) Modified Rankin (Stroke Patients Only) Pre-Morbid Rankin Score: No symptoms Modified Rankin: Severe disability     Balance Overall balance assessment: Needs assistance Sitting-balance support: Feet supported;Single extremity supported Sitting balance-Leahy Scale: Zero Sitting balance - Comments: maxA of 1 with moments of progression to min-modA with propping and positioning of RUE. Postural control: Posterior lean;Right lateral lean                                  Cognition Arousal/Alertness: Awake/alert;Lethargic Behavior During Therapy: Flat affect Overall Cognitive Status: Impaired/Different from baseline Area of Impairment: Following commands;Awareness;Problem solving;Attention                   Current Attention Level: Focused   Following Commands: Follows one step commands inconsistently;Follows one step commands with increased time (80% with R extremities, 20% with significantly increased time with LLE)   Awareness: Intellectual Problem Solving: Slow processing;Decreased initiation;Difficulty sequencing;Requires verbal cues;Requires tactile cues General Comments: pt making attempts to follow most commands with R-sided extremities. limited at times by weakness and slightly increased processing time. pt followed command to kick with LLE x3 while sitting EOB, but required significantly increased time and x1 rep of PROM prior to pt performing active  movement. Pt nodding yes/no at start of session, less interactive with nods by end of session due to fatigue. not able to locate or track visually at this time despite objects being placed in R visual field      Exercises General Exercises - Lower Extremity Long Arc Quad: AAROM;Both;5  reps;Seated Hip Flexion/Marching: AAROM;Both;5 reps;Seated    General Comments General comments (skin integrity, edema, etc.): on vent, FiO2 30% PEEP 5, VSS through session. edema in BUE, especially in hands      Pertinent Vitals/Pain Pain Assessment: Faces Faces Pain Scale: Hurts a little bit Pain Location: grimacing with general repositioning/movement Pain Descriptors / Indicators: Discomfort;Grimacing Pain Intervention(s): Monitored during session;Repositioned           PT Goals (current goals can now be found in the care plan section) Acute Rehab PT Goals Patient Stated Goal: none stated PT Goal Formulation: Patient unable to participate in goal setting Time For Goal Achievement: 06/30/20 Potential to Achieve Goals: Fair Progress towards PT goals: Progressing toward goals (slowly)    Frequency    Min 4X/week      PT Plan Current plan remains appropriate    Co-evaluation PT/OT/SLP Co-Evaluation/Treatment: Yes Reason for Co-Treatment: Complexity of the patient's impairments (multi-system involvement);For patient/therapist safety;To address functional/ADL transfers PT goals addressed during session: Mobility/safety with mobility;Balance;Strengthening/ROM        AM-PAC PT "6 Clicks" Mobility   Outcome Measure  Help needed turning from your back to your side while in a flat bed without using bedrails?: Total Help needed moving from lying on your back to sitting on the side of a flat bed without using bedrails?: Total Help needed moving to and from a bed to a chair (including a wheelchair)?: Total Help needed standing up from a chair using your arms (e.g., wheelchair or bedside chair)?: Total Help needed to walk in hospital room?: Total Help needed climbing 3-5 steps with a railing? : Total 6 Click Score: 6    End of Session Equipment Utilized During Treatment:  (vent) Activity Tolerance: Patient tolerated treatment well Patient left: in bed;with call bell/phone  within reach;with bed alarm set;with family/visitor present Nurse Communication: Need for lift equipment PT Visit Diagnosis: Hemiplegia and hemiparesis Hemiplegia - Right/Left: Left Hemiplegia - dominant/non-dominant: Non-dominant Hemiplegia - caused by: Nontraumatic intracerebral hemorrhage     Time: 1025-1051 PT Time Calculation (min) (ACUTE ONLY): 26 min  Charges:  $Therapeutic Activity: 8-22 mins                     Rolm Baptise, PT, DPT   Acute Rehabilitation Department Pager #: [phone]   Gaetana Michaelis 06/25/2020, 12:07 PM

## 2020-06-26 ENCOUNTER — Inpatient Hospital Stay (HOSPITAL_COMMUNITY): Payer: Medicare Other

## 2020-06-26 DIAGNOSIS — R509 Fever, unspecified: Secondary | ICD-10-CM | POA: Diagnosis not present

## 2020-06-26 DIAGNOSIS — R0902 Hypoxemia: Secondary | ICD-10-CM | POA: Diagnosis not present

## 2020-06-26 DIAGNOSIS — J690 Pneumonitis due to inhalation of food and vomit: Secondary | ICD-10-CM | POA: Diagnosis not present

## 2020-06-26 DIAGNOSIS — R531 Weakness: Secondary | ICD-10-CM | POA: Diagnosis not present

## 2020-06-26 DIAGNOSIS — I611 Nontraumatic intracerebral hemorrhage in hemisphere, cortical: Secondary | ICD-10-CM | POA: Diagnosis not present

## 2020-06-26 LAB — CBC WITH DIFFERENTIAL/PLATELET
Abs Immature Granulocytes: 0.19 10*3/uL — ABNORMAL HIGH (ref 0.00–0.07)
Basophils Absolute: 0 10*3/uL (ref 0.0–0.1)
Basophils Relative: 0 %
Eosinophils Absolute: 0.3 10*3/uL (ref 0.0–0.5)
Eosinophils Relative: 2 %
HCT: 33.6 % — ABNORMAL LOW (ref 39.0–52.0)
Hemoglobin: 10.5 g/dL — ABNORMAL LOW (ref 13.0–17.0)
Immature Granulocytes: 1 %
Lymphocytes Relative: 10 %
Lymphs Abs: 1.9 10*3/uL (ref 0.7–4.0)
MCH: 31.8 pg (ref 26.0–34.0)
MCHC: 31.3 g/dL (ref 30.0–36.0)
MCV: 101.8 fL — ABNORMAL HIGH (ref 80.0–100.0)
Monocytes Absolute: 1.4 10*3/uL — ABNORMAL HIGH (ref 0.1–1.0)
Monocytes Relative: 8 %
Neutro Abs: 14.6 10*3/uL — ABNORMAL HIGH (ref 1.7–7.7)
Neutrophils Relative %: 79 %
Platelets: 219 10*3/uL (ref 150–400)
RBC: 3.3 MIL/uL — ABNORMAL LOW (ref 4.22–5.81)
RDW: 12.9 % (ref 11.5–15.5)
WBC: 18.4 10*3/uL — ABNORMAL HIGH (ref 4.0–10.5)
nRBC: 0 % (ref 0.0–0.2)

## 2020-06-26 LAB — BASIC METABOLIC PANEL
Anion gap: 8 (ref 5–15)
BUN: 29 mg/dL — ABNORMAL HIGH (ref 8–23)
CO2: 23 mmol/L (ref 22–32)
Calcium: 8 mg/dL — ABNORMAL LOW (ref 8.9–10.3)
Chloride: 115 mmol/L — ABNORMAL HIGH (ref 98–111)
Creatinine, Ser: 1.05 mg/dL (ref 0.61–1.24)
GFR, Estimated: >60 mL/min (ref >60)
Glucose, Bld: 148 mg/dL — ABNORMAL HIGH (ref 70–99)
Potassium: 3.9 mmol/L (ref 3.5–5.1)
Sodium: 146 mmol/L — ABNORMAL HIGH (ref 135–145)

## 2020-06-26 LAB — GLUCOSE, CAPILLARY
Glucose-Capillary: 111 mg/dL — ABNORMAL HIGH (ref 70–99)
Glucose-Capillary: 118 mg/dL — ABNORMAL HIGH (ref 70–99)
Glucose-Capillary: 129 mg/dL — ABNORMAL HIGH (ref 70–99)
Glucose-Capillary: 132 mg/dL — ABNORMAL HIGH (ref 70–99)
Glucose-Capillary: 140 mg/dL — ABNORMAL HIGH (ref 70–99)
Glucose-Capillary: 149 mg/dL — ABNORMAL HIGH (ref 70–99)

## 2020-06-26 MED ORDER — LEVETIRACETAM IN NACL 500 MG/100ML IV SOLN: 500.0000 mg | Freq: Two times a day (BID) | INTRAVENOUS | Status: DC

## 2020-06-26 MED ORDER — SODIUM CHLORIDE 0.9 % IV SOLN
2000.0000 mg | INTRAVENOUS | Status: DC
  Filled 2020-06-26: qty 20

## 2020-06-26 NOTE — Progress Notes (Signed)
STROKE TEAM PROGRESS NOTE   INTERVAL HISTORY RN is at the bedside. Pt still intubated on no precedex, eyes open on voice but a little more lethargic than yesterday. No fever today, still following simple commands on the right. Still has left hemiplegia. Na 149today, on Zosyn. Tolerated weaning for 12h yesterday and extubated. tmax 100 last 24h. WBC trending up - today 18.4  In the evening on 3/8 - also concern for rhythmic twitching of the right thigh.  Vitals:   06/26/20 0600 06/26/20 0700 06/26/20 0800 06/26/20 0816  BP: (!) 150/64 (!) 136/53 (!) 70/60 134/77  Pulse: 85 80 76 81  Resp: (!) 25 (!) 25 (!) 25   Temp:   98.3 F (36.8 C)   TempSrc:   Oral   SpO2: 98% 95% 96%   Weight:      Height:       CBC:  Recent Labs  Lab 06/25/20 0754 06/26/20 0805  WBC 15.1* 18.4*  NEUTROABS 11.2* 14.6*  HGB 10.7* 10.5*  HCT 34.5* 33.6*  MCV 105.2* 101.8*  PLT 190 219   Basic Metabolic Panel:  Recent Labs  Lab 06/20/20 0433 06/20/20 1035 06/21/20 0422 06/21/20 0952 06/24/20 0146 06/25/20 0754  NA 152*   < > 155*   < > 155* 149*  K 4.3  --   --    < > 4.3 3.9  CL 121*  --   --    < > 124* 118*  CO2 24  --   --    < > 25 23  GLUCOSE 155*  --   --    < > 139* 176*  BUN 25*  --   --    < > 36* 32*  CREATININE 0.72  --   --    < > 1.08 1.05  CALCIUM 8.1*  --   --    < > 8.0* 7.8*  MG 2.6*  --   --   --   --   --   PHOS 2.2*  --  3.2  --   --   --    < > = values in this interval not displayed.   Lipid Panel:  Recent Labs  Lab 06/23/20 0506  TRIG 118   HgbA1c:  No results for input(s): HGBA1C in the last 168 hours. Urine Drug Screen:  No results for input(s): LABOPIA, COCAINSCRNUR, LABBENZ, AMPHETMU, THCU, LABBARB in the last 168 hours.  Alcohol Level No results for input(s): ETH in the last 168 hours.  IMAGING past 24 hours ECHOCARDIOGRAM COMPLETE  Result Date: 06/17/2020 IMPRESSIONS   1. Left ventricular ejection fraction, by estimation, is 70 to 75%. The left  ventricle has hyperdynamic function. The left ventricle has no regional wall motion abnormalities. Left ventricular diastolic parameters are consistent with Grade I diastolic dysfunction (impaired relaxation).   2. Right ventricular systolic function is normal. The right ventricular size is normal. Tricuspid regurgitation signal is inadequate for assessing PA pressure.   3. The mitral valve is normal in structure. Trivial mitral valve regurgitation. No evidence of mitral stenosis.   4. The aortic valve is tricuspid. Aortic valve regurgitation is not visualized. Mild aortic valve sclerosis is present, with no evidence of aortic valve stenosis.   5. The inferior vena cava is normal in size with greater than 50% respiratory variability, suggesting right atrial pressure of 3 mmHg.    IMAGING past 24 hours MR BRAIN W WO CONTRAST  Result Date: 06/15/2020 CLINICAL DATA:  IMPRESSION: 1. Innumerable foci of hemosiderin deposition scattered throughout the brain consistent with previous hemorrhagic infarctions. Intraparenchymal hematoma at the right frontoparietal junction with internal clot retraction, measuring 7.8 x 4.7 x 4.6 cm. No abnormal enhancement to suggest that this represents a hemorrhagic mass. Small hemorrhage in the left temporal lobe cannot be specifically identified by MRI as different than the other foci of hemosiderin deposition. 2. Small amount of subarachnoid hemorrhage within the sulci. 3. Mild mass-effect upon the right lateral ventricle with right-to-left shift 3 mm. Chronic small-vessel ischemic changes of the white matter elsewhere. The overall pattern could either be due to amyloid angiopathy or hypertensive vascular disease. 4. No finding to suggest the presence metastatic disease. Electronically Signed   By: Paulina Fusi M.D.   On: 06/15/2020 22:56   DG CHEST PORT 1 VIEW  Result Date: 06/15/2020 CLINICAL DATA:  Unresponsive. EXAM: PORTABLE CHEST 1 VIEW COMPARISON:  None.  FINDINGS: The heart size and mediastinal contours are within normal limits. Both lungs are clear. The visualized skeletal structures are unremarkable. IMPRESSION: No active disease. Electronically Signed   By: Lupita Raider M.D.   On: 06/15/2020 20:29   CT HEAD CODE STROKE WO CONTRAST  Result Date: 06/15/2020 IMPRESSION:  1. Right frontoparietal parenchymal hemorrhage measuring 7.6 cm. Sub-5 mm left temporal hemorrhage.  2. No significant midline shift or ventricular entrapment.  3. Right greater than left cerebral convexity subarachnoid blood products.  4. ASPECTS is 10.   PHYSICAL EXAM  Temp:  [98.1 F (36.7 C)-100.1 F (37.8 C)] 98.3 F (36.8 C) (03/08 0800) Pulse Rate:  [69-91] 81 (03/08 0816) Resp:  [18-27] 25 (03/08 0800) BP: (70-164)/(48-77) 134/77 (03/08 0816) SpO2:  [95 %-100 %] 96 % (03/08 0800)  General - Well nourished, well developed, intubated on low dose precedex.  Ophthalmologic - fundi not visualized due to noncooperation.  Cardiovascular - Regular rate and rhythm.  Neuro -  Extubated yesterday No sedation Opens eyes to voice Follow some commands on the right Pupils equal round reactive light, blinks to threat from the right but not from the left. Face with left lower facial weakness. Right upper extremity strength at least 4/5.  Right lower extremity strength definitely antigravity 3/5. Left upper and lower extremity with minimal to no movement of the upper extremity and mild withdrawal of the lower extremity. Upgoing toe on the left.   ASSESSMENT/PLAN  86 year old male with very minimal past medical history. Ate dinner with his wife at 1730 and having a conversationtill 1800 hrs. Abruptly at 1815 was found to be down. EMS on arrival found his blood pressure to be high systolic 200s with slurred speech and left sided weakness. He had an episode of emesis on route and was transiently hypoxemic.  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   CTA head & neck stable ICH, Mild to moderate right M2, right ICA terminus, distal basilar artery and bilateral PCA narrowing.  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 The overall pattern could either be due to amyloid angiopathy or hypertensive vascular disease.  No antiplatelets or anticoagulation.  Long term BP goal < 140   Hypertension  Home meds: none, no hx of HTN but with BP 200s on EMS arrival  Currently on Norvasc 10mg  daily, coreg 25mg  bid  off cleviprex infusion  Labetalol 20mg  q2hr prn, hydralazine 20mg  q6hr prn  Long-term BP goal < 140 due to CAA  Hyperlipidemia  Home meds:  None   LDL 103, goal < 70  No statin at this time due to ICH and concern of CAA  Dysphagia  Secondary to stroke  SLP following  Cortrak in place  On Tube feeds and FW  Aspiration Pneumonia  Vomited in route w/EMS  Febrile: Tmax 101.2->afebile  Leukocytosis: WBC 19.7 -> 21.2 ->12.2->11.4->13.8-->15.1-->18.1  CXR bibasilar opacities  Continue antibiotics per CCM.    Blood cultures (3/6): pending  Sputum culture (2/28): Negative   Respiratory failure  Likely related to  aspiration-resolved.  CCM had a conversation with family regarding reintubation-they need some time to think about goals of care.  We will continue full scope of care for now.  Other Stroke Risk Factors  Advanced Age >/= 67    Appreciate PCCM assistance Discussed with Dr. [facility] day # 11  This patient is critically ill due to large right ICH and mass effect, probable CAA, respiratory failure, aspiration, fever and at significant risk of neurological worsening, death form seizure, hematoma expansion, brain herniation, sepsis, heart failure. This patient's care requires constant monitoring of vital signs, hemodynamics, respiratory and cardiac monitoring, review of multiple databases, neurological assessment, discussion with family, other specialists and medical decision making of high complexity. I spent 31 minutes of neurocritical care time in the care of this patient.  -- , MD Neurologist [facility] Pager: [phone]      To contact Stroke Continuity provider, please refer to 76. After hours, contact General Neurology

## 2020-06-26 NOTE — Progress Notes (Signed)
Stat  EEG complete - results pending.  

## 2020-06-26 NOTE — Progress Notes (Signed)
LTM EEG hooked up and running - no initial skin breakdown - push button tested - neuro notified. [facility] monitored 

## 2020-06-26 NOTE — Procedures (Addendum)
Patient Name: Calvin Yates  MRN: [id number]  Epilepsy Attending: Charlsie Quest  Referring Physician/Provider: Dr. Milon Dikes Date: 06/26/2020 Duration: 25.31 mins  Patient history: 86 year old male with right MCA infarct.  EEG to evaluate for seizures.  Level of alertness: Awake  AEDs during EEG study: None  Technical aspects: This EEG study was done with scalp electrodes positioned according to the 10-20 International system of electrode placement. Electrical activity was acquired at a sampling rate of 500Hz  and reviewed with a high frequency filter of 70Hz  and a low frequency filter of 1Hz . EEG data were recorded continuously and digitally stored.   Description: The posterior dominant rhythm consists of 8 Hz activity of moderate voltage (25-35 uV) seen predominantly in posterior head regions, symmetric and reactive to eye opening and eye closing.  EEG showed continuous 3 to 6 Hz theta-delta slowing in right frontotemporal region. Hyperventilation and photic stimulation were not performed.     ABNORMALITY -Continuous slow, right frontotemporal region  IMPRESSION: This study is suggestive of cortical dysfunction in right frontotemporal region likely secondary to underlying stroke.  No seizures or epileptiform discharges were seen throughout the recording.  Shaena Parkerson 

## 2020-06-26 NOTE — Progress Notes (Signed)
Physical Therapy Treatment Patient Details Name: Calvin Yates MRN: [id number] DOB: [birth year] Today's Date: 06/26/2020    History of Present Illness The pt is an 86 yo male presenting with elevated BP and tachycardia after being found down by his wife. 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    PT Comments    Pt very lethargic on arrival, slow to awaken, but finally could cue non-verbally that he understood commands.  Emphasis on transition to EOB, sitting balance, sit to stand, squat pivot transfers and following commands.    Follow Up Recommendations  CIR     Equipment Recommendations  Other (comment) (TBA)    Recommendations for Other Services Rehab consult     Precautions / Restrictions Precautions Precautions: Fall    Mobility  Bed Mobility Overal bed mobility: Needs Assistance Bed Mobility: Supine to Sit     Supine to sit: Max assist;+2 for safety/equipment     General bed mobility comments: Pt initially lethargic and slow to respond.  Once assist initiate, pt tried to come up, but still needed significant assist.    Transfers Overall transfer level: Needs assistance   Transfers: Sit to/from [employer] Transfers Sit to Stand: Total assist;+2 physical assistance (x2)   Squat pivot transfers: Total assist;+2 safety/equipment     General transfer comment: Cues for hand placement, 2 person assist with pad to facility hip/pelvic extension, control at knees and submaximal upright stance.  Ambulation/Gait                 Stairs             Wheelchair Mobility    Modified Rankin (Stroke Patients Only) Modified Rankin (Stroke Patients Only) Pre-Morbid Rankin Score: No symptoms Modified Rankin: Severe disability     Balance Overall balance assessment: Needs assistance Sitting-balance support: Feet supported;Single extremity supported Sitting balance-Leahy Scale: Poor Sitting balance  - Comments: pt needing consistent external assist to keep from listing L and to assist upright sitting posture.   Standing balance support: Single extremity supported;Bilateral upper extremity supported Standing balance-Leahy Scale: Zero Standing balance comment: Worked on attaining upright stance with 2 person assist, facilitation of upright stance and pelvic extension with help of padding  x2                            Cognition Arousal/Alertness: Lethargic Behavior During Therapy: Flat affect Overall Cognitive Status: Impaired/Different from baseline                     Current Attention Level: Focused;Sustained   Following Commands: Follows one step commands with increased time   Awareness: Intellectual Problem Solving: Slow processing;Decreased initiation;Difficulty sequencing;Requires verbal cues;Requires tactile cues        Exercises Other Exercises Other Exercises: hip/knee flexion extention ROM A/resisted on the Right and PROM on the L LE    General Comments General comments (skin integrity, edema, etc.): vss overall      Pertinent Vitals/Pain Faces Pain Scale: No hurt Pain Intervention(s): Monitored during session    Home Living                      Prior Function            PT Goals (current goals can now be found in the care plan section) Acute Rehab PT Goals PT Goal Formulation: Patient unable to participate in goal setting  Time For Goal Achievement: 06/30/20 Potential to Achieve Goals: Fair Progress towards PT goals: Progressing toward goals    Frequency    Min 4X/week      PT Plan Current plan remains appropriate    Co-evaluation              AM-PAC PT "6 Clicks" Mobility   Outcome Measure  Help needed turning from your back to your side while in a flat bed without using bedrails?: A Lot Help needed moving from lying on your back to sitting on the side of a flat bed without using bedrails?: A Lot Help  needed moving to and from a bed to a chair (including a wheelchair)?: Total Help needed standing up from a chair using your arms (e.g., wheelchair or bedside chair)?: Total Help needed to walk in hospital room?: Total Help needed climbing 3-5 steps with a railing? : Total 6 Click Score: 8    End of Session Equipment Utilized During Treatment: Oxygen Activity Tolerance: Patient tolerated treatment well Patient left: in bed;with call bell/phone within reach;with bed alarm set;with family/visitor present Nurse Communication: Need for lift equipment PT Visit Diagnosis: Hemiplegia and hemiparesis Hemiplegia - Right/Left: Left Hemiplegia - dominant/non-dominant: Non-dominant Hemiplegia - caused by: Nontraumatic intracerebral hemorrhage     Time: 1310-1352 PT Time Calculation (min) (ACUTE ONLY): 42 min  Charges:  $Therapeutic Activity: 23-37 mins $Neuromuscular Re-education: 8-22 mins                     06/26/2020  Jacinto Halim., PT Acute Rehabilitation Services [phone]  (pager) [phone]  (office)   Calvin Yates 06/26/2020, 4:00 PM

## 2020-06-26 NOTE — Progress Notes (Signed)
Stat EEG with no change from p[rior but will leave on LTM overnight to capture any ictal-interictal discharges or other abnormalities that might be missed on a short EEG.   -- Milon Dikes, MD Stroke Neurology Pager: [phone]

## 2020-06-26 NOTE — Progress Notes (Addendum)
NAME:  Mia Winthrop, MRN:  [id number], DOB:  11-Jan-1935, LOS: 11 ADMISSION DATE:  06/15/2020, CONSULTATION DATE:  06/15/20 REFERRING MD:  Dr Iver Nestle - Neuro, CHIEF COMPLAINT: Acute encephalopathy due to ICH  Brief History:  86 year old male, LKW 2/25 1800 at dinner with wife, found down 1815 with SBP > 200, found to have 7cm R frontoparietal ICH without midline shift, L-sided deficits. PCCM consulted for worsening encephalopathy/concern for respiratory failure.  Significant Hospital Events:  2/25 - Admit 3/03 - Intubated 3/07 - Extubated to 4LNC  Consults:  PCCM  Procedures:  RUE PICC 3/1 >> ETT 3/3 >> 3/7  Significant Diagnostic Tests:   CT HEAD 2/25 > Right frontoparietal parenchymal hemorrhage measuring 7.6 cm. Sub-5 mm left temporal hemorrhage. No significant midline shift or ventricular entrapment. Right greater than left cerebral convexity subarachnoid blood products.  CXR 06/18/2020 Interval placement of feeding tube that extends below the diaphragm into the stomach. New bibasilar opacities may be consistent with atelectasis but could also be consistent with aspiration pneumonia given clinical history. No overt edema, pleural fluid or pneumothorax.  CT Head wo contrast 06/19/2020 Slight enlargement in the parenchymal hemorrhage in the right frontal parietal region as described. Minimal midline shift from right to left is noted. Stable small parenchymal hemorrhage in the left temporal lobe unchanged from the prior exam.   Micro Data:  RVP panel 2/25 >> Negative  MRSA PCR 2/25 >> Negative  Respiratory Cx 2/28 >> Normal flora  Blood Cx 2/28 >> negative   Antimicrobials:  Rocephin 2/28 >> 3/4 Zosyn 3/6 >>  Interim History / Subjective:  More alert/interactive today, remains slow to respond Denies pain at present, denies CP/SOB Tmax 100.98F, WBC continues to rise 18.6 (15.1) Extubated yesterday, satting well with [place] 1L  Objective   Blood pressure 134/77, pulse 81,  temperature 98.3 F (36.8 C), temperature source Oral, resp. rate (!) 25, height 5\' 8"  (1.727 m), weight 81.2 kg, SpO2 96 %.        Intake/Output Summary (Last 24 hours) at 06/26/2020 0907 Last data filed at 06/26/2020 0800 Gross per 24 hour  Intake 448.62 ml  Output 1975 ml  Net -1526.38 ml   Filed Weights   06/22/20 0500 06/23/20 0500 06/24/20 0412  Weight: 78.4 kg 84.1 kg 81.2 kg   Physical Examination: General: Acutely ill-appearing man, lying in bed, in NAD. HEENT: Anicteric sclera, PERRL, moist mucous membranes, Cortrax in place. Neuro: Awake, slow to respond but mouths answers to questions appropriately. Opens eyes to verbal stimuli and tracks appropriately. Following commands; ongoing L-sided neglect but does withdraw to pain in LLE. CV: S1S2, RRR, no m/g/r. PULM: Breathing even and unlabored on 1LNC. Lung fields with scattered rhonchi bilaterally, diminished at bases. GI: Soft, nontender, nondistended. Extremities: Trace LLE edema noted; mild LUE dependent edema.  Skin: Warm/dry, no rashes.  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 - Appreciate Neuro assistance with management - Continue FWF Q6H to allow for slow Na correction - Goal SBP < 160 - Neuroprotective measures: HOB > 30 degrees, normoglycemia, electrolytes WNL - Seizure precautions  Acute hypoxic respiratory failure requiring MV Aspiration pneumonia  Per report, vomited en route to hospital. Respiratory  culture with normal respiratory flora. Required urgent intubation overnight 3/4. Mental status improved and patient was successfully extubated 3/7. -  Continue supplemental O2 - Weaning for O2 sat > 90% - Pulmonary hygiene with guaifenesin BID - F/u CXR 3/8  Pyrexia, leukocytosis; no obvious source of infection. S/p 5-day course of ceftriaxone with continued leukocytosis/fever, continuing to uptrend - Trend CBC - Continue Zosyn (end date TBD) - F/u repeat respiratory Cx  Hypertensive emergency associated with intracranial hemorrhage> now controlled SBP on EMS arrival reportedly > 200. Echo 2/27 LVEF 70 to 75% and grade 1 diastolic dysfunction. - Continue amlodipine - Continue Coreg - Hydralazine/Labetalol PRN for goal SBP < 160  Hypergylcemia - controlled, has not been requiring insulin. - SSI - Goal CBG 140-180  Acute anemia, likely due to acute illness - Transfuse for Hgb < 7 - Trend CBC - Monitor for active bleeding  Physical deconditioning - Continue PT/OT/SLP  Best practice (evaluated daily)  Diet: TFs Pain/Anxiety/Delirium protocol (if indicated): N/A VAP protocol (if indicated): N/A DVT prophylaxis: Lovenox GI prophylaxis: PPI Glucose control: SSI Mobility: Bedrest Disposition: Neuro ICU  Goals of Care:   Family Updates: Per primary  Critical care time: 62 minutes   [name], PA-C [facility] 06/26/20 9:07 AM  Please see [email or web address] for pager details.

## 2020-06-26 NOTE — Progress Notes (Deleted)
STAT EEG with brief ictal-interictal discharges Will leave him on LTM Will load with Keppra. Will follow.  -- Milon Dikes, MD Stroke Neurology Pager: [phone]

## 2020-06-27 DIAGNOSIS — I619 Nontraumatic intracerebral hemorrhage, unspecified: Secondary | ICD-10-CM | POA: Diagnosis not present

## 2020-06-27 DIAGNOSIS — I611 Nontraumatic intracerebral hemorrhage in hemisphere, cortical: Secondary | ICD-10-CM | POA: Diagnosis not present

## 2020-06-27 DIAGNOSIS — T17908A Unspecified foreign body in respiratory tract, part unspecified causing other injury, initial encounter: Secondary | ICD-10-CM | POA: Diagnosis not present

## 2020-06-27 DIAGNOSIS — R069 Unspecified abnormalities of breathing: Secondary | ICD-10-CM | POA: Diagnosis not present

## 2020-06-27 LAB — BASIC METABOLIC PANEL
Anion gap: 9 (ref 5–15)
BUN: 29 mg/dL — ABNORMAL HIGH (ref 8–23)
CO2: 22 mmol/L (ref 22–32)
Calcium: 8.1 mg/dL — ABNORMAL LOW (ref 8.9–10.3)
Chloride: 115 mmol/L — ABNORMAL HIGH (ref 98–111)
Creatinine, Ser: 0.98 mg/dL (ref 0.61–1.24)
GFR, Estimated: >60 mL/min (ref >60)
Glucose, Bld: 134 mg/dL — ABNORMAL HIGH (ref 70–99)
Potassium: 4 mmol/L (ref 3.5–5.1)
Sodium: 146 mmol/L — ABNORMAL HIGH (ref 135–145)

## 2020-06-27 LAB — CBC
HCT: 34.7 % — ABNORMAL LOW (ref 39.0–52.0)
Hemoglobin: 11.1 g/dL — ABNORMAL LOW (ref 13.0–17.0)
MCH: 32.3 pg (ref 26.0–34.0)
MCHC: 32 g/dL (ref 30.0–36.0)
MCV: 100.9 fL — ABNORMAL HIGH (ref 80.0–100.0)
Platelets: 220 10*3/uL (ref 150–400)
RBC: 3.44 MIL/uL — ABNORMAL LOW (ref 4.22–5.81)
RDW: 12.9 % (ref 11.5–15.5)
WBC: 19.3 10*3/uL — ABNORMAL HIGH (ref 4.0–10.5)
nRBC: 0 % (ref 0.0–0.2)

## 2020-06-27 LAB — GLUCOSE, CAPILLARY
Glucose-Capillary: 109 mg/dL — ABNORMAL HIGH (ref 70–99)
Glucose-Capillary: 123 mg/dL — ABNORMAL HIGH (ref 70–99)
Glucose-Capillary: 124 mg/dL — ABNORMAL HIGH (ref 70–99)
Glucose-Capillary: 128 mg/dL — ABNORMAL HIGH (ref 70–99)
Glucose-Capillary: 135 mg/dL — ABNORMAL HIGH (ref 70–99)
Glucose-Capillary: 142 mg/dL — ABNORMAL HIGH (ref 70–99)

## 2020-06-27 LAB — PHOSPHORUS: Phosphorus: 3.4 mg/dL (ref 2.5–4.6)

## 2020-06-27 LAB — MAGNESIUM: Magnesium: 2.4 mg/dL (ref 1.7–2.4)

## 2020-06-27 MED ORDER — LISINOPRIL 5 MG PO TABS
5.0000 mg | ORAL_TABLET | Freq: Every day | ORAL | Status: DC
  Administered 2020-06-28 – 2020-07-03 (×6): 5 mg
  Filled 2020-06-27 (×6): qty 1

## 2020-06-27 MED ORDER — LISINOPRIL 5 MG PO TABS
5.0000 mg | ORAL_TABLET | Freq: Every day | ORAL | Status: DC
  Filled 2020-06-27: qty 1

## 2020-06-27 MED ORDER — VANCOMYCIN HCL 1500 MG/300ML IV SOLN
1500.0000 mg | INTRAVENOUS | Status: DC
  Administered 2020-06-28 – 2020-07-03 (×6): 1500 mg via INTRAVENOUS
  Filled 2020-06-27 (×6): qty 300

## 2020-06-27 MED ORDER — VANCOMYCIN HCL 1750 MG/350ML IV SOLN
1750.0000 mg | Freq: Once | INTRAVENOUS | Status: AC
  Administered 2020-06-27: 1750 mg via INTRAVENOUS
  Filled 2020-06-27 (×3): qty 350

## 2020-06-27 NOTE — Progress Notes (Signed)
Nutrition Follow-up  DOCUMENTATION CODES:   Not applicable  INTERVENTION:   Continue tube feeding via Cortrak tube: Osmolite 1.5 at 55 ml/h (1320 ml per day) Prosource TF 45 ml BID  Provides 2060 kcal, 104 gm protein, 1003 ml free water daily  200 ml free water every 6 hours  Total free water: 1803 ml   NUTRITION DIAGNOSIS:   Inadequate oral intake related to inability to eat as evidenced by NPO status. Ongoing  GOAL:   Patient will meet greater than or equal to 90% of their needs Met with TF.   MONITOR:   TF tolerance  REASON FOR ASSESSMENT:   Consult Enteral/tube feeding initiation and management  ASSESSMENT:   Pt with no PMH admitted after being found down at home with multifocal ICHs with largest in the R MCA likely hypertensive.  Pt discussed during ICU rounds and with RN. Per RN pt remains out on left side with neglect. Therapies following, pt remains too sleepy for SLP.   2/26 failed swallow eval  2/28 cortrak placed; tip gastric  3/3 pt intubated overnight for acute respiratory failure 3/7 extubated  Medications reviewed and include: SSI, MVI with minerals, senokot-s  Precedex  Labs reviewed: Na 153 (hypertonic saline off currently) CBG's: 109-135   Diet Order:   Diet Order            Diet NPO time specified  Diet effective now                 EDUCATION NEEDS:   No education needs have been identified at this time  Skin:  Skin Assessment: Reviewed RN Assessment  Last BM:  none documented since admission  Height:   Ht Readings from Last 1 Encounters:  06/21/20 5' 8"  (1.727 m)    Weight:   Wt Readings from Last 1 Encounters:  06/24/20 81.2 kg    Ideal Body Weight:  70 kg  BMI:  Body mass index is 27.22 kg/m.  Estimated Nutritional Needs:   Kcal:  1900-2100  Protein:  100-115 grams  Fluid:  >1.9 L/day  Lockie Pares., RD, LDN, CNSC See AMiON for contact information

## 2020-06-27 NOTE — Progress Notes (Signed)
LTM discontinued; no skin breakdown was seen.

## 2020-06-27 NOTE — Progress Notes (Signed)
Physical Therapy Treatment Patient Details Name: Calvin Yates MRN: [id number] DOB: [birth year] Today's Date: 06/27/2020    History of Present Illness The pt is an 86 yo male presenting with elevated BP and tachycardia after being found down by his wife. 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    PT Comments    Patient received in bed. Lethargic. Able to mumble some answers to questions and squeeze hand on right when asked. Does not attend to left side. Requires max +2/total assist for all mobility this date. Requires assistance to sit edge of bed, positioning of L UE to help prop up. Total assist for pivot to recliner and position in recliner. Patient will continue to benefit from skilled PT while here to improve strength and functional independence.       Follow Up Recommendations  CIR;SNF     Equipment Recommendations  None recommended by PT    Recommendations for Other Services Rehab consult     Precautions / Restrictions Precautions Precautions: Fall Restrictions Weight Bearing Restrictions: No    Mobility  Bed Mobility Overal bed mobility: Needs Assistance Bed Mobility: Supine to Sit Rolling: Max assist;+2 for physical assistance;+2 for safety/equipment Sidelying to sit: Max assist;+2 for physical assistance;+2 for safety/equipment Supine to sit: Max assist;+2 for physical assistance;+2 for safety/equipment     General bed mobility comments: Pt initially lethargic and slow to respond.  requires significant assist, limited initiation.    Transfers Overall transfer level: Needs assistance Equipment used: 2 person hand held assist Transfers: Squat Pivot Transfers     Squat pivot transfers: Total assist;+2 physical assistance;+2 safety/equipment     General transfer comment: Cues for hand placement, 2 person assist. +2 assist to position in recliner. Unable to help much at all.  Ambulation/Gait              General Gait Details: unable   Stairs             Wheelchair Mobility    Modified Rankin (Stroke Patients Only) Modified Rankin (Stroke Patients Only) Pre-Morbid Rankin Score: No symptoms Modified Rankin: Severe disability     Balance Overall balance assessment: Needs assistance Sitting-balance support: Feet supported;Single extremity supported Sitting balance-Leahy Scale: Poor Sitting balance - Comments: pt needing consistent external assist to keep from listing L and to assist upright sitting posture. Postural control: Posterior lean Standing balance support: Single extremity supported Standing balance-Leahy Scale: Zero Standing balance comment: Patient requiring total assist +2 to pivot to recliner.                            Cognition Arousal/Alertness: Lethargic Behavior During Therapy: Flat affect Overall Cognitive Status: Impaired/Different from baseline Area of Impairment: Orientation;Problem solving;Following commands;Safety/judgement;Awareness;Attention                 Orientation Level: Disoriented to;Time;Situation;Place Current Attention Level: Focused   Following Commands: Follows one step commands with increased time;Follows one step commands inconsistently Safety/Judgement: Decreased awareness of safety;Decreased awareness of deficits Awareness: Intellectual Problem Solving: Slow processing;Decreased initiation;Difficulty sequencing;Requires verbal cues;Requires tactile cues General Comments: Requires increased time for all activities due to lethargy. Inattentive to left side. Eyes closed throughout most of session. Will open on command but will not keep them open.      Exercises      General Comments        Pertinent Vitals/Pain Pain Assessment: No/denies pain    Home  Living                      Prior Function            PT Goals (current goals can now be found in the care plan section) Acute Rehab PT  Goals PT Goal Formulation: Patient unable to participate in goal setting Time For Goal Achievement: 07/11/20 Progress towards PT goals: Not progressing toward goals - comment (patient limited by lethargy. Does not provide much assistance at all.)    Frequency    Min 4X/week      PT Plan Current plan remains appropriate    Co-evaluation              AM-PAC PT "6 Clicks" Mobility   Outcome Measure  Help needed turning from your back to your side while in a flat bed without using bedrails?: Total Help needed moving from lying on your back to sitting on the side of a flat bed without using bedrails?: Total Help needed moving to and from a bed to a chair (including a wheelchair)?: Total Help needed standing up from a chair using your arms (e.g., wheelchair or bedside chair)?: Total Help needed to walk in hospital room?: Total Help needed climbing 3-5 steps with a railing? : Total 6 Click Score: 6    End of Session   Activity Tolerance: Patient limited by lethargy Patient left: in chair;with chair alarm set;Other (comment) (lift pad under patient to assist with return to bed) Nurse Communication: Need for lift equipment PT Visit Diagnosis: Hemiplegia and hemiparesis;Other abnormalities of gait and mobility (R26.89);Muscle weakness (generalized) (M62.81) Hemiplegia - Right/Left: Left Hemiplegia - dominant/non-dominant: Non-dominant Hemiplegia - caused by: Nontraumatic intracerebral hemorrhage     Time: 5621-3086 PT Time Calculation (min) (ACUTE ONLY): 28 min  Charges:  $Therapeutic Activity: 23-37 mins                     Camrie Stock, PT, GCS 06/27/20,3:04 PM

## 2020-06-27 NOTE — Progress Notes (Signed)
NAME:  Calvin Yates, MRN:  [id number], DOB:  Jun 07, 1934, LOS: 12 ADMISSION DATE:  06/15/2020, CONSULTATION DATE:  06/15/20 REFERRING MD:  Dr Iver Nestle - Neuro, CHIEF COMPLAINT: Acute encephalopathy due to ICH  Brief History:  86 year old male, LKW 2/25 1800 at dinner with wife, found down 1815 with SBP > 200, found to have 7cm R frontoparietal ICH without midline shift, L-sided deficits. PCCM consulted for worsening encephalopathy/concern for respiratory failure.  Significant Hospital Events:  2/25 - Admit 3/03 - Intubated 3/07 - Extubated to [facility] 3/08 - ?Seizure activity, STAT EEG per Neuro (unchanged from prior), LTM  Consults:  PCCM  Procedures:  RUE PICC 3/1 >> ETT 3/3 >> 3/7  Significant Diagnostic Tests:   CT HEAD 2/25 > Right frontoparietal parenchymal hemorrhage measuring 7.6 cm. Sub-5 mm left temporal hemorrhage. No significant midline shift or ventricular entrapment. Right greater than left cerebral convexity subarachnoid blood products.  CXR 06/18/2020 Interval placement of feeding tube that extends below the diaphragm into the stomach. New bibasilar opacities may be consistent with atelectasis but could also be consistent with aspiration pneumonia given clinical history. No overt edema, pleural fluid or pneumothorax.  CT Head wo contrast 06/19/2020 Slight enlargement in the parenchymal hemorrhage in the right frontal parietal region as described. Minimal midline shift from right to left is noted. Stable small parenchymal hemorrhage in the left temporal lobe unchanged from the prior exam.   Micro Data:  RVP panel 2/25 >> Negative  MRSA PCR 2/25 >> Negative  Respiratory Cx 2/28 >> Normal flora  Blood Cx 2/28 >> negative   Antimicrobials:  Rocephin 2/28 >> 3/4 Zosyn 3/6 >>  Interim History / Subjective:  Overnight, c/f seizure activity/"twitching" STAT EEG unremarkable, LTM initiated Continues to be interactive with significant prompting, though slow to respond Denies  pain, CP/SOB, dyspnea TMax 99.35F/24H, WBC continues to rise 19.3 (18.6) Breathing comfortably on 1LNC  Objective   Blood pressure (!) 143/64, pulse 79, temperature 99.8 F (37.7 C), temperature source Axillary, resp. rate (!) 27, height 5\' 8"  (1.727 m), weight 81.2 kg, SpO2 100 %.        Intake/Output Summary (Last 24 hours) at 06/27/2020 0726 Last data filed at 06/27/2020 0600 Gross per 24 hour  Intake 1674.65 ml  Output 1875 ml  Net -200.35 ml   Filed Weights   06/22/20 0500 06/23/20 0500 06/24/20 0412  Weight: 78.4 kg 84.1 kg 81.2 kg   Physical Examination: General: Acutely ill-appearing elderly man, sitting up in bed, in NAD. HEENT: [place]/AT, anicteric sclera, PERRL, moist mucous membranes, Cortrak in place. Neuro: Awake, slow to respond but mouths answers/nods to questions appropriately. Opens eyes to verbal stimuli and tracks appropriately. Following commands with RUE/RLE, ongoing L-sided neglect, does withdraw to pain in LLE. CV: RRR, no m/g/r. PULM: Breathing even and unlabored on 1LNC. Lung fields diminished at bilateral bases with bilateral occasional rhonchi. GI: Soft, nontender, mildly distended. Slightly hypoactive bowel sounds. Extremities: Trace LLE edema noted, 1+ LUE edema noted, suspect dependent. Skin: Warm/dry, no rashes, LUE slightly more red compared to RUE.  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 - Appreciate neuro assistance with management - Continue FWF Q6H to allow for slow  Na correction - Goal SBP < 160 - Neuroprotective measures: HOB > 30 degrees, normoglycemia, normothermia, electrolytes WNL -  Seizure precautions  Possible focal R seizure activity Questionable focal RLE "twitching" noted 3/8PM. Neuro with recommendation for STAT EEG which was grossly unremarkable. - Continue LTM for now, per Neuro  Acute hypoxic respiratory failure requiring MV Aspiration pneumonia  Per report, vomited en route to hospital. Respiratory culture with normal respiratory flora. Required urgent intubation overnight 3/4. Mental status improved and patient was successfully extubated 3/7. CXR 3/8 stable to slightly improved. - Continue supplemental O2 - Wean O2 for sat > 90% - Pulmonary hygiene  Pyrexia, leukocytosis; no obvious source of infection. S/p 5-day course of ceftriaxone with continued leukocytosis/fever, continuing to uptrend - Trend CBC - Continue Zosyn - Add Vanc today 3/9, given rising WBC - F/u repeat respiratory Cx  Hypertensive emergency associated with intracranial hemorrhage> now controlled SBP on EMS arrival reportedly > 200. Echo 2/27 LVEF 70 to 75% and grade 1 diastolic dysfunction. - Continue amlodipine - Continue Coreg - Hydral/Labetelol PRN for goal SBP < 160  Hypergylcemia - controlled, has not been requiring insulin. - SSI - Goal CBG 140-180  Acute anemia, likely due to acute illness - Trend CBC - Transfuse for Hgb < 7 - Monitor for signs of active bleeding  Physical deconditioning - Continue PT/OT/SLP  Best practice (evaluated daily)  Diet: TFs Pain/Anxiety/Delirium protocol (if indicated): N/A VAP protocol (if indicated): N/A DVT prophylaxis: Lovenox GI prophylaxis: PPI Glucose control: SSI Mobility: Bedrest Disposition: Neuro ICU  Goals of Care:   Family Updates: Per primary; Dr. Merrily Pew briefly discussed code status with family at bedside 3/8 (daughter, wife). Expressed that patient is currently doing ok, but in the event he decompensated asked family to consider whether or not patient would want to be reintubated as this would likely lead to a  prolonged time on the ventilator and tracheostomy/PEG. Family wished to have some time to discuss.  Critical care time: 31 minutes   [name], [place] 06/27/20 7:26 AM  Please see [email or web address] for pager details.

## 2020-06-27 NOTE — Progress Notes (Signed)
STROKE TEAM PROGRESS NOTE   INTERVAL HISTORY Extubated. No sedation. Following commands. Concern for sz - EEG with rt sided dysfunction. Hooked up to LTM which showed no events of seizures.  Continues to show right frontotemporal dysfunction likely due to underlying bleed.   Vitals:   06/27/20 0600 06/27/20 0728 06/27/20 0800 06/27/20 0805  BP: (!) 143/64 (!) 153/60 (!) 145/58 (!) 145/58  Pulse: 79 82 78 80  Resp: (!) 27 (!) 23 (!) 24   Temp:      TempSrc:      SpO2: 100% 98% 97%   Weight:      Height:       CBC:  Recent Labs  Lab 06/25/20 0754 06/26/20 0805 06/27/20 0318  WBC 15.1* 18.4* 19.3*  NEUTROABS 11.2* 14.6*  --   HGB 10.7* 10.5* 11.1*  HCT 34.5* 33.6* 34.7*  MCV 105.2* 101.8* 100.9*  PLT 190 219 220   Basic Metabolic Panel:  Recent Labs  Lab 06/21/20 0422 06/21/20 0952 06/26/20 0805 06/27/20 0318  NA 155*   < > 146* 146*  K  --    < > 3.9 4.0  CL  --    < > 115* 115*  CO2  --    < > 23 22  GLUCOSE  --    < > 148* 134*  BUN  --    < > 29* 29*  CREATININE  --    < > 1.05 0.98  CALCIUM  --    < > 8.0* 8.1*  MG  --   --   --  2.4  PHOS 3.2  --   --  3.4   < > = values in this interval not displayed.   Lipid Panel:  Recent Labs  Lab 06/23/20 0506  TRIG 118   HgbA1c:  No results for input(s): HGBA1C in the last 168 hours. Urine Drug Screen:  No results for input(s): LABOPIA, COCAINSCRNUR, LABBENZ, AMPHETMU, THCU, LABBARB in the last 168 hours.  Alcohol Level No results for input(s): ETH in the last 168 hours.  IMAGING past 24 hours ECHOCARDIOGRAM COMPLETE  Result Date: 06/17/2020 IMPRESSIONS   1. Left ventricular ejection fraction, by estimation, is 70 to 75%. The left ventricle has hyperdynamic function. The left ventricle has no regional wall motion abnormalities. Left ventricular diastolic parameters are consistent with Grade I diastolic dysfunction (impaired relaxation).   2. Right ventricular systolic function is normal. The right  ventricular size is normal. Tricuspid regurgitation signal is inadequate for assessing PA pressure.   3. The mitral valve is normal in structure. Trivial mitral valve regurgitation. No evidence of mitral stenosis.   4. The aortic valve is tricuspid. Aortic valve regurgitation is not visualized. Mild aortic valve sclerosis is present, with no evidence of aortic valve stenosis.   5. The inferior vena cava is normal in size with greater than 50% respiratory variability, suggesting right atrial pressure of 3 mmHg.    IMAGING past 24 hours MR BRAIN W WO CONTRAST  Result Date: 06/15/2020 CLINICAL DATA:   IMPRESSION: 1. Innumerable foci of hemosiderin deposition scattered throughout the brain consistent with previous hemorrhagic infarctions. Intraparenchymal hematoma at the right frontoparietal junction with internal clot retraction, measuring 7.8 x 4.7 x 4.6 cm. No abnormal enhancement to suggest that this represents a hemorrhagic mass. Small hemorrhage in the left temporal lobe cannot be specifically identified by MRI as different than the other foci of hemosiderin deposition. 2. Small amount of subarachnoid hemorrhage within the sulci. 3.  Mild mass-effect upon the right lateral ventricle with right-to-left shift 3 mm. Chronic small-vessel ischemic changes of the white matter elsewhere. The overall pattern could either be due to amyloid angiopathy or hypertensive vascular disease. 4. No finding to suggest the presence metastatic disease. Electronically Signed   By: Paulina Fusi M.D.   On: 06/15/2020 22:56   DG CHEST PORT 1 VIEW  Result Date: 06/15/2020 CLINICAL DATA:  Unresponsive. EXAM: PORTABLE CHEST 1 VIEW COMPARISON:  None. FINDINGS: The heart size and mediastinal contours are within normal limits. Both lungs are clear. The visualized skeletal structures are unremarkable. IMPRESSION: No active disease. Electronically Signed   By: Lupita Raider M.D.   On: 06/15/2020 20:29   CT HEAD CODE STROKE WO  CONTRAST  Result Date: 06/15/2020 IMPRESSION:  1. Right frontoparietal parenchymal hemorrhage measuring 7.6 cm. Sub-5 mm left temporal hemorrhage.  2. No significant midline shift or ventricular entrapment.  3. Right greater than left cerebral convexity subarachnoid blood products.  4. ASPECTS is 10.   PHYSICAL EXAM  Temp:  [98.1 F (36.7 C)-99.8 F (37.7 C)] 99.8 F (37.7 C) (03/09 0400) Pulse Rate:  [73-84] 80 (03/09 0805) Resp:  [14-27] 24 (03/09 0800) BP: (130-153)/(48-64) 145/58 (03/09 0805) SpO2:  [95 %-100 %] 97 % (03/09 0800)  General - Well nourished, well developed Ophthalmologic - fundi not visualized due to noncooperation. Cardiovascular - Regular rate and rhythm. Resp: scattered rales, remains on supplemental O2 Abd: ND NT Neuro -  Extubated yesterday No sedation Appears to be sleeping Opens eyes to voice Follow commands on the right Right gaze preference, can come to midline but does  Not cross midline to left Pupils equal round reactive light, blinks to threat from the right but not from the left. Face with left lower facial weakness. Right upper extremity strength at least 4/5.  Right lower extremity strength definitely antigravity 3 or 4/5. LLE withdraws weakly to pain, LUE nox stim produces grimace but no withdrawal Upgoing toe on the left.   ASSESSMENT/PLAN  86 year old male with very minimal past medical history. Ate dinner with his wife at 1730 and having a conversationtill 1800 hrs. Abruptly at 1815 was found to be down. EMS on arrival found his blood pressure to be high systolic 200s with slurred speech and left sided weakness. He had an episode of emesis on route and was transiently hypoxemic.  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   CTA head & neck stable  ICH, Mild to moderate right M2, right ICA terminus, distal basilar artery and bilateral PCA narrowing.  MRI stable hematoma, no underlying hemorrhagic mets. Likely CAA vs hypertensive changes. Chronic hemosiderin deposits from prior ICHs .  2D Echo: EF 70-75%, Grade I diastolic dysfunction  LDL 103  HgbA1c 5.6  VTE prophylaxis - Lovenox 40mg  daily  No AC/AP prior to admission, now no AC/AP due to ICH and likely CAA  Therapy recommendations:  CIR  Disposition:  pending   Cerebral Edema  Was on HTS. Off 3% saline now  Na now 146  Allow Na gradually trending down with continuing FW flushes   Concern for seizure -LTM negative for seizures.  Do not see a need for antiepileptics.  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 The overall pattern could either be due to amyloid angiopathy or hypertensive vascular disease.  No antiplatelets or anticoagulation.  Long term BP goal < 140   Hypertension  Home meds: none, no hx of HTN but with BP 200s on EMS arrival  Currently on Norvasc 10mg  daily, coreg 25mg  bid - add Lisinopril 5mg  daily as he is still over SBP 140 at times.  off cleviprex infusion  Labetalol 20mg  q2hr prn, hydralazine 20mg  q6hr prn  Long-term BP goal < 140 due to CAA.   Hyperlipidemia  Home meds:  None   LDL 103, goal < 70  No statin at this time due to ICH and concern of CAA  Dysphagia  Secondary to stroke  SLP following  Cortrak in place  On Tube feeds and FW  Aspiration Pneumonia  Vomited in route w/EMS  Febrile: Tmax 101.2->afebile  Leukocytosis: WBC 19.7 -> 21.2 ->12.2->11.4->13.8-->15.1-->18.1-->19.3  CXR bibasilar opacities  Continue antibiotics per CCM.    Blood cultures (3/6): pending  Sputum culture (2/28):  Negative  Consider removal of the PICC line-discussed with CCM who will evaluate.    Respiratory failure  Likely related to aspiration-resolved.   CCM had a conversation with family regarding reintubation-they need some time to think about goals of care.  We will continue full scope of care for now. Seems to be holding well for now.  Other Stroke Risk Factors  Advanced Age >/= 67    Appreciate PCCM assistance Discussed with Dr. on the unit    Hospital day # 12  This patient is critically ill due to large right ICH and mass effect, probable CAA, respiratory failure, aspiration, fever and at significant risk of neurological worsening, death form seizure, hematoma expansion, brain herniation, sepsis, heart failure. This patient's care requires constant monitoring of vital signs, hemodynamics, respiratory and cardiac monitoring, review of multiple databases, neurological assessment, discussion with family, other specialists and medical decision making of high complexity. I spent 33 minutes of neurocritical care time in the care of this patient.  -- , MD Neurologist [facility] Pager: [phone]      To contact Stroke Continuity provider, please refer to 04-16-1972. After hours, contact General Neurology

## 2020-06-27 NOTE — Evaluation (Signed)
Speech Language Pathology Evaluation Patient Details Name: Calvin Yates MRN: [id number] DOB: 1935-04-17 Today's Date: 06/27/2020 Time: 1010-1017 SLP Time Calculation (min) (ACUTE ONLY): 7 min  Problem List:  Patient Active Problem List   Diagnosis Date Noted  . Aspiration into airway   . Intubation of airway performed without difficulty   . Respiratory abnormalities   . Fever   . Hypoxia   . ICH (intracerebral hemorrhage) (HCC) 06/15/2020   Past Medical History: No past medical history on file. Past Surgical History:  Past Surgical History:  Procedure Laterality Date  . APPENDECTOMY    . FINGER SURGERY     HPI:  Calvin Yates is a 86 y.o. male with no significant history presented with slurred speech and L-sided weakness. MRI reported right frontoparietal parenchymal hemorrhage and left temporal hemorrhage. BSE 2/26 rec NPO. Intubated 3/3-3/7 due to respiratory distress, ST signed off and reordered 3/9. 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   Assessment / Plan / Recommendation Clinical Impression  Lethargy was primary barrier to pt's ability to interact. He keeps eyes closed and awakened in 2-3 minute intervals. Followed simple commands, oriented to self and no response to place or situation. Accurate for 2/3 pertient responsive naming questions re: friend at bedside. His speech is significantly dysarthric. His prognosis appears good for ST goals and will continue to intervene on acute care and rec inpatient CIR.    SLP Assessment  SLP Recommendation/Assessment: Patient needs continued Speech Lanaguage Pathology Services SLP Visit Diagnosis: Dysarthria and anarthria (R47.1);Cognitive communication deficit (R41.841)    Follow Up Recommendations  Inpatient Rehab    Frequency and Duration min 2x/week  2 weeks      SLP Evaluation Cognition  Overall Cognitive Status: Impaired/Different from baseline Arousal/Alertness: Lethargic Orientation Level: Oriented to person (no  response to other ox questions-lethargy) Attention: Sustained Sustained Attention: Impaired Sustained Attention Impairment: Verbal basic;Functional basic Memory:  (TBA) Awareness: Impaired Awareness Impairment: Emergent impairment Problem Solving:  (assess further when more alert) Safety/Judgment: Impaired       Comprehension  Auditory Comprehension Overall Auditory Comprehension: Other (comment) (when awake- accurate with simple questions) Yes/No Questions: Not tested Commands:  (functional for simple when awake) Interfering Components: Attention EffectiveTechniques: Extra processing time Visual Recognition/Discrimination Discrimination: Not tested Reading Comprehension Reading Status:  (TBA)    Expression Expression Primary Mode of Expression: Verbal Verbal Expression Overall Verbal Expression: Other (comment) (very limited- dysarthric -word seemed accurate) Initiation: Impaired Level of Generative/Spontaneous Verbalization: Phrase Repetition:  (NT) Naming: Not tested Pragmatics: Impairment Impairments: Abnormal affect;Eye contact;Dysprosody Written Expression Dominant Hand: Right Written Expression:  (TBA)   Oral / Motor  Oral Motor/Sensory Function Overall Oral Motor/Sensory Function: Moderate impairment Facial ROM:  (did not attempt movements) Facial Symmetry: Abnormal symmetry left;Suspected CN VII (facial) dysfunction Facial Strength: Reduced left;Suspected CN VII (facial) dysfunction Lingual ROM: Other (Comment) (did not follow command-lethargic) Motor Speech Overall Motor Speech: Impaired Phonation: Low vocal intensity Resonance: Within functional limits Articulation: Impaired Level of Impairment: Word Intelligibility: Intelligibility reduced Word: 25-49% accurate Phrase: 0-24% accurate Motor Planning:  (will assess further)   GO                    [name] 06/27/2020, 10:55 AM  [name].Ed Presenter, broadcasting [phone] Office [phone]

## 2020-06-27 NOTE — Progress Notes (Signed)
Pharmacy Antibiotic Note  Calvin Yates is a 86 y.o. male admitted on 06/15/2020 with pneumonia.  Pharmacy has been consulted for vancomycin dosing with rising WBC. Patient is afebrile. Already on Zosyn since 3/6. MD planning to exchange vs. remove PICC today.  SCr 0.98 - relatively stable.  Plan: Vancomycin 1750mg  IV x 1; then 1500mg  IV q24h. Goal AUC 400-550. Expected AUC: 519 SCr used: 0.98 Continue Zosyn 3.375g IV q8h (4h infusion) Monitor clinical progress, c/s, renal function F/u de-escalation plan/LOT, vancomycin levels as indicated   Height: 5\' 8"  (172.7 cm) Weight: 81.2 kg (179 lb 0.2 oz) IBW/kg (Calculated) : 68.4  Temp (24hrs), Avg:98.9 F (37.2 C), Min:98.1 F (36.7 C), Max:99.8 F (37.7 C)  Recent Labs  Lab 06/23/20 0506 06/24/20 0146 06/25/20 0754 06/26/20 0805 06/27/20 0318  WBC 11.4* 13.8* 15.1* 18.4* 19.3*  CREATININE 0.63 1.08 1.05 1.05 0.98    Estimated Creatinine Clearance: 54.3 mL/min (by C-G formula based on SCr of 0.98 mg/dL).    Allergies  Allergen Reactions  . Amoxicillin Rash    08/25/20, PharmD, BCPS Please check AMION for all [facility] Pharm[facility] contact numbers Clinical Pharmacist 06/27/2020 10:20 AM

## 2020-06-27 NOTE — Evaluation (Addendum)
Clinical/Bedside Swallow Evaluation Patient Details  Name: Calvin Yates MRN: [id number] Date of Birth: 1934/04/26  Today's Date: 06/27/2020 Time: SLP Start Time (ACUTE ONLY): 1000 SLP Stop Time (ACUTE ONLY): 1009    Past Medical History: No past medical history on file. Past Surgical History:  Past Surgical History:  Procedure Laterality Date  . APPENDECTOMY    . FINGER SURGERY     HPI:  Calvin Yates is a 86 y.o. male with no significant history presented with slurred speech and L-sided weakness. MRI reported right frontoparietal parenchymal hemorrhage and left temporal hemorrhage. BSE 2/26 rec NPO. Intubated 3/3-3/7 due to respiratory distress, ST signed off and reordered 3/9.   Assessment / Plan / Recommendation Clinical Impression  Pt known to this therapist from treatment session prior to recent 4 day intubation. His oral cavity was mildly dry and much improved from significant gloody secretions last week. He has short wakeful periods where he answers questions (dysarthria) and follows simple commands. Volitional cough strong however vocal intensity is low with delayed intentional swallow of secretions. Little manipulation with ice chip, holding and falling from left side mouth without detectible swallow. Recommend staff continue cleaning and moistening oral cavity. Prognosis for food/liquid is good when able to maintain alert state and demonstrate abilities with consistencies. SLP Visit Diagnosis: Dysphagia, unspecified (R13.10)    Aspiration Risk  Moderate aspiration risk;Mild aspiration risk    Diet Recommendation NPO   Medication Administration: Via alternative means    Other  Recommendations Oral Care Recommendations: Oral care QID   Follow up Recommendations Inpatient Rehab      Frequency and Duration min 2x/week  2 weeks       Prognosis Prognosis for Safe Diet Advancement: Good      Swallow Study   General HPI: Calvin Yates is a 86 y.o. male with no  significant history presented with slurred speech and L-sided weakness. MRI reported right frontoparietal parenchymal hemorrhage and left temporal hemorrhage. BSE 2/26 rec NPO. Intubated 3/3-3/7 due to respiratory distress, ST signed off and reordered 3/9. Type of Study: Bedside Swallow Evaluation Previous Swallow Assessment:  (see HPI) Diet Prior to this Study: NPO;NG Tube Temperature Spikes Noted: Yes Respiratory Status: Nasal cannula History of Recent Intubation: Yes Length of Intubations (days): 4 days Date extubated: 06/25/20 Behavior/Cognition: Requires cueing;Lethargic/Drowsy Oral Cavity Assessment: Dry (mild) Oral Care Completed by SLP: Yes Oral Cavity - Dentition: Missing dentition Vision:  (keeps eyes closed) Self-Feeding Abilities: Total assist Patient Positioning: Upright in bed Baseline Vocal Quality: Low vocal intensity Volitional Cough: Strong Volitional Swallow: Able to elicit (with delay)    Oral/Motor/Sensory Function Overall Oral Motor/Sensory Function: Moderate impairment Facial ROM:  (did not attempt movements) Facial Symmetry: Abnormal symmetry left;Suspected CN VII (facial) dysfunction Facial Strength: Reduced left;Suspected CN VII (facial) dysfunction Lingual ROM: Other (Comment) (did not follow command-lethargic)   Ice Chips Ice chips: Impaired Presentation: Spoon Oral Phase Impairments: Poor awareness of bolus;Reduced labial seal Oral Phase Functional Implications: Oral holding Pharyngeal Phase Impairments:  (no swallow observed)   Thin Liquid Thin Liquid: Not tested    Nectar Thick Nectar Thick Liquid: Not tested   Honey Thick Honey Thick Liquid: Not tested   Puree Puree: Not tested   Solid     Solid: Not tested      Calvin Yates 06/27/2020,10:41 AM    Calvin Yates Calvin Yates.Ed Nurse, children's [phone] Office [phone]

## 2020-06-27 NOTE — Procedures (Addendum)
Patient Name:Isreal Grainger Mccarley MEQ:[id number] Epilepsy Attending:Cyndra Feinberg Annabelle Harman Referring Physician/Provider:Dr. Milon Dikes Duration:06/26/2020 1927 to 06/27/2020 1020  Patient history:86 year old male with right MCA infarct. EEG to evaluate for seizures.  Level of alertness:Awake, asleep  AEDs during EEG study:None  Technical aspects: This EEG study was done with scalp electrodes positioned according to the 10-20 International system of electrode placement. Electrical activity was acquired at a sampling rate of 500Hz  and reviewed with a high frequency filter of 70Hz  and a low frequency filter of 1Hz . EEG data were recorded continuously and digitally stored.   Description: The posterior dominant rhythm consists of8Hz  activity of moderate voltage (25-35 uV) seen predominantly in posterior head regions, symmetric and reactive to eye opening and eye closing.  Sleep was characterized by sleep spindles (12 to 14 Hz), maximal frontocentral region.  EEG showed continuous 3 to 6 Hz theta-delta slowing in right frontotemporal region.  ABNORMALITY -Continuous slow, right frontotemporal region  IMPRESSION: This study issuggestive of cortical dysfunction in right frontotemporal region likely secondary to underlying stroke.No seizures or epileptiform discharges were seen throughout the recording.  Solange Emry 

## 2020-06-28 ENCOUNTER — Inpatient Hospital Stay (HOSPITAL_COMMUNITY): Payer: Medicare Other

## 2020-06-28 DIAGNOSIS — R609 Edema, unspecified: Secondary | ICD-10-CM

## 2020-06-28 DIAGNOSIS — I611 Nontraumatic intracerebral hemorrhage in hemisphere, cortical: Secondary | ICD-10-CM | POA: Diagnosis not present

## 2020-06-28 DIAGNOSIS — T17908A Unspecified foreign body in respiratory tract, part unspecified causing other injury, initial encounter: Secondary | ICD-10-CM | POA: Diagnosis not present

## 2020-06-28 DIAGNOSIS — M7989 Other specified soft tissue disorders: Secondary | ICD-10-CM

## 2020-06-28 LAB — BLOOD CULTURE ID PANEL (REFLEXED) - BCID2

## 2020-06-28 LAB — CBC
HCT: 32.6 % — ABNORMAL LOW (ref 39.0–52.0)
Hemoglobin: 10.4 g/dL — ABNORMAL LOW (ref 13.0–17.0)
MCH: 32.4 pg (ref 26.0–34.0)
MCHC: 31.9 g/dL (ref 30.0–36.0)
MCV: 101.6 fL — ABNORMAL HIGH (ref 80.0–100.0)
Platelets: 216 10*3/uL (ref 150–400)
RBC: 3.21 MIL/uL — ABNORMAL LOW (ref 4.22–5.81)
RDW: 12.6 % (ref 11.5–15.5)
WBC: 19 10*3/uL — ABNORMAL HIGH (ref 4.0–10.5)
nRBC: 0 % (ref 0.0–0.2)

## 2020-06-28 LAB — BASIC METABOLIC PANEL
Anion gap: 5 (ref 5–15)
BUN: 29 mg/dL — ABNORMAL HIGH (ref 8–23)
CO2: 23 mmol/L (ref 22–32)
Calcium: 7.8 mg/dL — ABNORMAL LOW (ref 8.9–10.3)
Chloride: 113 mmol/L — ABNORMAL HIGH (ref 98–111)
Creatinine, Ser: 0.94 mg/dL (ref 0.61–1.24)
GFR, Estimated: >60 mL/min (ref >60)
Glucose, Bld: 165 mg/dL — ABNORMAL HIGH (ref 70–99)
Potassium: 3.8 mmol/L (ref 3.5–5.1)
Sodium: 141 mmol/L (ref 135–145)

## 2020-06-28 LAB — GLUCOSE, CAPILLARY
Glucose-Capillary: 128 mg/dL — ABNORMAL HIGH (ref 70–99)
Glucose-Capillary: 129 mg/dL — ABNORMAL HIGH (ref 70–99)
Glucose-Capillary: 130 mg/dL — ABNORMAL HIGH (ref 70–99)
Glucose-Capillary: 139 mg/dL — ABNORMAL HIGH (ref 70–99)
Glucose-Capillary: 139 mg/dL — ABNORMAL HIGH (ref 70–99)
Glucose-Capillary: 140 mg/dL — ABNORMAL HIGH (ref 70–99)

## 2020-06-28 NOTE — Progress Notes (Deleted)
NAME:  Calvin Yates, MRN:  [id number], DOB:  01/08/1935, LOS: 13 ADMISSION DATE:  06/15/2020, CONSULTATION DATE:  06/15/20 REFERRING MD:  Dr Iver Nestle - Neuro, CHIEF COMPLAINT: Acute encephalopathy due to ICH  Brief History:  86 year old male, LKW 2/25 1800 at dinner with wife, found down 1815 with SBP > 200, found to have 7cm R frontoparietal ICH without midline shift, L-sided deficits. PCCM consulted for worsening encephalopathy/concern for respiratory failure.  Significant Hospital Events:  2/25 - Admit 3/03 - Intubated 3/07 - Extubated to [facility] 3/08 - ?Seizure activity, STAT EEG per Neuro (unchanged from prior), LTM  Consults:  PCCM  Procedures:  ETT 3/3 >> 3/7  Significant Diagnostic Tests:   CT HEAD 2/25 > Right frontoparietal parenchymal hemorrhage measuring 7.6 cm. Sub-5 mm left temporal hemorrhage. No significant midline shift or ventricular entrapment. Right greater than left cerebral convexity subarachnoid blood products.  CXR 06/18/2020 Interval placement of feeding tube that extends below the diaphragm into the stomach. New bibasilar opacities may be consistent with atelectasis but could also be consistent with aspiration pneumonia given clinical history. No overt edema, pleural fluid or pneumothorax.  CT Head wo contrast 06/19/2020 Slight enlargement in the parenchymal hemorrhage in the right frontal parietal region as described. Minimal midline shift from right to left is noted. Stable small parenchymal hemorrhage in the left temporal lobe unchanged from the prior exam.   Micro Data:  RVP panel 2/25 >> Negative  MRSA PCR 2/25 >> Negative  Respiratory Cx 2/28 >> Normal flora  Blood Cx 2/28 >> negative   Antimicrobials:  Rocephin 2/28 >> 3/4 Zosyn 3/6 >>  Interim History / Subjective:  Tmax overnight 100.1 No acute issues overnight   Objective   Blood pressure (!) 149/69, pulse 80, temperature 100.1 F (37.8 C), temperature source Oral, resp. rate (!) 28, height 5'  8" (1.727 m), weight 81.2 kg, SpO2 92 %.        Intake/Output Summary (Last 24 hours) at 06/28/2020 0733 Last data filed at 06/28/2020 0600 Gross per 24 hour  Intake 2284.9 ml  Output 1200 ml  Net 1084.9 ml   Filed Weights   06/22/20 0500 06/23/20 0500 06/24/20 0412  Weight: 78.4 kg 84.1 kg 81.2 kg   Physical Examination: General: Acute on chronic ill appearing elderly male lying in bed, in NAD HEENT: [place]/AT, MM pink/moist, PERRL, [place] in place  Neuro: Will answer some orientation questions and follow simple commands with continued prompting  CV: s1s2 regular rate and rhythm, no murmur, rubs, or gallops,  PULM:  Clear to ascultation bilaterally, no added breath sounds, oxygen sats 94-96 on RA GI: soft, bowel sounds active in all 4 quadrants, non-tender, non-distended, tolerating TF Extremities: warm/dry, no edema  Skin: no rashes or lesions  Resolved Hospital Problem list   Concern for C-spine injury Hypophosphatemia  Assessment & Plan:   Large intracranial hemorrhage and associated acute encephalopathy, left neglect  Worsened mental status requiring reintubation on 3/4 overnight Concern for possible cerebral amyloid angiopathy (CAA) -Slight enlargement in the parenchymal hemorrhage with minimal R > L shift noted 3/1 on repeat CT head. P: Management per neurology Maintain neuro protective measures; goal for eurothermia, euglycemia, eunatermia, normoxia, and PCO2 goal of 35-40 Nutrition and bowel regiment  Seizure precautions  Aspirations precautions  SBP goal < 160  Possible focal R seizure activity -Questionable focal RLE "twitching" noted 3/8PM. Neuro with recommendation for STAT EEG which was grossly unremarkable. P: EEG negative for seizure activity 3/9  Acute hypoxic  respiratory failure requiring MV Aspiration pneumonia  -Per report, vomited en route to hospital. Respiratory culture with normal respiratory flora. Required urgent intubation overnight 3/4. Mental  status improved and patient was successfully extubated 3/7. CXR 3/8 stable to slightly improved. P: Head of bed elevated 30 degrees Ensure adequate pulmonary hygiene  Follow cultures  Wean supplemental oxygen   Pyrexia, leukocytosis -No obvious source of infection. -S/p 5-day course of ceftriaxone with continued leukocytosis/fever, continuing to uptrend P: T-max 100.1 overnight  Continue to trend WBC and fever curve  Vanc added 3/9 Follow repeat cultures   Hypertensive emergency associated with intracranial hemorrhage> now controlled -SBP on EMS arrival reportedly > 200. Echo 2/27 LVEF 70 to 75% and grade 1 diastolic dysfunction. P: Continue amlodipine and Coreg Closely monitor hemodynamic In the ICU setting  PRN IV antihypertensives   Hypergylcemia  -Controlled, has not been requiring insulin -Goal CBG 140-180 P: Continue to monitor   Acute anemia, likely due to acute illness P: Trend CBC  Transfuse per protocol   Physical deconditioning P: Continue PT/OT/SLP efforts  Best practice (evaluated daily)  Diet: TFs Pain/Anxiety/Delirium protocol (if indicated): N/A VAP protocol (if indicated): N/A DVT prophylaxis: Lovenox GI prophylaxis: PPI Glucose control: SSI Mobility: Bedrest Disposition: Neuro ICU  Goals of Care:   Family Updates: Per primary; Dr. Merrily Pew briefly discussed code status with family at bedside 3/8 (daughter, wife). Expressed that patient is currently doing ok, but in the event he decompensated asked family to consider whether or not patient would want to be reintubated as this would likely lead to a prolonged time on the ventilator and tracheostomy/PEG. Family wished to have some time to discuss.  Critical care time:   Performed by: Delfin Gant  Total critical care time: 32 minutes  Critical care time was exclusive of separately billable procedures and treating other patients.  Critical care was necessary to treat or prevent imminent or  life-threatening deterioration.  Critical care was time spent personally by me on the following activities: development of treatment plan with patient and/or surrogate as well as nursing, discussions with consultants, evaluation of patient's response to treatment, examination of patient, obtaining history from patient or surrogate, ordering and performing treatments and interventions, ordering and review of laboratory studies, ordering and review of radiographic studies, pulse oximetry and re-evaluation of patient's condition.  Delfin Gant, NP-C [facility] Personal contact information can be found on Amion  If no response please page: Adult pulmonary and critical care medicine pager on Amion unitl 7pm After 7pm please call [phone] 06/28/2020, 7:52 AM

## 2020-06-28 NOTE — Progress Notes (Signed)
Spoke with Raymon Mutton RN re PICC line removal and PIV.  States ok to leave second PIV out, one is adequate for current IV needs. Will proceed with PICC line removal.

## 2020-06-28 NOTE — Progress Notes (Signed)
  Speech Language Pathology Treatment: Dysphagia  Patient Details Name: Calvin Yates MRN: [id number] DOB: [birth year] Today's Date: 06/28/2020 Time: 1694-5038 SLP Time Calculation (min) (ACUTE ONLY): 16 min  Assessment / Plan / Recommendation Clinical Impression  Pt quite groggy today - speech dysarthric; oriented to person and engaged in social communication.  Maintained eyes closed duration of session.  Followed commands with delay. Continues to demonstrate dysphagia - however, lethargy is interfering to the degree that it is difficult to discern neuro impact on biomechanics of swallow.  He intermittently accepted/recognized ice chips, occasionally with passive spillage from right side of mouth and at other times actively manipulating ice.  There were oral delays, followed by significant coughing and only intermittently could a swallow be palpated.  Oral suctioning provided.  Pt is not ready for POs.  If LOA doesn't improve in next 48 hours and plan is for rehab at SNF level, may need to explore alternative source of nutrition with pt/daughter. SLP will follow.  HPI HPI: Calvin Yates is a 86 y.o. male with no significant history presented with slurred speech and L-sided weakness. MRI reported right frontoparietal parenchymal hemorrhage and left temporal hemorrhage. BSE 2/26 rec NPO. Intubated 3/3-3/7 due to respiratory distress, ST signed off and reordered 3/9. 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      SLP Plan  Continue with current plan of care       Recommendations  Diet recommendations: NPO Liquids provided via: Teaspoon Medication Administration: Via alternative means                Oral Care Recommendations: Oral care QID Follow up Recommendations: Inpatient Rehab SLP Visit Diagnosis: Dysphagia, oropharyngeal phase (R13.12) Plan: Continue with current plan of care       GO                Blenda Mounts Laurice 06/28/2020, 12:46 PM  Nature Kueker L. Samson Frederic, MA  CCC/SLP Acute Rehabilitation Services Office number [phone] Pager [phone]

## 2020-06-28 NOTE — Progress Notes (Signed)
PHARMACY - PHYSICIAN COMMUNICATION CRITICAL VALUE ALERT - BLOOD CULTURE IDENTIFICATION (BCID)  Calvin Yates is an 86 y.o. male who presented to [facility] on 06/15/2020  Assessment:  40 yom presenting with ICH, started on zosyn/vancomycin for PNA. Now 1 of 4 Bcx bottles growing staph epidermidis with methicillin resistance.  Name of physician (or Provider) Contacted: Milon Dikes  Current antibiotics: vancomycin/zosyn  Changes to prescribed antibiotics recommended:  no change - likely contaminant  Results for orders placed or performed during the hospital encounter of 06/15/20  Blood Culture ID Panel (Reflexed) (Collected: 06/27/2020 10:52 AM)  Result Value Ref Range   Enterococcus faecalis NOT DETECTED NOT DETECTED   Enterococcus Faecium NOT DETECTED NOT DETECTED   Listeria monocytogenes NOT DETECTED NOT DETECTED   Staphylococcus species DETECTED (A) NOT DETECTED   Staphylococcus aureus (BCID) NOT DETECTED NOT DETECTED   Staphylococcus epidermidis DETECTED (A) NOT DETECTED   Staphylococcus lugdunensis NOT DETECTED NOT DETECTED   Streptococcus species NOT DETECTED NOT DETECTED   Streptococcus agalactiae NOT DETECTED NOT DETECTED   Streptococcus pneumoniae NOT DETECTED NOT DETECTED   Streptococcus pyogenes NOT DETECTED NOT DETECTED   A.calcoaceticus-baumannii NOT DETECTED NOT DETECTED   Bacteroides fragilis NOT DETECTED NOT DETECTED   Enterobacterales NOT DETECTED NOT DETECTED   Enterobacter cloacae complex NOT DETECTED NOT DETECTED   Escherichia coli NOT DETECTED NOT DETECTED   Klebsiella aerogenes NOT DETECTED NOT DETECTED   Klebsiella oxytoca NOT DETECTED NOT DETECTED   Klebsiella pneumoniae NOT DETECTED NOT DETECTED   Proteus species NOT DETECTED NOT DETECTED   Salmonella species NOT DETECTED NOT DETECTED   Serratia marcescens NOT DETECTED NOT DETECTED   Haemophilus influenzae NOT DETECTED NOT DETECTED   Neisseria meningitidis NOT DETECTED NOT DETECTED   Pseudomonas  aeruginosa NOT DETECTED NOT DETECTED   Stenotrophomonas maltophilia NOT DETECTED NOT DETECTED   Candida albicans NOT DETECTED NOT DETECTED   Candida auris NOT DETECTED NOT DETECTED   Candida glabrata NOT DETECTED NOT DETECTED   Candida krusei NOT DETECTED NOT DETECTED   Candida parapsilosis NOT DETECTED NOT DETECTED   Candida tropicalis NOT DETECTED NOT DETECTED   Cryptococcus neoformans/gattii NOT DETECTED NOT DETECTED   Methicillin resistance mecA/C DETECTED (A) NOT DETECTED    Leia Alf, PharmD, BCPS Please check AMION for all [facility] Pharmacy contact numbers Clinical Pharmacist 06/28/2020 1:03 PM

## 2020-06-28 NOTE — Progress Notes (Signed)
Upper and lower extremity venous has been completed.   Preliminary results in CV Proc.   [name] 06/28/2020 2:25 PM

## 2020-06-28 NOTE — Progress Notes (Signed)
Physical Therapy Treatment Patient Details Name: Calvin Yates MRN: [id number] DOB: Sep 08, 1934 Today's Date: 06/28/2020    History of Present Illness The pt is an 86 yo male presenting with elevated BP and tachycardia after being found down by his wife. 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    PT Comments    The pt was seen by PT/OT to safely progress functional activity tolerance and transfers. The pt was able to tolerate sitting EOb for ~10 min within session, but does continue to require maxA to manage bed mobility and seated stability at this time. The pt was able to follow commands with ~80% consistency with R-sided extremities, but required increased multimodal cues and repeated cues at times to complete. The pt was unable to follow any commands with L-sided extremities, and did not display any active movement at this time. The pt required totalA of 2 to transfer to recliner with pivot technique, and will require lift by RN staff to safely return to bed. The pt will continue to benefit from skilled PT to progress functional strength, stability, and alertness to allow for improved safety and decreased assist with OOB transfers.    Follow Up Recommendations  CIR     Equipment Recommendations  None recommended by PT    Recommendations for Other Services       Precautions / Restrictions Precautions Precautions: Fall Precaution Comments: c-spine cleared, no need for c-collar Restrictions Weight Bearing Restrictions: No    Mobility  Bed Mobility Overal bed mobility: Needs Assistance Bed Mobility: Rolling;Sidelying to Sit Rolling: Max assist;+2 for physical assistance;+2 for safety/equipment Sidelying to sit: Max assist;+2 for physical assistance;+2 for safety/equipment       General bed mobility comments: pt lethargic and slow to respond. maxA to initiate and complete movements. pt unable to use even r-sided extremities to  attempt to push up to sitting from sidelying    Transfers Overall transfer level: Needs assistance Equipment used: 2 person hand held assist Transfers: Sit to/from [employer] Sit to Stand: Total assist;+2 physical assistance   Squat pivot transfers: Total assist;+2 physical assistance;+2 safety/equipment     General transfer comment: cues for hand placement, bilateral blocking of knees. facilitation at pelvis for hip extension. totalA to pivot to recliner. lift pad in recliner for RN staff  Ambulation/Gait             General Gait Details: unable      Modified Rankin (Stroke Patients Only) Modified Rankin (Stroke Patients Only) Pre-Morbid Rankin Score: No symptoms Modified Rankin: Severe disability     Balance Overall balance assessment: Needs assistance Sitting-balance support: Feet supported;Single extremity supported Sitting balance-Leahy Scale: Poor Sitting balance - Comments: pt needing consistent maxA to maintain upright due to strong pushing to L with RUE. cues for repositioning of RUE, but pt with limited maintenance of these cues Postural control: Posterior lean;Left lateral lean Standing balance support: Single extremity supported Standing balance-Leahy Scale: Zero Standing balance comment: Patient requiring total assist +2 to pivot to recliner.                            Cognition Arousal/Alertness: Lethargic Behavior During Therapy: Flat affect Overall Cognitive Status: Impaired/Different from baseline Area of Impairment: Orientation;Attention;Following commands;Safety/judgement;Awareness;Problem solving                 Orientation Level: Disoriented to;Time;Situation;Place Current Attention Level: Focused   Following Commands: Follows  one step commands with increased time;Follows one step commands inconsistently Safety/Judgement: Decreased awareness of safety;Decreased awareness of deficits Awareness:  Intellectual Problem Solving: Slow processing;Decreased initiation;Difficulty sequencing;Requires verbal cues;Requires tactile cues General Comments: pt able to follow simple commands with RUE and RLE 80% of time, requires multimodal cues and repetition of commands. no acutive command following this session with L-extremities. did not open eyes      Exercises General Exercises - Lower Extremity Ankle Circles/Pumps: PROM;Left;AROM;Right;10 reps Long Arc Quad: PROM;Left;AROM;Right;10 reps;Seated Hip Flexion/Marching: PROM;Left;AROM;Right;10 reps;Seated Other Exercises Other Exercises: lateral lean to R with propping on elbow for 3-5 seconds before pushing back to midline. forward propping on R forearm to increase trunk flexion Other Exercises: passive stretch of chest/pecs. pressure at bilateral shoulder to encourage chest opening    General Comments General comments (skin integrity, edema, etc.): BP stable with transition to recliner.      Pertinent Vitals/Pain Pain Assessment: No/denies pain Faces Pain Scale: No hurt Pain Intervention(s): Monitored during session;Repositioned           PT Goals (current goals can now be found in the care plan section) Acute Rehab PT Goals Patient Stated Goal: none stated PT Goal Formulation: Patient unable to participate in goal setting Time For Goal Achievement: 07/11/20 Potential to Achieve Goals: Fair Progress towards PT goals: Not progressing toward goals - comment (pt with continued lethargy today)    Frequency    Min 4X/week      PT Plan Current plan remains appropriate    Co-evaluation PT/OT/SLP Co-Evaluation/Treatment: Yes Reason for Co-Treatment: Necessary to address cognition/behavior during functional activity;To address functional/ADL transfers;For patient/therapist safety PT goals addressed during session: Mobility/safety with mobility;Balance        AM-PAC PT "6 Clicks" Mobility   Outcome Measure  Help needed  turning from your back to your side while in a flat bed without using bedrails?: Total Help needed moving from lying on your back to sitting on the side of a flat bed without using bedrails?: Total Help needed moving to and from a bed to a chair (including a wheelchair)?: Total Help needed standing up from a chair using your arms (e.g., wheelchair or bedside chair)?: Total Help needed to walk in hospital room?: Total Help needed climbing 3-5 steps with a railing? : Total 6 Click Score: 6    End of Session Equipment Utilized During Treatment: Oxygen Activity Tolerance: Patient limited by lethargy Patient left: in chair;with chair alarm set;Other (comment) (lift pad under pt) Nurse Communication: Mobility status;Need for lift equipment PT Visit Diagnosis: Hemiplegia and hemiparesis;Other abnormalities of gait and mobility (R26.89);Muscle weakness (generalized) (M62.81) Hemiplegia - Right/Left: Left Hemiplegia - dominant/non-dominant: Non-dominant Hemiplegia - caused by: Nontraumatic intracerebral hemorrhage     Time: 8250-5397 PT Time Calculation (min) (ACUTE ONLY): 26 min  Charges:  $Therapeutic Activity: 8-22 mins                     Rolm Baptise, PT, DPT   Acute Rehabilitation Department Pager #: [phone]   Gaetana Michaelis 06/28/2020, 9:14 AM

## 2020-06-28 NOTE — Progress Notes (Signed)
Transfer to floor. Order placed. Progressive unit.  Continue tele   -- Milon Dikes, MD Stroke Neurology Pager: [phone]

## 2020-06-28 NOTE — Progress Notes (Addendum)
NAME:  Calvin Yates, MRN:  [id number], DOB:  [birth year], LOS: 13 ADMISSION DATE:  06/15/2020, CONSULTATION DATE:  06/15/20 REFERRING MD:  Dr Iver Nestle - Neuro, CHIEF COMPLAINT: Acute encephalopathy due to ICH  Brief History:  86 year old male, LKW 2/25 1800 at dinner with wife, found down 1815 with SBP > 200, found to have 7cm R frontoparietal ICH without midline shift, L-sided deficits. PCCM consulted for worsening encephalopathy/concern for respiratory failure.  Significant Hospital Events:  2/25 - Admit 3/03 - Intubated 3/07 - Extubated to [facility] 3/08 - ?Seizure activity, STAT EEG per Neuro (unchanged from prior), LTM  Consults:  PCCM  Procedures:  Right PICC 3/1 > ETT 3/3 >> 3/7  Significant Diagnostic Tests:   CT HEAD 2/25 > Right frontoparietal parenchymal hemorrhage measuring 7.6 cm. Sub-5 mm left temporal hemorrhage. No significant midline shift or ventricular entrapment. Right greater than left cerebral convexity subarachnoid blood products.  CXR 06/18/2020 Interval placement of feeding tube that extends below the diaphragm into the stomach. New bibasilar opacities may be consistent with atelectasis but could also be consistent with aspiration pneumonia given clinical history. No overt edema, pleural fluid or pneumothorax.  CT Head wo contrast 06/19/2020 Slight enlargement in the parenchymal hemorrhage in the right frontal parietal region as described. Minimal midline shift from right to left is noted. Stable small parenchymal hemorrhage in the left temporal lobe unchanged from the prior exam.   Micro Data:  RVP panel 2/25 >> Negative  MRSA PCR 2/25 >> Negative  Respiratory Cx 2/28 >> Normal flora  Blood Cx 2/28 >> negative   Antimicrobials:  Rocephin 2/28 >> 3/4 Zosyn 3/6 >>  Interim History / Subjective:  Tmax overnight 100.1 No acute issues overnight   Objective   Blood pressure (!) 149/69, pulse 80, temperature 100.1 F (37.8 C), temperature source Oral, resp. rate  (!) 28, height 5\' 8"  (1.727 m), weight 81.2 kg, SpO2 92 %.        Intake/Output Summary (Last 24 hours) at 06/28/2020 0804 Last data filed at 06/28/2020 0600 Gross per 24 hour  Intake 2219.9 ml  Output 1200 ml  Net 1019.9 ml   Filed Weights   06/22/20 0500 06/23/20 0500 06/24/20 0412  Weight: 78.4 kg 84.1 kg 81.2 kg   Physical Examination: General: Acute on chronic ill appearing elderly male lying in bed, in NAD HEENT: [place]/AT, MM pink/moist, PERRL, [place] in place  Neuro: Will answer some orientation questions and follow simple commands with continued prompting  CV: s1s2 regular rate and rhythm, no murmur, rubs, or gallops,  PULM:  Clear to ascultation bilaterally, no added breath sounds, oxygen sats 94-96 on RA GI: soft, bowel sounds active in all 4 quadrants, non-tender, non-distended, tolerating TF Extremities: warm/dry, no edema  Skin: no rashes or lesions  Resolved Hospital Problem list   Concern for C-spine injury Hypophosphatemia  Assessment & Plan:   Large intracranial hemorrhage and associated acute encephalopathy, left neglect  Worsened mental status requiring reintubation on 3/4 overnight Concern for possible cerebral amyloid angiopathy (CAA) -Slight enlargement in the parenchymal hemorrhage with minimal R > L shift noted 3/1 on repeat CT head. P: Management per neurology Maintain neuro protective measures; goal for eurothermia, euglycemia, eunatermia, normoxia, and PCO2 goal of 35-40 Nutrition and bowel regiment  Seizure precautions  Aspirations precautions  SBP goal < 160  Possible focal R seizure activity -Questionable focal RLE "twitching" noted 3/8PM. Neuro with recommendation for STAT EEG which was grossly unremarkable. P: EEG negative for seizure activity  3/9  Acute hypoxic respiratory failure requiring MV Aspiration pneumonia  -Per report, vomited en route to hospital. Respiratory culture with normal respiratory flora. Required urgent intubation  overnight 3/4. Mental status improved and patient was successfully extubated 3/7. CXR 3/8 stable to slightly improved. P: Head of bed elevated 30 degrees Ensure adequate pulmonary hygiene  Follow cultures  Wean supplemental oxygen   Pyrexia, leukocytosis -No obvious source of infection. -S/p 5-day course of ceftriaxone with continued leukocytosis/fever, continuing to uptrend P: T-max 100.1 overnight  Continue to trend WBC and fever curve  Vanc added 3/9 Follow repeat cultures  Remove PICC line   Hypertensive emergency associated with intracranial hemorrhage> now controlled -SBP on EMS arrival reportedly > 200. Echo 2/27 LVEF 70 to 75% and grade 1 diastolic dysfunction. P: Continue amlodipine and Coreg Closely monitor hemodynamic In the ICU setting  PRN IV antihypertensives   Hypergylcemia  -Controlled, has not been requiring insulin -Goal CBG 140-180 P: Continue to monitor   Acute anemia, likely due to acute illness P: Trend CBC  Transfuse per protocol   Physical deconditioning P: Continue PT/OT/SLP efforts  Best practice (evaluated daily)  Diet: TFs Pain/Anxiety/Delirium protocol (if indicated): N/A VAP protocol (if indicated): N/A DVT prophylaxis: Lovenox GI prophylaxis: PPI Glucose control: SSI Mobility: Bedrest Disposition: Neuro ICU  Goals of Care:   Family Updates: Per primary; Dr. Merrily Pew briefly discussed code status with family at bedside 3/8 (daughter, wife). Expressed that patient is currently doing ok, but in the event he decompensated asked family to consider whether or not patient would want to be reintubated as this would likely lead to a prolonged time on the ventilator and tracheostomy/PEG. Family wished to have some time to discuss.  Critical care time:   Performed by: Delfin Gant  Total critical care time: 32 minutes  Critical care time was exclusive of separately billable procedures and treating other patients.  Critical care was  necessary to treat or prevent imminent or life-threatening deterioration.  Critical care was time spent personally by me on the following activities: development of treatment plan with patient and/or surrogate as well as nursing, discussions with consultants, evaluation of patient's response to treatment, examination of patient, obtaining history from patient or surrogate, ordering and performing treatments and interventions, ordering and review of laboratory studies, ordering and review of radiographic studies, pulse oximetry and re-evaluation of patient's condition.  Delfin Gant, NP-C [facility] Personal contact information can be found on Amion  If no response please page: Adult pulmonary and critical care medicine pager on Amion unitl 7pm After 7pm please call [phone] 06/28/2020, 8:04 AM

## 2020-06-28 NOTE — Progress Notes (Addendum)
STROKE TEAM PROGRESS NOTE   INTERVAL HISTORY Remains extubated. Sitting up in chair with family-daughter Rosann Auerbachrish and wife Yoko at bedside. Much more cooperative with exam today. Continues to have leukocytosis  Vitals:   06/28/20 0824 06/28/20 0900 06/28/20 1000 06/28/20 1100  BP:  (!) 121/49 140/60 (!) 143/61  Pulse:  73 77 78  Resp:  (!) 22 (!) 21 (!) 27  Temp: 98.6 F (37 C)     TempSrc: Axillary     SpO2:  95% 97% 95%  Weight:      Height:       CBC:  Recent Labs  Lab 06/25/20 0754 06/26/20 0805 06/27/20 0318 06/28/20 0825  WBC 15.1* 18.4* 19.3* 19.0*  NEUTROABS 11.2* 14.6*  --   --   HGB 10.7* 10.5* 11.1* 10.4*  HCT 34.5* 33.6* 34.7* 32.6*  MCV 105.2* 101.8* 100.9* 101.6*  PLT 190 219 220 216   Basic Metabolic Panel:  Recent Labs  Lab 06/27/20 0318 06/28/20 0825  NA 146* 141  K 4.0 3.8  CL 115* 113*  CO2 22 23  GLUCOSE 134* 165*  BUN 29* 29*  CREATININE 0.98 0.94  CALCIUM 8.1* 7.8*  MG 2.4  --   PHOS 3.4  --    Lipid Panel:  Recent Labs  Lab 06/23/20 0506  TRIG 118   HgbA1c:  No results for input(s): HGBA1C in the last 168 hours. Urine Drug Screen:  No results for input(s): LABOPIA, COCAINSCRNUR, LABBENZ, AMPHETMU, THCU, LABBARB in the last 168 hours.  Alcohol Level No results for input(s): ETH in the last 168 hours.  IMAGING past 24 hours ECHOCARDIOGRAM COMPLETE  Result Date: 06/17/2020 IMPRESSIONS   1. Left ventricular ejection fraction, by estimation, is 70 to 75%. The left ventricle has hyperdynamic function. The left ventricle has no regional wall motion abnormalities. Left ventricular diastolic parameters are consistent with Grade I diastolic dysfunction (impaired relaxation).   2. Right ventricular systolic function is normal. The right ventricular size is normal. Tricuspid regurgitation signal is inadequate for assessing PA pressure.   3. The mitral valve is normal in structure. Trivial mitral valve regurgitation. No evidence of mitral  stenosis.   4. The aortic valve is tricuspid. Aortic valve regurgitation is not visualized. Mild aortic valve sclerosis is present, with no evidence of aortic valve stenosis.   5. The inferior vena cava is normal in size with greater than 50% respiratory variability, suggesting right atrial pressure of 3 mmHg.    IMAGING past 24 hours MR BRAIN W WO CONTRAST  Result Date: 06/15/2020 CLINICAL DATA:   IMPRESSION: 1. Innumerable foci of hemosiderin deposition scattered throughout the brain consistent with previous hemorrhagic infarctions. Intraparenchymal hematoma at the right frontoparietal junction with internal clot retraction, measuring 7.8 x 4.7 x 4.6 cm. No abnormal enhancement to suggest that this represents a hemorrhagic mass. Small hemorrhage in the left temporal lobe cannot be specifically identified by MRI as different than the other foci of hemosiderin deposition. 2. Small amount of subarachnoid hemorrhage within the sulci. 3. Mild mass-effect upon the right lateral ventricle with right-to-left shift 3 mm. Chronic small-vessel ischemic changes of the white matter elsewhere. The overall pattern could either be due to amyloid angiopathy or hypertensive vascular disease. 4. No finding to suggest the presence metastatic disease. Electronically Signed   By: Paulina FusiMark  Shogry M.D.   On: 06/15/2020 22:56   DG CHEST PORT 1 VIEW  Result Date: 06/15/2020 CLINICAL DATA:  Unresponsive. EXAM: PORTABLE CHEST 1 VIEW COMPARISON:  None.  FINDINGS: The heart size and mediastinal contours are within normal limits. Both lungs are clear. The visualized skeletal structures are unremarkable. IMPRESSION: No active disease. Electronically Signed   By: Lupita Raider M.D.   On: 06/15/2020 20:29   CT HEAD CODE STROKE WO CONTRAST  Result Date: 06/15/2020 IMPRESSION:  1. Right frontoparietal parenchymal hemorrhage measuring 7.6 cm. Sub-5 mm left temporal hemorrhage.  2. No significant midline shift or ventricular  entrapment.  3. Right greater than left cerebral convexity subarachnoid blood products.  4. ASPECTS is 10.    Current Facility-Administered Medications:  .  0.9 %  sodium chloride infusion, , Intravenous, PRN, Micki Riley, MD, Stopped at 06/28/20 [id number] .  acetaminophen (TYLENOL) tablet 650 mg, 650 mg, Oral, Q4H PRN, 650 mg at 06/24/20 1628 **OR** acetaminophen (TYLENOL) 160 MG/5ML solution 650 mg, 650 mg, Per Tube, Q4H PRN, 650 mg at 06/27/20 0813 **OR** acetaminophen (TYLENOL) suppository 650 mg, 650 mg, Rectal, Q4H PRN, Bhagat, Srishti L, MD, 650 mg at 06/17/20 0023 .  amLODipine (NORVASC) tablet 10 mg, 10 mg, Per Tube, Daily, Charlott Holler, MD, 10 mg at 06/28/20 1113 .  bisacodyl (DULCOLAX) suppository 10 mg, 10 mg, Rectal, Daily PRN, Olivencia-Simmons, Ivelisse, NP .  carvedilol (COREG) tablet 25 mg, 25 mg, Per Tube, BID, Charlott Holler, MD, 25 mg at 06/28/20 5621 .  chlorhexidine (PERIDEX) 0.12 % solution 15 mL, 15 mL, Mouth Rinse, BID, Milon Dikes, MD, 15 mL at 06/27/20 2250 .  Chlorhexidine Gluconate Cloth 2 % PADS 6 each, 6 each, Topical, Daily, Bhagat, Srishti L, MD, 6 each at 06/27/20 1235 .  enoxaparin (LOVENOX) injection 40 mg, 40 mg, Subcutaneous, Daily, Micki Riley, MD, 40 mg at 06/28/20 1113 .  feeding supplement (OSMOLITE 1.5 CAL) liquid 1,000 mL, 1,000 mL, Per Tube, Continuous, Micki Riley, MD, Last Rate: 55 mL/hr at 06/28/20 0824, 1,000 mL at 06/28/20 0824 .  feeding supplement (PROSource TF) liquid 45 mL, 45 mL, Per Tube, BID, Micki Riley, MD, 45 mL at 06/28/20 1113 .  fentaNYL (SUBLIMAZE) injection 25 mcg, 25 mcg, Intravenous, Q15 min PRN, Raymon Mutton F, NP .  free water 200 mL, 200 mL, Per Tube, Q6H, Cloyd Stagers M, PA-C, 200 mL at 06/28/20 3086 .  guaiFENesin (ROBITUSSIN) 100 MG/5ML solution 100 mg, 5 mL, Per Tube, Q12H, Bevelyn Ngo, NP, 100 mg at 06/28/20 0808 .  hydrALAZINE (APRESOLINE) injection 20 mg, 20 mg, Intravenous, Q6H PRN,  Olivencia-Simmons, Ivelisse, NP, 20 mg at 06/21/20 2233 .  insulin aspart (novoLOG) injection 0-6 Units, 0-6 Units, Subcutaneous, Q4H, Bhagat, Srishti L, MD, 1 Units at 06/25/20 0827 .  labetalol (NORMODYNE) injection 5-20 mg, 5-20 mg, Intravenous, Q2H PRN, Milon Dikes, MD, 5 mg at 06/26/20 0540 .  lisinopril (ZESTRIL) tablet 5 mg, 5 mg, Per Tube, Daily, Milon Dikes, MD, 5 mg at 06/28/20 1113 .  MEDLINE mouth rinse, 15 mL, Mouth Rinse, q12n4p, Milon Dikes, MD, 15 mL at 06/27/20 1551 .  multivitamin with minerals tablet 1 tablet, 1 tablet, Per Tube, Daily, Micki Riley, MD, 1 tablet at 06/28/20 1113 .  ondansetron (ZOFRAN) injection 4 mg, 4 mg, Intravenous, Q6H PRN, Bhagat, Srishti L, MD, 4 mg at 06/16/20 1014 .  pantoprazole sodium (PROTONIX) 40 mg/20 mL oral suspension 40 mg, 40 mg, Per Tube, QHS, Micki Riley, MD, 40 mg at 06/27/20 2250 .  piperacillin-tazobactam (ZOSYN) IVPB 3.375 g, 3.375 g, Intravenous, Q8H, von Dohlen, Haley B, RPH, Last Rate: 12.5 mL/hr  at 06/28/20 0900, Infusion Verify at 06/28/20 0900 .  senna-docusate (Senokot-S) tablet 1 tablet, 1 tablet, Per Tube, BID, Micki Riley, MD, 1 tablet at 06/26/20 1141 .  sodium chloride flush (NS) 0.9 % injection 10-40 mL, 10-40 mL, Intracatheter, Q12H, Micki Riley, MD, 10 mL at 06/27/20 2250 .  sodium chloride flush (NS) 0.9 % injection 10-40 mL, 10-40 mL, Intracatheter, PRN, Pearlean Brownie, Pramod S, MD .  vancomycin (VANCOREADY) IVPB 1500 mg/300 mL, 1,500 mg, Intravenous, Q24H, von Dohlen, Haley B, RPH   PHYSICAL EXAM  Temp:  [98.4 F (36.9 C)-100.1 F (37.8 C)] 98.6 F (37 C) (03/10 0824) Pulse Rate:  [73-80] 78 (03/10 1100) Resp:  [20-28] 27 (03/10 1100) BP: (121-151)/(49-69) 143/61 (03/10 1100) SpO2:  [92 %-97 %] 95 % (03/10 1100) General: Well-developed well-nourished, comfortably sitting in bed HEENT: Normocephalic, atraumatic, NG tube in place CVS: Regular rate rhythm Respiratory: Scattered rales, supplemental O2  in place. Abdomen nondistended nontender Neurological exam Sitting in chair, eyes closed, responds to voice. He was able to tell me his name and a very dysarthric tone Does not appear to have aphasia Has poor attention concentration Cranial nerves: Pupils are equal round reactive to light, blinks to threat from right but not from the left, does not cross midline to the left-has right gaze preference, left lower facial weakness also seen. Motor exam: Right upper and lower extremity are 4+/5.  Left upper extremity is nearly flaccid with twitching movement to pain.  Left lower extremity with a little bit more withdrawal today than I have seen before. Sensory: Grimaces to noxious stimulation all over, without any significant difference noted between the grimace to noxious stimulation on the right versus left. Coordination: Difficult to assess given his mentation  ASSESSMENT/PLAN  86 year old male with very minimal past medical history. Ate dinner with his wife at 1730 and having a conversationtill 1800 hrs. Abruptly at 1815 was found to be down. EMS on arrival found his blood pressure to be high systolic 200s with slurred speech and left sided weakness. He had an episode of emesis on route and was transiently hypoxemic.  Exam today is much improved in the prior 3 days.  Remains extubated and sitting in chair today, speaking with family although very dysarthric.  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   CTA head & neck stable ICH, Mild to moderate right M2, right ICA terminus, distal basilar artery and bilateral PCA narrowing.  MRI stable hematoma, no underlying hemorrhagic mets. Likely CAA vs hypertensive changes. Chronic hemosiderin deposits from prior ICHs .  2D Echo: EF 70-75%, Grade I diastolic  dysfunction  LDL 103  HgbA1c 5.6  VTE prophylaxis - Lovenox 40mg  daily  No AC/AP prior to admission, now no AC/AP due to ICH and likely CAA  Therapy recommendations:  CIR  Disposition:  pending   Cerebral Edema  Was on HTS. Off 3% saline now  Na now 146  Allow Na gradually trending down with continuing FW flushes-we will probably discontinue free water flushes tomorrow.  Concern for seizure -LTM negative for seizures.  Do not see a need for antiepileptics.  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 The overall pattern could either be due to amyloid angiopathy or hypertensive vascular disease.  No antiplatelets or anticoagulation.  Long term BP goal < 140   Hypertension  Home meds: none, no hx of HTN but with BP 200s on EMS arrival  Currently on Norvasc 10mg  daily, coreg 25mg  bid - add Lisinopril 5mg  daily as he is still over SBP 140 at times.  off cleviprex infusion  Labetalol 20mg  q2hr prn, hydralazine 20mg  q6hr prn  Long-term BP goal < 140 due to CAA.   Hyperlipidemia  Home meds:  None   LDL 103, goal < 70  No statin at this time due to ICH and concern of CAA  Dysphagia  Secondary to stroke  SLP following  Cortrak in place  On Tube feeds and FW  Aspiration Pneumonia  Vomited in route w/EMS  Febrile: Tmax 101.2->afebile  Leukocytosis: WBC 19.7 -> 21.2 ->12.2->11.4->13.8-->15.1-->18.1-->19.3  CXR bibasilar opacities  Completed 5 days of ceftriaxone the point he will follow.  Currently on vancomycin and Zosyn.  Appreciate CCM and pharmacy assistance.  Blood cultures (3/6): pending  Sputum culture (2/28): Negative  Consider removal of the PICC line-discussed with CCM who evaluated. Taking out today after abx  infusion.  Left arm and bilateral lower extremity swelling -Check Dopplers bilateral lower extremity and left upper extremity-reviewed-negative for DVT.  Respiratory failure  Likely related to aspiration-resolved.   CCM had a conversation with family regarding reintubation-they need some time to think about goals of care.  We will continue full scope of care for now. Seems to be holding well for now.  Other Stroke Risk Factors  Advanced Age >/= 5    Appreciate PCCM assistance Discussed with Dr. on the unit    Hospital day # 13  This patient is critically ill due to large right ICH and mass effect, probable CAA, respiratory failure, aspiration, fever and at significant risk of neurological worsening, death form seizure, hematoma expansion, brain herniation, sepsis, heart failure. This patient's care requires constant monitoring of vital signs, hemodynamics, respiratory and cardiac monitoring, review of multiple databases, neurological assessment, discussion with family, other specialists and medical decision making of high complexity. I spent 35 minutes of neurocritical care time in the care of this patient.  -- , MD Neurologist [facility] Pager: [phone]    To contact Stroke Continuity provider, please refer to [phone]. After hours, contact General Neurology

## 2020-06-28 NOTE — Progress Notes (Addendum)
Inpatient Rehabilitation Admissions Coordinator  I will place rehab consult per protocol now that patient extubated and therapy involved. We had signed off when he was intubated.Rehab consult previously completed by Dr. Ranell Patrick on 3/2.  Danne Baxter, RN, MSN Rehab Admissions Coordinator [phone] 06/28/2020 10:03 AM   I met with patient and his daughter, Mardene Celeste and wife Yoko at bedside. Patient not yet at a a level to pursue CIR admit due to his tolerance level. I will follow up over the next few days, but patient likely will need SNF for prolonged rehab course. Daughter is aware that CIR vs SNF to be considered.  Danne Baxter, RN, MSN Rehab Admissions Coordinator [phone] 06/28/2020 11:37 AM

## 2020-06-28 NOTE — Progress Notes (Signed)
Occupational Therapy Treatment Patient Details Name: Calvin Yates MRN: [id number] DOB: 1934/04/22 Today's Date: 06/28/2020    History of present illness 86 yo male presenting with elevated BP and tachycardia after being found down by his wife. 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   OT comments  Pt progressing slowly towards established OT goals. Pt following simple commands (inconsistently) with increased time and cues. Pt maintaining eyes closed despite following cues; pt lifting chin when attempting to open eyes. Pt tolerating sitting at EOB for ~10 min with Max A for posterior lean/push. Pt requiring Total A +2 for stand pivot to recliner with bil knees blocked. Continue to recommend dc to CIR and will continue to follow acutely as admitted.    Follow Up Recommendations  CIR;Supervision/Assistance - 24 hour    Equipment Recommendations  3 in 1 bedside commode;Wheelchair (measurements OT);Wheelchair cushion (measurements OT);Hospital bed    Recommendations for Other Services Rehab consult    Precautions / Restrictions Precautions Precautions: Fall Precaution Comments: c-spine cleared, no need for c-collar Restrictions Weight Bearing Restrictions: No       Mobility Bed Mobility Overal bed mobility: Needs Assistance Bed Mobility: Rolling;Sidelying to Sit Rolling: Max assist;+2 for physical assistance;+2 for safety/equipment Sidelying to sit: Max assist;+2 for physical assistance;+2 for safety/equipment       General bed mobility comments: pt lethargic and slow to respond. maxA to initiate and complete movements. pt unable to use even r-sided extremities to attempt to push up to sitting from sidelying    Transfers Overall transfer level: Needs assistance Equipment used: 2 person hand held assist Transfers: Sit to/from [employer] Sit to Stand: Total assist;+2 physical assistance   Squat pivot  transfers: Total assist;+2 physical assistance;+2 safety/equipment     General transfer comment: cues for hand placement, bilateral blocking of knees. facilitation at pelvis for hip extension. totalA to pivot to recliner. lift pad in recliner for RN staff    Balance Overall balance assessment: Needs assistance Sitting-balance support: Feet supported;Single extremity supported Sitting balance-Leahy Scale: Poor Sitting balance - Comments: pt needing consistent maxA to maintain upright due to strong pushing to L with RUE. cues for repositioning of RUE, but pt with limited maintenance of these cues Postural control: Posterior lean;Left lateral lean Standing balance support: Single extremity supported Standing balance-Leahy Scale: Zero Standing balance comment: Patient requiring total assist +2 to pivot to recliner.                           ADL either performed or assessed with clinical judgement   ADL Overall ADL's : Needs assistance/impaired                                     Functional mobility during ADLs: Total assistance;+2 for physical assistance;+2 for safety/equipment;Cueing for sequencing General ADL Comments: Focused session on sitting balance, activity tolerance, and following of cues     Vision       Perception     Praxis      Cognition Arousal/Alertness: Lethargic Behavior During Therapy: Flat affect Overall Cognitive Status: Impaired/Different from baseline Area of Impairment: Orientation;Attention;Following commands;Safety/judgement;Awareness;Problem solving                 Orientation Level: Disoriented to;Time;Situation;Place Current Attention Level: Focused   Following Commands: Follows one step commands with increased time;Follows one  step commands inconsistently Safety/Judgement: Decreased awareness of safety;Decreased awareness of deficits Awareness: Intellectual Problem Solving: Slow processing;Decreased  initiation;Difficulty sequencing;Requires verbal cues;Requires tactile cues General Comments: pt able to follow simple commands with RUE and RLE 80% of time, requires multimodal cues and repetition of commands. no active command following this session with L-extremities. did not open eyes        Exercises Exercises: General Lower Extremity;Other exercises General Exercises - Lower Extremity Ankle Circles/Pumps: PROM;Left;AROM;Right;10 reps Long Arc Quad: PROM;Left;AROM;Right;10 reps;Seated Hip Flexion/Marching: PROM;Left;AROM;Right;10 reps;Seated Other Exercises Other Exercises: lateral lean to R with propping on elbow for 3-5 seconds before pushing back to midline. forward propping on R forearm to increase trunk flexion Other Exercises: passive stretch of chest/pecs. pressure at bilateral shoulder to encourage chest opening   Shoulder Instructions       General Comments BP stable with transition to recliner.    Pertinent Vitals/ Pain       Pain Assessment: No/denies pain Faces Pain Scale: No hurt Pain Intervention(s): Monitored during session;Repositioned  Home Living                                          Prior Functioning/Environment              Frequency  Min 2X/week        Progress Toward Goals  OT Goals(current goals can now be found in the care plan section)  Progress towards OT goals: Progressing toward goals  Acute Rehab OT Goals Patient Stated Goal: none stated OT Goal Formulation: Patient unable to participate in goal setting Time For Goal Achievement: 06/30/20 Potential to Achieve Goals: Fair ADL Goals Pt Will Perform Eating: with min assist;with adaptive utensils;sitting;bed level Pt Will Perform Grooming: with min guard assist;sitting Pt Will Transfer to Toilet: with mod assist;stand pivot transfer;bedside commode Pt/caregiver will Perform Home Exercise Program: Increased ROM;Increased strength;Left upper extremity;With minimal  assist Additional ADL Goal #1: Pt will follow 1-2 step commands with multimodal cues for sequencing. Additional ADL Goal #2: Pt will perform bed mobility with modA as precursor for ADL.  Plan Discharge plan remains appropriate    Co-evaluation    PT/OT/SLP Co-Evaluation/Treatment: Yes Reason for Co-Treatment: For patient/therapist safety;To address functional/ADL transfers PT goals addressed during session: Mobility/safety with mobility;Balance OT goals addressed during session: ADL's and self-care      AM-PAC OT "6 Clicks" Daily Activity     Outcome Measure   Help from another person eating meals?: Total Help from another person taking care of personal grooming?: A Little Help from another person toileting, which includes using toliet, bedpan, or urinal?: Total Help from another person bathing (including washing, rinsing, drying)?: Total Help from another person to put on and taking off regular upper body clothing?: Total Help from another person to put on and taking off regular lower body clothing?: Total 6 Click Score: 8    End of Session    OT Visit Diagnosis: Unsteadiness on feet (R26.81);Muscle weakness (generalized) (M62.81);Cognitive communication deficit (R41.841);Other symptoms and signs involving cognitive function;Hemiplegia and hemiparesis Symptoms and signs involving cognitive functions: Nontraumatic intracerebral hemorrhage;Other Nontraumatic ICH Hemiplegia - Right/Left: Left Hemiplegia - dominant/non-dominant: Non-Dominant Hemiplegia - caused by: Other cerebrovascular disease   Activity Tolerance Patient limited by lethargy   Patient Left with call bell/phone within reach;with chair alarm set;in chair;with restraints reapplied   Nurse Communication Mobility status  Time: 4132-4401 OT Time Calculation (min): 26 min  Charges: OT General Charges $OT Visit: 1 Visit OT Treatments $Therapeutic Activity: 8-22 mins  Reyah Streeter MSOT, OTR/L Acute  Rehab Pager: [phone] Office: [phone]   Theodoro Grist Aishia Barkey 06/28/2020, 9:26 AM

## 2020-06-29 DIAGNOSIS — R7881 Bacteremia: Secondary | ICD-10-CM

## 2020-06-29 DIAGNOSIS — B957 Other staphylococcus as the cause of diseases classified elsewhere: Secondary | ICD-10-CM | POA: Diagnosis not present

## 2020-06-29 DIAGNOSIS — I611 Nontraumatic intracerebral hemorrhage in hemisphere, cortical: Secondary | ICD-10-CM | POA: Diagnosis not present

## 2020-06-29 DIAGNOSIS — I619 Nontraumatic intracerebral hemorrhage, unspecified: Secondary | ICD-10-CM | POA: Diagnosis not present

## 2020-06-29 DIAGNOSIS — T17908A Unspecified foreign body in respiratory tract, part unspecified causing other injury, initial encounter: Secondary | ICD-10-CM | POA: Diagnosis not present

## 2020-06-29 LAB — CULTURE, BLOOD (ROUTINE X 2)
Culture: NO GROWTH
Culture: NO GROWTH

## 2020-06-29 LAB — CBC
HCT: 30.8 % — ABNORMAL LOW (ref 39.0–52.0)
Hemoglobin: 10.3 g/dL — ABNORMAL LOW (ref 13.0–17.0)
MCH: 32.6 pg (ref 26.0–34.0)
MCHC: 33.4 g/dL (ref 30.0–36.0)
MCV: 97.5 fL (ref 80.0–100.0)
Platelets: 232 10*3/uL (ref 150–400)
RBC: 3.16 MIL/uL — ABNORMAL LOW (ref 4.22–5.81)
RDW: 12.6 % (ref 11.5–15.5)
WBC: 18.5 10*3/uL — ABNORMAL HIGH (ref 4.0–10.5)
nRBC: 0 % (ref 0.0–0.2)

## 2020-06-29 LAB — BASIC METABOLIC PANEL
Anion gap: 7 (ref 5–15)
BUN: 32 mg/dL — ABNORMAL HIGH (ref 8–23)
CO2: 21 mmol/L — ABNORMAL LOW (ref 22–32)
Calcium: 7.6 mg/dL — ABNORMAL LOW (ref 8.9–10.3)
Chloride: 114 mmol/L — ABNORMAL HIGH (ref 98–111)
Creatinine, Ser: 0.96 mg/dL (ref 0.61–1.24)
GFR, Estimated: >60 mL/min (ref >60)
Glucose, Bld: 146 mg/dL — ABNORMAL HIGH (ref 70–99)
Potassium: 4 mmol/L (ref 3.5–5.1)
Sodium: 142 mmol/L (ref 135–145)

## 2020-06-29 LAB — GLUCOSE, CAPILLARY
Glucose-Capillary: 137 mg/dL — ABNORMAL HIGH (ref 70–99)
Glucose-Capillary: 121 mg/dL — ABNORMAL HIGH (ref 70–99)

## 2020-06-29 NOTE — Progress Notes (Signed)
Patient remains stable for transfer out of ICU and is ready for transition to hospitalist service. Report given to Dr. Tyson Babinski and his team will assume care starting 3/12  PCCM will sign off. Thank you for the opportunity to participate in this patient's care. Please contact if we can be of further assistance.  Delfin Gant, NP-C [facility] Personal contact information can be found on Amion  If no response please page: Adult pulmonary and critical care medicine pager on Amion unitl 7pm After 7pm please call [phone] 06/29/2020, 11:07 AM

## 2020-06-29 NOTE — Progress Notes (Addendum)
  Speech Language Pathology Treatment: Dysphagia  Patient Details Name: Calvin Yates MRN: [id number] DOB: [birth year] Today's Date: 06/29/2020 Time: 4835-0757 SLP Time Calculation (min) (ACUTE ONLY): 22 min  Assessment / Plan / Recommendation Clinical Impression  Unfortunately, pt has not made significant improvements in alertness or ability to attend to and transfer ice chips to initiate swallow. He did respond orally to questions with mod-severe dysarthria Oral care removed partial dried mucous on soft palate with incomplete opening of oral cavity. Therapist notified nurse of minimal bleeding as a result of oral hygiene. Pt manipulated/crunched ice chip approximately 5 seconds, held and required cues to swallow unsuccessfully. Given his xerstomia and suspected dry pharynx, did not administer thicker consistencies. Oral care with continued NPO status recommended and discussion with MD and family re: longer term alternate means of nutrition and/or Palliative care. SLP will continue.    HPI HPI: Calvin Yates is a 86 y.o. male with no significant history presented with slurred speech and L-sided weakness. MRI reported right frontoparietal parenchymal hemorrhage and left temporal hemorrhage. BSE 2/26 rec NPO. Intubated 3/3-3/7 due to respiratory distress, ST signed off and reordered 3/9. 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      SLP Plan  Continue with current plan of care       Recommendations  Diet recommendations: NPO Medication Administration: Via alternative means                Oral Care Recommendations: Oral care QID Follow up Recommendations: Skilled Nursing facility SLP Visit Diagnosis: Dysphagia, oropharyngeal phase (R13.12) Plan: Continue with current plan of care                      [name] 06/29/2020, 12:38 PM  [name].Ed Nurse, children's [phone] Office [phone]

## 2020-06-29 NOTE — Progress Notes (Signed)
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 On room air with oxygen saturations wnl. Stable neurologic exam.  Possible candidate for floor transfer   Vitals:   06/29/20 0500 06/29/20 0600 06/29/20 0700 06/29/20 0800  BP: (!) 132/49 (!) 135/58 (!) 127/52   Pulse: 79 79 68   Resp: 19 20 20    Temp:    98.3 F (36.8 C)  TempSrc:    Oral  SpO2: 98% 98% 99%   Weight:      Height:       CBC:  Recent Labs  Lab 06/25/20 0754 06/26/20 0805 06/27/20 0318 06/28/20 0825 06/29/20 0108  WBC 15.1* 18.4*   < > 19.0* 18.5*  NEUTROABS 11.2* 14.6*  --   --   --   HGB 10.7* 10.5*   < > 10.4* 10.3*  HCT 34.5* 33.6*   < > 32.6* 30.8*  MCV 105.2* 101.8*   < > 101.6* 97.5  PLT 190 219   < > 216 232   < > = values in this interval not displayed.   Basic Metabolic Panel:  Recent Labs  Lab 06/27/20 0318 06/28/20 0825 06/29/20 0108  NA 146* 141 142  K 4.0 3.8 4.0  CL 115* 113* 114*  CO2 22 23 21*  GLUCOSE 134* 165* 146*  BUN 29* 29* 32*  CREATININE 0.98 0.94 0.96  CALCIUM 8.1* 7.8* 7.6*  MG 2.4  --   --   PHOS 3.4  --   --    Lipid Panel:  Recent Labs  Lab 06/23/20 0506  TRIG 118   HgbA1c:  No results for input(s): HGBA1C in the last 168 hours. Urine Drug Screen:  No results for input(s): LABOPIA, COCAINSCRNUR, LABBENZ, AMPHETMU, THCU, LABBARB in the last 168 hours.  Alcohol Level No results for input(s): ETH in the last 168 hours.  IMAGING past 24 hours ECHOCARDIOGRAM COMPLETE  Result Date: 06/17/2020 IMPRESSIONS   1. Left ventricular ejection fraction, by estimation, is 70 to 75%. The left ventricle has hyperdynamic function. The left ventricle has no regional wall motion abnormalities. Left ventricular diastolic parameters are consistent with Grade I diastolic dysfunction (impaired relaxation).   2.  Right ventricular systolic function is normal. The right ventricular size is normal. Tricuspid regurgitation signal is inadequate for assessing PA pressure.   3. The mitral valve is normal in structure. Trivial mitral valve regurgitation. No evidence of mitral stenosis.   4. The aortic valve is tricuspid. Aortic valve regurgitation is not visualized. Mild aortic valve sclerosis is present, with no evidence of aortic valve stenosis.   5. The inferior vena cava is normal in size with greater than 50% respiratory variability, suggesting right atrial pressure of 3 mmHg.    IMAGING past 24 hours MR BRAIN W WO CONTRAST  Result Date: 06/15/2020 CLINICAL DATA:   IMPRESSION: 1. Innumerable foci of hemosiderin deposition scattered throughout the brain consistent with previous hemorrhagic infarctions. Intraparenchymal hematoma at the right frontoparietal junction with internal clot retraction, measuring 7.8 x 4.7 x 4.6 cm. No abnormal enhancement to suggest that this represents a hemorrhagic mass. Small hemorrhage in the left temporal lobe cannot be specifically identified by MRI as different than the other foci of hemosiderin deposition. 2. Small amount of subarachnoid hemorrhage within the sulci. 3. Mild mass-effect upon the right lateral ventricle with right-to-left shift 3 mm.  Chronic small-vessel ischemic changes of the white matter elsewhere. The overall pattern could either be due to amyloid angiopathy or hypertensive vascular disease. 4. No finding to suggest the presence metastatic disease. Electronically Signed   By: Paulina Fusi M.D.   On: 06/15/2020 22:56   DG CHEST PORT 1 VIEW  Result Date: 06/15/2020 CLINICAL DATA:  Unresponsive. EXAM: PORTABLE CHEST 1 VIEW COMPARISON:  None. FINDINGS: The heart size and mediastinal contours are within normal limits. Both lungs are clear. The visualized skeletal structures are unremarkable. IMPRESSION: No active disease. Electronically Signed   By: Lupita Raider M.D.   On: 06/15/2020 20:29   CT HEAD CODE STROKE WO CONTRAST  Result Date: 06/15/2020 IMPRESSION:  1. Right frontoparietal parenchymal hemorrhage measuring 7.6 cm. Sub-5 mm left temporal hemorrhage.  2. No significant midline shift or ventricular entrapment.  3. Right greater than left cerebral convexity subarachnoid blood products.  4. ASPECTS is 10.    Current Facility-Administered Medications:  .  0.9 %  sodium chloride infusion, , Intravenous, PRN, Micki Riley, MD, Stopped at 06/28/20 1354 .  acetaminophen (TYLENOL) tablet 650 mg, 650 mg, Oral, Q4H PRN, 650 mg at 06/24/20 1628 **OR** acetaminophen (TYLENOL) 160 MG/5ML solution 650 mg, 650 mg, Per Tube, Q4H PRN, 650 mg at 06/29/20 0203 **OR** acetaminophen (TYLENOL) suppository 650 mg, 650 mg, Rectal, Q4H PRN, Bhagat, Srishti L, MD, 650 mg at 06/17/20 0023 .  amLODipine (NORVASC) tablet 10 mg, 10 mg, Per Tube, Daily, Charlott Holler, MD, 10 mg at 06/29/20 0906 .  bisacodyl (DULCOLAX) suppository 10 mg, 10 mg, Rectal, Daily PRN, Olivencia-Simmons, Ivelisse, NP .  carvedilol (COREG) tablet 25 mg, 25 mg, Per Tube, BID, Charlott Holler, MD, 25 mg at 06/29/20 0750 .  chlorhexidine (PERIDEX) 0.12 % solution 15 mL, 15 mL, Mouth Rinse, BID, Milon Dikes, MD, 15 mL at 06/29/20 0905 .  Chlorhexidine Gluconate Cloth 2 % PADS 6 each, 6 each, Topical, Daily, Bhagat, Srishti L, MD, 6 each at 06/28/20 1115 .  enoxaparin (LOVENOX) injection 40 mg, 40 mg, Subcutaneous, Daily, Micki Riley, MD, 40 mg at 06/29/20 0906 .  feeding supplement (OSMOLITE 1.5 CAL) liquid 1,000 mL, 1,000 mL, Per Tube, Continuous, Micki Riley, MD, Last Rate: 55 mL/hr at 06/29/20 0628, 1,000 mL at 06/29/20 0628 .  feeding supplement (PROSource TF) liquid 45 mL, 45 mL, Per Tube, BID, Micki Riley, MD, 45 mL at 06/29/20 0906 .  free water 200 mL, 200 mL, Per Tube, Q6H, Cloyd Stagers M, PA-C, 200 mL at 06/29/20 9563 .  guaiFENesin (ROBITUSSIN) 100 MG/5ML solution  100 mg, 5 mL, Per Tube, Q12H, Bevelyn Ngo, NP, 100 mg at 06/29/20 0752 .  hydrALAZINE (APRESOLINE) injection 20 mg, 20 mg, Intravenous, Q6H PRN, Olivencia-Simmons, Ivelisse, NP, 20 mg at 06/21/20 2233 .  labetalol (NORMODYNE) injection 5-20 mg, 5-20 mg, Intravenous, Q2H PRN, Milon Dikes, MD, 5 mg at 06/26/20 0540 .  lisinopril (ZESTRIL) tablet 5 mg, 5 mg, Per Tube, Daily, Milon Dikes, MD, 5 mg at 06/29/20 0906 .  MEDLINE mouth rinse, 15 mL, Mouth Rinse, q12n4p, Milon Dikes, MD, 15 mL at 06/28/20 1615 .  multivitamin with minerals tablet 1 tablet, 1 tablet, Per Tube, Daily, Micki Riley, MD, 1 tablet at 06/29/20 0906 .  ondansetron (ZOFRAN) injection 4 mg, 4 mg, Intravenous, Q6H PRN, Bhagat, Srishti L, MD, 4 mg at 06/16/20 1014 .  pantoprazole sodium (PROTONIX) 40 mg/20 mL oral suspension 40 mg, 40 mg, Per  Tube, QHS, Micki RileySethi, Pramod S, MD, 40 mg at 06/28/20 2206 .  senna-docusate (Senokot-S) tablet 1 tablet, 1 tablet, Per Tube, BID, Micki RileySethi, Pramod S, MD, 1 tablet at 06/28/20 2206 .  sodium chloride flush (NS) 0.9 % injection 10-40 mL, 10-40 mL, Intracatheter, Q12H, Micki RileySethi, Pramod S, MD, 10 mL at 06/29/20 0911 .  sodium chloride flush (NS) 0.9 % injection 10-40 mL, 10-40 mL, Intracatheter, PRN, Micki RileySethi, Pramod S, MD .  vancomycin (VANCOREADY) IVPB 1500 mg/300 mL, 1,500 mg, Intravenous, Q24H, von Pearletha FurlDohlen, Haley B, RPH, Stopped at 06/28/20 1403   PHYSICAL EXAM  Temp:  [98.3 F (36.8 C)-100.6 F (38.1 C)] 98.3 F (36.8 C) (03/11 0800) Pulse Rate:  [68-84] 68 (03/11 0700) Resp:  [18-27] 20 (03/11 0700) BP: (113-143)/(42-61) 127/52 (03/11 0700) SpO2:  [93 %-99 %] 99 % (03/11 0700) Weight:  [83.2 kg] 83.2 kg (03/11 0215) General: Well-developed well-nourished, resting in bed with eyes closed in NAD.  HEENT: Normocephalic, atraumatic, NG tube in place CVS: Regular rate rhythm Respiratory: Respiration even and unlabored\ GI: Abd soft, ND, NTTP  Neurological exam Keeps eyes closed unless  stimulated. Opens eyes briefly to verbal stimulation. Speech remains dysarthric with soft voice.  Oriented x4. Names 2/2 objects.  Has poor attention concentration and neglects the left hemibody.  Cranial nerves: Pupils are equal round reactive to light, blinks to threat from right but not from the left, does not cross midline to the left-has right gaze preference, left lower facial weakness also seen.  Motor exam: Right upper and lower extremity are 4+/5.  Left upper extremity is nearly flaccid with twitching movement to pain.  Left lower extremity with a little bit more withdrawal today than I have seen before. Sensory: Grimaces to noxious stimulation all over, without any significant difference noted between the grimace to noxious stimulation on the right versus left. Coordination: Difficult to assess given his mentation  ASSESSMENT/PLAN  86 year old male with very minimal past medical history. Ate dinner with his wife at 1730 and having a conversationtill 1800 hrs. Abruptly at 1815 was found to be down. EMS on arrival found his blood pressure to be high systolic 200s with slurred speech and left sided weakness. He had an episode of emesis on route and was transiently hypoxemic.   Remains extubated and neurologically and hemodynamically stable awaiting transfer to floor bed with telemetry since yesterday.    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  -CTA head & neck stable ICH, Mild to moderate right M2, right ICA terminus, distal basilar artery and bilateral PCA narrowing. -MRI stable hematoma, no underlying hemorrhagic mets. Likely CAA vs hypertensive changes. Chronic hemosiderin deposits from prior ICHs . -2D Echo: EF 70-75%, Grade I diastolic dysfunction -LDL 103 -HgbA1c 5.6 -VTE prophylaxis - Lovenox  40mg  daily -No AC/AP prior to admission, now no AC/AP due to ICH and likely CAA -Therapy recommendations:  CIR -Disposition:  pending   Cerebral Edema -Was on HTS. Off 3% saline now -Na now 146 -Allow Na gradually trending down with continuing FW flushes  Concern for seizure -LTM negative for seizures.  Do not see a need for antiepileptics.  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 The  overall pattern could either be due to amyloid angiopathy or hypertensive vascular disease. -No antiplatelets or anticoagulation. -Long term BP goal < 140   Hypertension -Home meds: none, no hx of HTN but with BP 200s on EMS arrival -Currently on Norvasc 10mg  daily, coreg 25mg  bid - added Lisinopril 5mg  daily 3/10. -off cleviprex infusion -Labetalol 20mg  q2hr prn, hydralazine 20mg  q6hr prn -Long-term BP goal < 140 due to CAA.   Hyperlipidemia -Home meds:  None  -LDL 103, goal < 70 -No statin at this time due to ICH and concern of CAA  Dysphagia -Secondary to stroke -SLP following -Cortrak in place -On Tube feeds and FW  Aspiration Pneumonia -Vomited in route w/EMS -Febrile: Tmax 101.2->afebrile  -Leukocytosis: WBC 19.7 -> 21.2 ->12.2->11.4->13.8-->15.1-->18.1-->19.3->18.5 -CXR bibasilar opacities -Completed 5 days of ceftriaxone  -Currently on vancomycin since 3/9.  Appreciate CCM           and pharmacy assistance. -Sputum culture (2/28): Negative -Aggressive pulmonary toilet on floor (IS q1 hour while           awake, flutter valve, pulm toilet q 4hrs)  Concern for MRSA bacteremia -Patient developed persistent fever and worsening                 leukocytosis 3/9, this prompted obtaining repeat blood          cultures and broadening IV antibiotics to include                     vancomycin  -Repeat blood cultures resulted positive for staph      epidermidis with methicillin resistance in aerobic            bottle  -S/p 5-day course of ceftriaxone with continued                      leukocytosis/fever -T-max 100.6 overnight, flat fever curve  -Leukocytosis:             15.1->18.4->19.3->19->18.5 -On Zosyn/Vanc -Repeat blood cultures are pending -PICC line removed 3/10 -Appreciate CCM management   Acute hypoxic respiratory failure -Likely related to aspiration, urgently intubated 3/4,       extubated 3/7-resolved.  -CCM had a conversation with family regarding re-       intubation-they need some time to think about goals of care.  We will continue full scope of care for now.  -Appreciate CCM assistance  Left arm and bilateral lower extremity swelling -Check Dopplers bilateral lower extremity and left upper extremity-reviewed-negative for DVT.  Other Stroke Risk Factors -Advanced Age >/= 15    PCCM to assume primary D/W Dr. [facility] day # 14   Attending Neurohospitalist Addendum Patient seen and examined with APP/Resident. Agree with the history and physical as documented above. Agree with the plan as documented, which I helped formulate. I have independently reviewed the chart, obtained history, review of systems and examined the patient.I have personally reviewed pertinent head/neck/spine imaging (CT/MRI). Please feel free to call with any questions.  -- 5/9, MD Stroke Neurology Pager: [phone]    CRITICAL CARE ATTESTATION Performed by: 5/9, MD Total critical care time: 33 minutes Critical care time was exclusive of separately billable procedures and treating other patients and/or supervising APPs/Residents/Students Critical care was necessary to treat or prevent imminent or life-threatening deterioration due to ICH This patient is critically ill and at significant risk for neurological worsening and/or death and  care requires constant monitoring. Critical care was  time spent personally by me on the following activities: development of treatment plan with patient and/or surrogate as well as nursing, discussions with consultants, evaluation of patient's response to treatment, examination of patient, obtaining history from patient or surrogate, ordering and performing treatments and interventions, ordering and review of laboratory studies, ordering and review of radiographic studies, pulse oximetry, re-evaluation of patient's condition, participation in multidisciplinary rounds and medical decision making of high complexity in the care of this patient.    To contact Stroke Continuity provider, please refer to [email or web address]. After hours, contact General Neurology

## 2020-06-29 NOTE — Progress Notes (Addendum)
NAME:  Calvin Yates, MRN:  [id number], DOB:  07/10/1934, LOS: 14 ADMISSION DATE:  06/15/2020, CONSULTATION DATE:  06/15/20 REFERRING MD:  Dr Iver Nestle - Neuro, CHIEF COMPLAINT: Acute encephalopathy due to ICH  Brief History:  86 year old male, LKW 2/25 1800 at dinner with wife, found down 1815 with SBP > 200, found to have 7cm R frontoparietal ICH without midline shift, L-sided deficits. PCCM consulted for worsening encephalopathy/concern for respiratory failure.  Significant Hospital Events:  2/25 - Admit 3/03 - Intubated 3/07 - Extubated to [facility] 3/08 - ?Seizure activity, STAT EEG per Neuro (unchanged from prior), LTM 3/09 - Persistent fever with new worsening leukocytosis, blood cultures repeated and vancomycin initiated  3/11 - Blood cultures positive for staph epidermidis with methicillin resistance in aerobic bottle only   Consults:  PCCM  Procedures:  Right PICC 3/1 > 3/10 ETT 3/3 >> 3/7  Significant Diagnostic Tests:   CT HEAD 2/25 > Right frontoparietal parenchymal hemorrhage measuring 7.6 cm. Sub-5 mm left temporal hemorrhage. No significant midline shift or ventricular entrapment. Right greater than left cerebral convexity subarachnoid blood products.  CXR 06/18/2020 Interval placement of feeding tube that extends below the diaphragm into the stomach. New bibasilar opacities may be consistent with atelectasis but could also be consistent with aspiration pneumonia given clinical history. No overt edema, pleural fluid or pneumothorax.  CT Head wo contrast 06/19/2020 Slight enlargement in the parenchymal hemorrhage in the right frontal parietal region as described. Minimal midline shift from right to left is noted. Stable small parenchymal hemorrhage in the left temporal lobe unchanged from the prior exam.   Micro Data:  RVP panel 2/25 >> Negative  MRSA PCR 2/25 >> Negative  Respiratory Cx 2/28 >> Normal flora  Blood Cx 2/28 >> negative   Antimicrobials:  Rocephin 2/28 >>  3/4 Zosyn 3/6 >>  Interim History / Subjective:  Tmax 100.6 overnight Blood cultures positive for staph epidermidis with methicillin resistance in aerobic bottle only   Objective   Blood pressure (!) 135/58, pulse 79, temperature 99.3 F (37.4 C), temperature source Axillary, resp. rate 20, height 5\' 8"  (1.727 m), weight 83.2 kg, SpO2 98 %.        Intake/Output Summary (Last 24 hours) at 06/29/2020 0730 Last data filed at 06/29/2020 08/29/2020 Gross per 24 hour  Intake 2109.28 ml  Output 1675 ml  Net 434.28 ml   Filed Weights   06/23/20 0500 06/24/20 0412 06/29/20 0215  Weight: 84.1 kg 81.2 kg 83.2 kg   Physical Examination: General: Chronically ill appearing elderly deconditioned male lying in bed, in NAD HEENT: [place]/AT, MM pink/moist, PERRL, Cortrack in place  Neuro: Opens eyes to verbal stimuli, able to follow some simple commands today  CV: s1s2 regular rate and rhythm, no murmur, rubs, or gallops,  PULM:  Clear to ascultation bilaterally, no added breath sounds, no increased work of breathing, oxygen sats appropriate on RA GI: soft, bowel sounds active in all 4 quadrants, non-tender, non-distended, tolerating TF Extremities: warm/dry, no edema  Skin: no rashes or lesions  Resolved Hospital Problem list   Concern for C-spine injury Hypophosphatemia Possible focal R seizure activity -Questionable focal RLE "twitching" noted 3/8PM. Neuro with recommendation for STAT EEG which was grossly unremarkable. EEG negative for seizure activity 3/9  Assessment & Plan:   Large intracranial hemorrhage and associated acute encephalopathy, left neglect  Concern for possible cerebral amyloid angiopathy (CAA) -Slight enlargement in the parenchymal hemorrhage with minimal R > L shift noted 3/1 on repeat CT  head. P: Management per neurology  Maintain neuro protective measures; goal for eurothermia, euglycemia, eunatermia, normoxia, and PCO2 goal of 35-40 Nutrition and bowel regiment  Seizure  precautions  Aspirations precautions  SBP goal < 160  Acute hypoxic respiratory failure requiring MV Aspiration pneumonia  -Per report, vomited en route to hospital. Respiratory culture with normal respiratory flora.  -Required urgent intubation overnight 3/4. Mental status improved and patient was successfully extubated 3/7. P: Head of bed elevated 30 degrees. Ensure adequate pulmonary hygiene  Follow cultures  Mobilize as able  Supportive care   Concern for MRSA bacteremia  -Patient developed persistent fever and worsening  leukocytosis 3/9, this prompted obtaining repeat blood cultures and broadening IV antibiotics to include vancomycin  -Repeat blood cultures resulted positive for staph epidermidis with methicillin resistance in aerobic bottle only on 3/11  P: Fever curve remains flat Leukocytosis slightly decreased  Continue vancomycin and zosyn  Continue to follow blood cultures  PICC line removed 3/11  Hypertensive emergency associated with intracranial hemorrhage> now controlled -SBP on EMS arrival reportedly > 200. Echo 2/27 LVEF 70 to 75% and grade 1 diastolic dysfunction. P: Continue PO amlodipine, coreg, and lisinopril  PRN IV antihypertensives   Hypergylcemia  -Controlled, has not been requiring insulin -Goal CBG 140-180 P: Continue to monitor   Acute anemia, likely due to acute illness P: Trend CBC Transfuse per protocol  Hgb goal < 7  Physical deconditioning P: Continue PT/OT/SLP efforts     PCCM will sign off. Thank you for the opportunity to participate in this patient's care. Please contact if we can be of further assistance.   Best practice (evaluated daily)  Diet: TFs Pain/Anxiety/Delirium protocol (if indicated): N/A VAP protocol (if indicated): N/A DVT prophylaxis: Lovenox GI prophylaxis: PPI Glucose control: SSI Mobility: Bedrest Disposition: Neuro ICU  Goals of Care:   Family Updates: Per primary; Dr. Merrily Pew briefly discussed code  status with family at bedside 3/8 (daughter, wife). Expressed that patient is currently doing ok, but in the event he decompensated asked family to consider whether or not patient would want to be reintubated as this would likely lead to a prolonged time on the ventilator and tracheostomy/PEG. Family wished to have some time to discuss.  Critical care time:   CRITICAL CARE Performed by: Delfin Gant   Total critical care time: 34 minutes  Critical care time was exclusive of separately billable procedures and treating other patients.  Critical care was necessary to treat or prevent imminent or life-threatening deterioration.  Critical care was time spent personally by me on the following activities: development of treatment plan with patient and/or surrogate as well as nursing, discussions with consultants, evaluation of patient's response to treatment, examination of patient, obtaining history from patient or surrogate, ordering and performing treatments and interventions, ordering and review of laboratory studies, ordering and review of radiographic studies, pulse oximetry and re-evaluation of patient's condition.  Delfin Gant, NP-C [facility] Personal contact information can be found on Amion  If no response please page: Adult pulmonary and critical care medicine pager on Amion unitl 7pm After 7pm please call [phone] 06/29/2020, 7:53 AM

## 2020-06-30 ENCOUNTER — Inpatient Hospital Stay (HOSPITAL_COMMUNITY): Payer: Medicare Other

## 2020-06-30 DIAGNOSIS — I611 Nontraumatic intracerebral hemorrhage in hemisphere, cortical: Secondary | ICD-10-CM | POA: Diagnosis not present

## 2020-06-30 DIAGNOSIS — R7881 Bacteremia: Secondary | ICD-10-CM

## 2020-06-30 DIAGNOSIS — R531 Weakness: Secondary | ICD-10-CM | POA: Diagnosis not present

## 2020-06-30 LAB — COMPREHENSIVE METABOLIC PANEL
ALT: 44 U/L (ref 0–44)
AST: 26 U/L (ref 15–41)
Albumin: 2.4 g/dL — ABNORMAL LOW (ref 3.5–5.0)
Alkaline Phosphatase: 52 U/L (ref 38–126)
Anion gap: 8 (ref 5–15)
BUN: 28 mg/dL — ABNORMAL HIGH (ref 8–23)
CO2: 19 mmol/L — ABNORMAL LOW (ref 22–32)
Calcium: 7.7 mg/dL — ABNORMAL LOW (ref 8.9–10.3)
Chloride: 112 mmol/L — ABNORMAL HIGH (ref 98–111)
Creatinine, Ser: 0.74 mg/dL (ref 0.61–1.24)
GFR, Estimated: >60 mL/min (ref >60)
Glucose, Bld: 163 mg/dL — ABNORMAL HIGH (ref 70–99)
Potassium: 4.3 mmol/L (ref 3.5–5.1)
Sodium: 139 mmol/L (ref 135–145)
Total Bilirubin: 0.6 mg/dL (ref 0.3–1.2)
Total Protein: 5.8 g/dL — ABNORMAL LOW (ref 6.5–8.1)

## 2020-06-30 LAB — CBC WITH DIFFERENTIAL/PLATELET
Abs Immature Granulocytes: 0.17 10*3/uL — ABNORMAL HIGH (ref 0.00–0.07)
Basophils Absolute: 0.1 10*3/uL (ref 0.0–0.1)
Basophils Relative: 0 %
Eosinophils Absolute: 0.2 10*3/uL (ref 0.0–0.5)
Eosinophils Relative: 1 %
HCT: 32.9 % — ABNORMAL LOW (ref 39.0–52.0)
Hemoglobin: 10.8 g/dL — ABNORMAL LOW (ref 13.0–17.0)
Immature Granulocytes: 1 %
Lymphocytes Relative: 8 %
Lymphs Abs: 1.6 10*3/uL (ref 0.7–4.0)
MCH: 32.3 pg (ref 26.0–34.0)
MCHC: 32.8 g/dL (ref 30.0–36.0)
MCV: 98.5 fL (ref 80.0–100.0)
Monocytes Absolute: 1.4 10*3/uL — ABNORMAL HIGH (ref 0.1–1.0)
Monocytes Relative: 7 %
Neutro Abs: 17.5 10*3/uL — ABNORMAL HIGH (ref 1.7–7.7)
Neutrophils Relative %: 83 %
Platelets: 263 10*3/uL (ref 150–400)
RBC: 3.34 MIL/uL — ABNORMAL LOW (ref 4.22–5.81)
RDW: 12.5 % (ref 11.5–15.5)
WBC: 20.9 10*3/uL — ABNORMAL HIGH (ref 4.0–10.5)
nRBC: 0 % (ref 0.0–0.2)

## 2020-06-30 LAB — ECHOCARDIOGRAM COMPLETE
AR max vel: 3.51 cm2
AV Peak grad: 11.6 mmHg
Ao pk vel: 1.7 m/s
Area-P 1/2: 2.6 cm2
Height: 68 in
S' Lateral: 2.7 cm
Weight: 2839.52 oz

## 2020-06-30 LAB — T4, FREE: Free T4: 0.88 ng/dL (ref 0.61–1.12)

## 2020-06-30 LAB — CULTURE, BLOOD (ROUTINE X 2)
Special Requests: ADEQUATE
Special Requests: ADEQUATE

## 2020-06-30 LAB — URINALYSIS, COMPLETE (UACMP) WITH MICROSCOPIC
Bacteria, UA: NONE SEEN
Bilirubin Urine: NEGATIVE
Glucose, UA: NEGATIVE mg/dL
Hgb urine dipstick: NEGATIVE
Ketones, ur: NEGATIVE mg/dL
Leukocytes,Ua: NEGATIVE
Nitrite: NEGATIVE
Protein, ur: NEGATIVE mg/dL
Specific Gravity, Urine: 1.018 (ref 1.005–1.030)
pH: 6 (ref 5.0–8.0)

## 2020-06-30 LAB — AMMONIA: Ammonia: 58 umol/L — ABNORMAL HIGH (ref 9–35)

## 2020-06-30 LAB — PHOSPHORUS: Phosphorus: 2.8 mg/dL (ref 2.5–4.6)

## 2020-06-30 LAB — MAGNESIUM: Magnesium: 2.3 mg/dL (ref 1.7–2.4)

## 2020-06-30 LAB — GLUCOSE, CAPILLARY: Glucose-Capillary: 125 mg/dL — ABNORMAL HIGH (ref 70–99)

## 2020-06-30 LAB — VITAMIN B12: Vitamin B-12: 521 pg/mL (ref 180–914)

## 2020-06-30 LAB — TSH: TSH: 1.512 u[IU]/mL (ref 0.350–4.500)

## 2020-06-30 NOTE — Progress Notes (Signed)
Pharmacy Antibiotic Note  Allah Reason is a 86 y.o. male admitted on 06/15/2020 with pneumonia.  Pharmacy has been consulted for vancomycin dosing with rising WBC.  Blood culture also growing MRSE, ?contaminant; PICC removed.  Renal function stable, Tmax 100.1, WBC down 18.5.  Plan: Vanc 1500mg  IV Q24H for AUC 497 using SCr 0.97 Monitor renal fxn, clinical progress, vanc levels soon   Height: 5\' 8"  (172.7 cm) Weight: 80.5 kg (177 lb 7.5 oz) IBW/kg (Calculated) : 68.4  Temp (24hrs), Avg:99 F (37.2 C), Min:98.2 F (36.8 C), Max:100.1 F (37.8 C)  Recent Labs  Lab 06/25/20 0754 06/26/20 0805 06/27/20 0318 06/28/20 0825 06/29/20 0108  WBC 15.1* 18.4* 19.3* 19.0* 18.5*  CREATININE 1.05 1.05 0.98 0.94 0.96    Estimated Creatinine Clearance: 55.4 mL/min (by C-G formula based on SCr of 0.96 mg/dL).    Allergies  Allergen Reactions  . Amoxicillin Rash    CTX 2/28 >> 3/3 Zosyn 3/6 >>3/11 Vanc 3/9 >>  2/25 MRSA PCR - negative 2/26 BCx - negative 2/28 TA - negative 3/6 BCx - negative 3/9 BCx - staph epi with MR 2/4  Lakeith Careaga D. 5/9, PharmD, BCPS, BCCCP 06/30/2020, 10:56 AM

## 2020-06-30 NOTE — Progress Notes (Signed)
Received from 4N ICU on bed by RN. Phong.  Eyes closed but answering yes/no questions.  Fecal containment tube intact and condom cath on.

## 2020-06-30 NOTE — Progress Notes (Signed)
[facility] Progress Note  Patient: Calvin FischerRobert Lee Yates    GNF:[id number]  DOA: 06/15/2020     Date of Service: the patient was seen and examined on 06/30/2020  Brief hospital course: No significant past medical history.  Presents with sudden onset of unresponsiveness. LKW 2/25 1800 at dinner with wife, found down 1815 with SBP > 200, found to have 7cm R frontoparietal ICH without midline shift, L-sided deficits. PCCM consulted for worsening encephalopathy/concern for respiratory failure. 2/25 - Admit 3/03 - Intubated 3/07 - Extubated to 4LNC 3/08 - ?Seizure activity, STAT EEG per Neuro (unchanged from prior), LTM 3/09 - Persistent fever with new worsening leukocytosis, blood cultures repeated and vancomycin initiated  3/11 - Blood cultures positive for staph epidermidis with methicillin resistance in aerobic bottle only   Currently plan is continue antibiotic, further work-up as well as monitor for improvement in encephalopathy.  Assessment and Plan: Large intracranial hemorrhage and associated acute encephalopathy, left neglect  Concern for possible cerebral amyloid angiopathy (CAA) -Slight enlargement in the parenchymal hemorrhage with minimal R > L shift noted 3/1 on repeat CT head.  Doyt Castellana initiated.  AVS available for this vomiting. Management per neurology  Maintain neuro protective measures; goal for eurothermia, euglycemia, eunatermia, normoxia, and PCO2 goal of 35-40 Seizure precautions  Aspirations precautions  SBP goal < 160. Per neurology prognosis is guarded given that the patient has shown no significant improvement over last few days.  Acute hypoxic respiratory failure requiring MV Aspiration pneumonia  -Per report, vomited en route to hospital. Respiratory culture with normal respiratory flora.  -Required urgent intubation overnight 3/4. Mental status improved and patient was successfully extubated 3/7. Head of bed elevated 30 degrees. Ensure adequate pulmonary  hygiene  Follow cultures  Mobilize as able  Supportive care   Staph epidermidis bacteremia. Concern for MRSA bacteremia-ruled out -Patient developed persistent fever and worsening  leukocytosis 3/9, this prompted obtaining repeat blood cultures and broadening IV antibiotics to include vancomycin  -Repeat blood cultures resulted positive for staph epidermidis with methicillin resistance in aerobic bottle only on 3/11  Fever curve remains flat Leukocytosis still present Continue vancomycin. Repeat blood cultures on 3/12. Also echocardiogram Doppler negative for vegetation or infection. PICC line removed 3/11  Hypertensive emergency associated with intracranial hemorrhage> now controlled -SBP on EMS arrival reportedly > 200. Echo 2/27 LVEF 70 to 75% and grade 1 diastolic dysfunction. Continue PO amlodipine, coreg, and lisinopril  PRN IV antihypertensives   Hypergylcemia  -Controlled, has not been requiring insulin -Goal CBG 140-180 Continue to monitor   Acute anemia, likely due to acute illness Trend CBC Transfuse per protocol  Hgb goal < 7  Physical deconditioning Continue PT/OT/SLP efforts   Possible focal R seizure activity Questionable focal RLE "twitching" noted 3/8PM. Neuro with recommendation for STAT EEG which was grossly unremarkable. EEG negative for seizure activity 3/9  Concern for C-spine injury-ruled out Hypophosphatemia-corrected  Body mass index is 26.98 kg/m.  Nutrition Problem: Inadequate oral intake Etiology: inability to eat Interventions: Interventions: Tube feeding,Prostat,MVI      Diet: NPO.  On tube feeding. DVT Prophylaxis:   enoxaparin (LOVENOX) injection 40 mg Start: 06/18/20 1045 Place and maintain sequential compression device Start: 06/15/20 2107 SCD's Start: 06/15/20 2059    Advance goals of care discussion: Full code  Family Communication: no family was present at bedside, at the time of interview.   Disposition:  Status  is: Inpatient  Remains inpatient appropriate because:Inpatient level of care appropriate due to severity of illness  Dispo: The patient is from: Home              Anticipated d/c is to: to be determined               Patient currently is not medically stable to d/c.   Difficult to place patient No  Subjective: No nausea no vomiting or no fever no chills were no acute events overnight.  Able to open up his eye, answers questions appropriately.  Physical Exam:  General: Appear in mild distress, no Rash; Oral Mucosa Clear, dry. no Abnormal Neck Mass Or lumps, Conjunctiva normal  Cardiovascular: S1 and S2 Present, no Murmur, Respiratory: increased respiratory effort, Bilateral Air entry present and fain bilateral  Crackles, no wheezes Abdomen: Bowel Sound present, Soft and no tenderness Extremities: no Pedal edema Neurology: alert but tired and oriented to person only affect appropriate. no new focal deficit Gait not checked due to patient safety concerns  Vitals:   06/30/20 1400 06/30/20 1600 06/30/20 1700 06/30/20 1809  BP: (!) 117/91 (!) 133/50 (!) 132/55 (!) 144/60  Pulse: 80 72 80 80  Resp:  (!) 22 (!) 24 18  Temp:    98.9 F (37.2 C)  TempSrc:    Oral  SpO2: 98% 97% 99% 96%  Weight:      Height:        Intake/Output Summary (Last 24 hours) at 06/30/2020 1936 Last data filed at 06/30/2020 1745 Gross per 24 hour  Intake 1450.75 ml  Output 1851 ml  Net -400.25 ml   Filed Weights   06/24/20 0412 06/29/20 0215 06/30/20 0500  Weight: 81.2 kg 83.2 kg 80.5 kg    Data Reviewed: I have personally reviewed and interpreted daily labs, tele strips, imaging. I reviewed all nursing notes, pharmacy notes, vitals, pertinent old records I have discussed plan of care as described above with RN and patient/family.  CBC: Recent Labs  Lab 06/25/20 0754 06/26/20 0805 06/27/20 0318 06/28/20 0825 06/29/20 0108 06/30/20 1207  WBC 15.1* 18.4* 19.3* 19.0* 18.5* 20.9*  NEUTROABS  11.2* 14.6*  --   --   --  17.5*  HGB 10.7* 10.5* 11.1* 10.4* 10.3* 10.8*  HCT 34.5* 33.6* 34.7* 32.6* 30.8* 32.9*  MCV 105.2* 101.8* 100.9* 101.6* 97.5 98.5  PLT 190 219 220 216 232 263   Basic Metabolic Panel: Recent Labs  Lab 06/26/20 0805 06/27/20 0318 06/28/20 0825 06/29/20 0108 06/30/20 1207  NA 146* 146* 141 142 139  K 3.9 4.0 3.8 4.0 4.3  CL 115* 115* 113* 114* 112*  CO2 23 22 23  21* 19*  GLUCOSE 148* 134* 165* 146* 163*  BUN 29* 29* 29* 32* 28*  CREATININE 1.05 0.98 0.94 0.96 0.74  CALCIUM 8.0* 8.1* 7.8* 7.6* 7.7*  MG  --  2.4  --   --  2.3  PHOS  --  3.4  --   --  2.8    Studies: ECHOCARDIOGRAM COMPLETE  Result Date: 06/30/2020    ECHOCARDIOGRAM REPORT   Patient Name:   CLEMON DEVAUL Date of Exam: 06/30/2020 Medical Rec #:  08/30/2020       Height:       68.0 in Accession #:    [id number]      Weight:       177.5 lb Date of Birth:  1934/07/08       BSA:          1.943 m Patient Age:    56 years  BP:           117/91 mmHg Patient Gender: M               HR:           80 bpm. Exam Location:  Inpatient Procedure: 2D Echo, Cardiac Doppler and Color Doppler Indications:    Bacteremia  History:        Patient has prior history of Echocardiogram examinations, most                 recent 06/17/2020. Signs/Symptoms:Bacteremia; Risk                 Factors:Hypertension and Dyslipidemia. Right front hemorrhage.  Sonographer:    Lavenia Atlas Referring Phys: [id number] [facility] M Keveon Amsler  Sonographer Comments: Patient unable to cooperate IMPRESSIONS  1. Left ventricular ejection fraction, by estimation, is 60 to 65%. The left ventricle has normal function. The left ventricle has no regional wall motion abnormalities. There is mild left ventricular hypertrophy. Left ventricular diastolic parameters are consistent with Grade I diastolic dysfunction (impaired relaxation).  2. Right ventricular systolic function is normal. The right ventricular size is normal. There is normal pulmonary  artery systolic pressure. The estimated right ventricular systolic pressure is 33.9 mmHg.  3. The mitral valve is normal in structure. Trivial mitral valve regurgitation. No evidence of mitral stenosis.  4. The aortic valve is grossly normal. There is mild calcification of the aortic valve. Aortic valve regurgitation is trivial. No aortic stenosis is present.  5. The inferior vena cava is normal in size with greater than 50% respiratory variability, suggesting right atrial pressure of 3 mmHg. Conclusion(s)/Recommendation(s): No evidence of valvular vegetations on this transthoracic echocardiogram. FINDINGS  Left Ventricle: Left ventricular ejection fraction, by estimation, is 60 to 65%. The left ventricle has normal function. The left ventricle has no regional wall motion abnormalities. The left ventricular internal cavity size was normal in size. There is  mild left ventricular hypertrophy. Left ventricular diastolic parameters are consistent with Grade I diastolic dysfunction (impaired relaxation). Right Ventricle: The right ventricular size is normal. No increase in right ventricular wall thickness. Right ventricular systolic function is normal. There is normal pulmonary artery systolic pressure. The tricuspid regurgitant velocity is 2.78 m/s, and  with an assumed right atrial pressure of 3 mmHg, the estimated right ventricular systolic pressure is 33.9 mmHg. Left Atrium: Left atrial size was normal in size. Right Atrium: Right atrial size was normal in size. Pericardium: There is no evidence of pericardial effusion. Mitral Valve: The mitral valve is normal in structure. Mild mitral annular calcification. Trivial mitral valve regurgitation. No evidence of mitral valve stenosis. Tricuspid Valve: The tricuspid valve is normal in structure. Tricuspid valve regurgitation is not demonstrated. No evidence of tricuspid stenosis. Aortic Valve: The aortic valve is grossly normal. There is mild calcification of the aortic  valve. Aortic valve regurgitation is trivial. No aortic stenosis is present. Aortic valve peak gradient measures 11.6 mmHg. Pulmonic Valve: The pulmonic valve was normal in structure. Pulmonic valve regurgitation is trivial. No evidence of pulmonic stenosis. Aorta: The aortic root is normal in size and structure. Venous: The inferior vena cava is normal in size with greater than 50% respiratory variability, suggesting right atrial pressure of 3 mmHg. IAS/Shunts: No atrial level shunt detected by color flow Doppler.  LEFT VENTRICLE PLAX 2D LVIDd:         4.40 cm  Diastology LVIDs:         2.70  cm  LV e' medial:    5.00 cm/s LV PW:         1.30 cm  LV E/e' medial:  18.5 LV IVS:        1.20 cm  LV e' lateral:   6.64 cm/s LVOT diam:     2.40 cm  LV E/e' lateral: 13.9 LV SV:         122 LV SV Index:   63 LVOT Area:     4.52 cm  RIGHT VENTRICLE RV Basal diam:  2.50 cm RV S prime:     18.60 cm/s TAPSE (M-mode): 2.6 cm LEFT ATRIUM           Index       RIGHT ATRIUM           Index LA diam:      3.40 cm 1.75 cm/m  RA Area:     13.70 cm LA Vol (A2C): 44.2 ml 22.75 ml/m RA Volume:   28.20 ml  14.52 ml/m LA Vol (A4C): 49.5 ml 25.48 ml/m  AORTIC VALVE AV Area (Vmax): 3.51 cm AV Vmax:        170.00 cm/s AV Peak Grad:   11.6 mmHg LVOT Vmax:      132.00 cm/s LVOT Vmean:     78.500 cm/s LVOT VTI:       0.270 m  AORTA Ao Root diam: 3.10 cm MITRAL VALVE                TRICUSPID VALVE MV Area (PHT): 2.60 cm     TR Peak grad:   30.9 mmHg MV Decel Time: 292 msec     TR Vmax:        278.00 cm/s MV E velocity: 92.40 cm/s MV A velocity: 118.00 cm/s  SHUNTS MV E/A ratio:  0.78         Systemic VTI:  0.27 m                             Systemic Diam: 2.40 cm Weston Brass MD Electronically signed by Weston Brass MD Signature Date/Time: 06/30/2020/4:12:57 PM    Final     Scheduled Meds: . amLODipine  10 mg Per Tube Daily  . carvedilol  25 mg Per Tube BID  . chlorhexidine  15 mL Mouth Rinse BID  . Chlorhexidine Gluconate Cloth   6 each Topical Daily  . enoxaparin (LOVENOX) injection  40 mg Subcutaneous Daily  . feeding supplement (PROSource TF)  45 mL Per Tube BID  . free water  200 mL Per Tube Q6H  . guaiFENesin  5 mL Per Tube Q12H  . lisinopril  5 mg Per Tube Daily  . mouth rinse  15 mL Mouth Rinse q12n4p  . multivitamin with minerals  1 tablet Per Tube Daily  . pantoprazole sodium  40 mg Per Tube QHS  . senna-docusate  1 tablet Per Tube BID  . sodium chloride flush  10-40 mL Intracatheter Q12H   Continuous Infusions: . sodium chloride 10 mL/hr at 06/30/20 0400  . feeding supplement (OSMOLITE 1.5 CAL) 1,000 mL (06/29/20 2349)  . vancomycin 150 mL/hr at 06/30/20 1400   PRN Meds: sodium chloride, acetaminophen **OR** acetaminophen (TYLENOL) oral liquid 160 mg/5 mL **OR** acetaminophen, bisacodyl, hydrALAZINE, labetalol, ondansetron (ZOFRAN) IV, sodium chloride flush  Time spent: 35 minutes  Author: Lynden Oxford, MD [facility] 06/30/2020 7:36 PM  To reach On-call, see care teams to locate  the attending and reach out via [email or web address]. Between 7PM-7AM, please contact night-coverage If you still have difficulty reaching the attending provider, please page the [facility] (Director on Call) for [facility] on amion for assistance.

## 2020-06-30 NOTE — Progress Notes (Signed)
STROKE TEAM PROGRESS NOTE   STROKE TEAM PROGRESS NOTE   INTERVAL HISTORY Transferred to PCCM as primary service yesterday One blood culture positive for resistant staph epidermis prompting broadening of IV antibiotics 06/28/20. Tmax 100.1 Resp status stable post extubation 3/9. On room air with oxygen saturations wnl. Stable neurologic exam.  Blood pressure adequately controlled.  Vital signs stable.   Vitals:   06/30/20 0500 06/30/20 0600 06/30/20 0700 06/30/20 0800  BP:  (!) 147/58 (!) 159/68 (!) 169/75  Pulse:  80 85 89  Resp:  (!) 24 18 17   Temp:      TempSrc:      SpO2:  100% 91% 96%  Weight: 80.5 kg     Height:       CBC:  Recent Labs  Lab 06/25/20 0754 06/26/20 0805 06/27/20 0318 06/28/20 0825 06/29/20 0108  WBC 15.1* 18.4*   < > 19.0* 18.5*  NEUTROABS 11.2* 14.6*  --   --   --   HGB 10.7* 10.5*   < > 10.4* 10.3*  HCT 34.5* 33.6*   < > 32.6* 30.8*  MCV 105.2* 101.8*   < > 101.6* 97.5  PLT 190 219   < > 216 232   < > = values in this interval not displayed.   Basic Metabolic Panel:  Recent Labs  Lab 06/27/20 0318 06/28/20 0825 06/29/20 0108  NA 146* 141 142  K 4.0 3.8 4.0  CL 115* 113* 114*  CO2 22 23 21*  GLUCOSE 134* 165* 146*  BUN 29* 29* 32*  CREATININE 0.98 0.94 0.96  CALCIUM 8.1* 7.8* 7.6*  MG 2.4  --   --   PHOS 3.4  --   --    Lipid Panel:  No results for input(s): CHOL, TRIG, HDL, CHOLHDL, VLDL, LDLCALC in the last 168 hours. HgbA1c:  No results for input(s): HGBA1C in the last 168 hours. Urine Drug Screen:  No results for input(s): LABOPIA, COCAINSCRNUR, LABBENZ, AMPHETMU, THCU, LABBARB in the last 168 hours.  Alcohol Level No results for input(s): ETH in the last 168 hours.  IMAGING past 24 hours ECHOCARDIOGRAM COMPLETE  Result Date: 06/17/2020 IMPRESSIONS   1. Left ventricular ejection fraction, by estimation, is 70 to 75%. The left ventricle has hyperdynamic function. The left ventricle has no regional wall motion abnormalities. Left  ventricular diastolic parameters are consistent with Grade I diastolic dysfunction (impaired relaxation).   2. Right ventricular systolic function is normal. The right ventricular size is normal. Tricuspid regurgitation signal is inadequate for assessing PA pressure.   3. The mitral valve is normal in structure. Trivial mitral valve regurgitation. No evidence of mitral stenosis.   4. The aortic valve is tricuspid. Aortic valve regurgitation is not visualized. Mild aortic valve sclerosis is present, with no evidence of aortic valve stenosis.   5. The inferior vena cava is normal in size with greater than 50% respiratory variability, suggesting right atrial pressure of 3 mmHg.    IMAGING past 24 hours MR BRAIN W WO CONTRAST  Result Date: 06/15/2020 CLINICAL DATA:   IMPRESSION: 1. Innumerable foci of hemosiderin deposition scattered throughout the brain consistent with previous hemorrhagic infarctions. Intraparenchymal hematoma at the right frontoparietal junction with internal clot retraction, measuring 7.8 x 4.7 x 4.6 cm. No abnormal enhancement to suggest that this represents a hemorrhagic mass. Small hemorrhage in the left temporal lobe cannot be specifically identified by MRI as different than the other foci of hemosiderin deposition. 2. Small amount of subarachnoid hemorrhage within  the sulci. 3. Mild mass-effect upon the right lateral ventricle with right-to-left shift 3 mm. Chronic small-vessel ischemic changes of the white matter elsewhere. The overall pattern could either be due to amyloid angiopathy or hypertensive vascular disease. 4. No finding to suggest the presence metastatic disease. Electronically Signed   By: Paulina Fusi M.D.   On: 06/15/2020 22:56   DG CHEST PORT 1 VIEW  Result Date: 06/15/2020 CLINICAL DATA:  Unresponsive. EXAM: PORTABLE CHEST 1 VIEW COMPARISON:  None. FINDINGS: The heart size and mediastinal contours are within normal limits. Both lungs are clear. The visualized  skeletal structures are unremarkable. IMPRESSION: No active disease. Electronically Signed   By: Lupita Raider M.D.   On: 06/15/2020 20:29   CT HEAD CODE STROKE WO CONTRAST  Result Date: 06/15/2020 IMPRESSION:  1. Right frontoparietal parenchymal hemorrhage measuring 7.6 cm. Sub-5 mm left temporal hemorrhage.  2. No significant midline shift or ventricular entrapment.  3. Right greater than left cerebral convexity subarachnoid blood products.  4. ASPECTS is 10.    Current Facility-Administered Medications:  .  0.9 %  sodium chloride infusion, , Intravenous, PRN, Micki Riley, MD, Last Rate: 10 mL/hr at 06/30/20 0400, Infusion Verify at 06/30/20 0400 .  acetaminophen (TYLENOL) tablet 650 mg, 650 mg, Oral, Q4H PRN, 650 mg at 06/24/20 1628 **OR** acetaminophen (TYLENOL) 160 MG/5ML solution 650 mg, 650 mg, Per Tube, Q4H PRN, 650 mg at 06/29/20 0203 **OR** acetaminophen (TYLENOL) suppository 650 mg, 650 mg, Rectal, Q4H PRN, Bhagat, Srishti L, MD, 650 mg at 06/17/20 0023 .  amLODipine (NORVASC) tablet 10 mg, 10 mg, Per Tube, Daily, Charlott Holler, MD, 10 mg at 06/30/20 0905 .  bisacodyl (DULCOLAX) suppository 10 mg, 10 mg, Rectal, Daily PRN, Olivencia-Simmons, Ivelisse, NP .  carvedilol (COREG) tablet 25 mg, 25 mg, Per Tube, BID, Charlott Holler, MD, 25 mg at 06/30/20 0847 .  chlorhexidine (PERIDEX) 0.12 % solution 15 mL, 15 mL, Mouth Rinse, BID, Milon Dikes, MD, 15 mL at 06/30/20 0904 .  Chlorhexidine Gluconate Cloth 2 % PADS 6 each, 6 each, Topical, Daily, Bhagat, Srishti L, MD, 6 each at 06/30/20 0905 .  enoxaparin (LOVENOX) injection 40 mg, 40 mg, Subcutaneous, Daily, Micki Riley, MD, 40 mg at 06/30/20 0905 .  feeding supplement (OSMOLITE 1.5 CAL) liquid 1,000 mL, 1,000 mL, Per Tube, Continuous, Micki Riley, MD, Last Rate: 55 mL/hr at 06/29/20 2349, 1,000 mL at 06/29/20 2349 .  feeding supplement (PROSource TF) liquid 45 mL, 45 mL, Per Tube, BID, Micki Riley, MD, 45 mL at  06/30/20 0904 .  free water 200 mL, 200 mL, Per Tube, Q6H, Cloyd Stagers M, PA-C, 200 mL at 06/30/20 0551 .  guaiFENesin (ROBITUSSIN) 100 MG/5ML solution 100 mg, 5 mL, Per Tube, Q12H, Bevelyn Ngo, NP, 100 mg at 06/30/20 0847 .  hydrALAZINE (APRESOLINE) injection 20 mg, 20 mg, Intravenous, Q6H PRN, Olivencia-Simmons, Ivelisse, NP, 20 mg at 06/21/20 2233 .  labetalol (NORMODYNE) injection 5-20 mg, 5-20 mg, Intravenous, Q2H PRN, Milon Dikes, MD, 5 mg at 06/26/20 0540 .  lisinopril (ZESTRIL) tablet 5 mg, 5 mg, Per Tube, Daily, Milon Dikes, MD, 5 mg at 06/30/20 0905 .  MEDLINE mouth rinse, 15 mL, Mouth Rinse, q12n4p, Milon Dikes, MD, 15 mL at 06/29/20 1609 .  multivitamin with minerals tablet 1 tablet, 1 tablet, Per Tube, Daily, Micki Riley, MD, 1 tablet at 06/30/20 0905 .  ondansetron (ZOFRAN) injection 4 mg, 4 mg, Intravenous, Q6H PRN, Bhagat,  Srishti L, MD, 4 mg at 06/16/20 1014 .  pantoprazole sodium (PROTONIX) 40 mg/20 mL oral suspension 40 mg, 40 mg, Per Tube, QHS, Micki Riley, MD, 40 mg at 06/29/20 2113 .  senna-docusate (Senokot-S) tablet 1 tablet, 1 tablet, Per Tube, BID, Micki Riley, MD, 1 tablet at 06/28/20 2206 .  sodium chloride flush (NS) 0.9 % injection 10-40 mL, 10-40 mL, Intracatheter, Q12H, Micki Riley, MD, 10 mL at 06/30/20 0905 .  sodium chloride flush (NS) 0.9 % injection 10-40 mL, 10-40 mL, Intracatheter, PRN, Micki Riley, MD .  vancomycin (VANCOREADY) IVPB 1500 mg/300 mL, 1,500 mg, Intravenous, Q24H, von Pearletha Furl, RPH, Stopped at 06/29/20 1520   PHYSICAL EXAM  Temp:  [98.2 F (36.8 C)-100.1 F (37.8 C)] 98.2 F (36.8 C) (03/12 0326) Pulse Rate:  [67-89] 89 (03/12 0800) Resp:  [17-29] 17 (03/12 0800) BP: (112-169)/(47-75) 169/75 (03/12 0800) SpO2:  [91 %-100 %] 96 % (03/12 0800) Weight:  [80.5 kg] 80.5 kg (03/12 0500) General: Well-developed well-nourished, resting in bed with eyes closed in NAD.  HEENT: Normocephalic, atraumatic,  NG tube in place CVS: Regular rate rhythm Respiratory: Respiration even and unlabored\ GI: Abd soft, ND, NTTP  Neurological Exam : Patient is sleepy.  Keeps eyes closed unless stimulated. Opens eyes briefly to verbal stimulation. Speech remains dysarthric with soft voice.  Oriented x4. Names 2/2 objects.  Has poor attention concentration and neglects the left hemibody.  Cranial nerves: Pupils are equal round reactive to light, blinks to threat from right but not from the left, does not cross midline to the left-has right gaze preference, left lower facial weakness also seen.  Motor exam: Right upper and lower extremity are 4+/5.  Left upper extremity is nearly flaccid    Left lower extremity with a little bit withdrawal   Sensory: Grimaces to noxious stimulation all over, without any significant difference noted between the grimace to noxious stimulation on the right versus left. Coordination: Difficult to assess given his mentation  ASSESSMENT/PLAN  86 year old male with very minimal past medical history. Ate dinner with his wife at 1730 and having a conversationtill 1800 hrs. Abruptly at 1815 was found to be down. EMS on arrival found his blood pressure to be high systolic 200s with slurred speech and left sided weakness. He had an episode of emesis on route and was transiently hypoxemic.   Remains extubated and neurologically and hemodynamically stable awaiting transfer to floor bed with telemetry.  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  -CTA head & neck stable ICH, Mild to moderate right M2, right ICA terminus, distal basilar artery and bilateral PCA narrowing. -MRI stable hematoma, no underlying hemorrhagic mets. Likely CAA vs hypertensive changes. Chronic hemosiderin deposits from prior ICHs . -2D  Echo: EF 70-75%, Grade I diastolic dysfunction -LDL 103 -HgbA1c 5.6 -VTE prophylaxis - Lovenox  daily -No AC/AP prior to admission, now no AC/AP due to ICH and likely CAA -Therapy recommendations:  CIR -Disposition:  pending   Cerebral Edema -Was on HTS. Off 3% saline now -Na now 146 -Allow Na gradually trending down with continuing FW flushes  Concern for seizure -LTM negative for seizures.  Do not see a need for antiepileptics.  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 The overall pattern could either be due to amyloid angiopathy or hypertensive vascular disease. -No antiplatelets or anticoagulation. -Long term BP goal < 140   Hypertension -Home meds: none, no hx of HTN but with BP 200s on EMS arrival -Currently on Norvasc 10mg  daily, coreg 25mg  bid - added Lisinopril 5mg  daily 3/10. -off cleviprex infusion -Labetalol 20mg  q2hr prn, hydralazine 20mg  q6hr prn -Long-term BP goal < 140 due to CAA.   Hyperlipidemia -Home meds:  None  -LDL 103, goal < 70 -No statin at this time due to ICH and concern of CAA  Dysphagia -Secondary to stroke -SLP following -Cortrak in place -On Tube feeds and FW  Aspiration Pneumonia -Vomited in route w/EMS -Febrile: Tmax 101.2->afebrile  -Leukocytosis: WBC 19.7 -> 21.2 ->12.2->11.4->13.8-->15.1-->18.1-->19.3->18.5 ->pending -CXR bibasilar opacities -Completed 5 days of ceftriaxone  -Currently on vancomycin since 3/9.  Appreciate CCM           and pharmacy assistance. -Sputum culture (2/28): Negative -Aggressive pulmonary toilet on floor (IS q1 hour while           awake, flutter valve, pulm toilet q 4hrs)  Concern for MRSA bacteremia -Patient developed persistent fever and worsening                 leukocytosis 3/9,  this prompted obtaining repeat blood          cultures and broadening IV antibiotics to include                    vancomycin  -Repeat blood cultures resulted positive for staph      epidermidis with methicillin resistance in aerobic            bottle  -S/p 5-day course of ceftriaxone with continued                      leukocytosis/fever -T-max 100.6 overnight, flat fever curve  -Leukocytosis:             15.1->18.4->19.3->19->18.5->pending -On Zosyn/Vanc -Repeat blood cultures are pending -PICC line removed 3/10 -Appreciate CCM management   Acute hypoxic respiratory failure -Likely related to aspiration, urgently intubated 3/4,       extubated 3/7-resolved.  -CCM had a conversation with family regarding re-       intubation-they need some time to think about goals of care.  We will continue full scope of care for now.  -Appreciate CCM assistance  Left arm and bilateral lower extremity swelling -Check Dopplers bilateral lower extremity and left upper extremity-reviewed-negative for DVT.  Other Stroke Risk Factors -Advanced Age >/= 1965    Hospital day # 15   I have personally obtained history,examined this patient, reviewed notes, independently viewed imaging studies, participated in medical decision making and plan of care.ROS completed by me personally and pertinent positives fully documented  I have made any additions or clarifications directly to the above note. Agree with note above.  Patient is now transferred to medical hospitalist team.  Medically stable to be transferred to floor bed when available.  Discussed with Dr. Allena KatzPatel.This patient is critically ill and at significant risk of neurological worsening, death and care requires constant monitoring of vital signs, hemodynamics,respiratory and cardiac monitoring, extensive review of multiple databases, frequent neurological assessment, discussion with family, other specialists and medical decision making of high complexity.I have made  any additions or clarifications directly to the above note.This  critical care time does not reflect procedure time, or teaching time or supervisory time of PA/NP/Med Resident etc but could involve care discussion time.  I spent 30 minutes of neurocritical care time  in the care of  this patient.  Delia Heady, MD   Delia Heady, MD Medical Director [facility] Pager: [phone] 06/30/2020 1:51 PM    To contact Stroke Continuity provider, please refer to [email or web address]. After hours, contact General Neurology

## 2020-06-30 NOTE — Progress Notes (Signed)
  Echocardiogram 2D Echocardiogram has been performed.  Calvin Yates 06/30/2020, 3:08 PM

## 2020-07-01 ENCOUNTER — Inpatient Hospital Stay (HOSPITAL_COMMUNITY): Payer: Medicare Other

## 2020-07-01 DIAGNOSIS — Z66 Do not resuscitate: Secondary | ICD-10-CM | POA: Diagnosis not present

## 2020-07-01 DIAGNOSIS — T17908A Unspecified foreign body in respiratory tract, part unspecified causing other injury, initial encounter: Secondary | ICD-10-CM | POA: Diagnosis not present

## 2020-07-01 DIAGNOSIS — I611 Nontraumatic intracerebral hemorrhage in hemisphere, cortical: Secondary | ICD-10-CM | POA: Diagnosis not present

## 2020-07-01 DIAGNOSIS — R531 Weakness: Secondary | ICD-10-CM | POA: Diagnosis not present

## 2020-07-01 DIAGNOSIS — R069 Unspecified abnormalities of breathing: Secondary | ICD-10-CM | POA: Diagnosis not present

## 2020-07-01 LAB — BASIC METABOLIC PANEL
Anion gap: 4 — ABNORMAL LOW (ref 5–15)
BUN: 28 mg/dL — ABNORMAL HIGH (ref 8–23)
CO2: 22 mmol/L (ref 22–32)
Calcium: 7.6 mg/dL — ABNORMAL LOW (ref 8.9–10.3)
Chloride: 111 mmol/L (ref 98–111)
Creatinine, Ser: 0.86 mg/dL (ref 0.61–1.24)
GFR, Estimated: >60 mL/min (ref >60)
Glucose, Bld: 145 mg/dL — ABNORMAL HIGH (ref 70–99)
Potassium: 4.1 mmol/L (ref 3.5–5.1)
Sodium: 137 mmol/L (ref 135–145)

## 2020-07-01 LAB — VANCOMYCIN, PEAK: Vancomycin Pk: 34 ug/mL (ref 30–40)

## 2020-07-01 LAB — GLUCOSE, CAPILLARY
Glucose-Capillary: 131 mg/dL — ABNORMAL HIGH (ref 70–99)
Glucose-Capillary: 132 mg/dL — ABNORMAL HIGH (ref 70–99)
Glucose-Capillary: 140 mg/dL — ABNORMAL HIGH (ref 70–99)
Glucose-Capillary: 161 mg/dL — ABNORMAL HIGH (ref 70–99)
Glucose-Capillary: 165 mg/dL — ABNORMAL HIGH (ref 70–99)
Glucose-Capillary: 167 mg/dL — ABNORMAL HIGH (ref 70–99)

## 2020-07-01 LAB — CBC
HCT: 31.2 % — ABNORMAL LOW (ref 39.0–52.0)
Hemoglobin: 10.7 g/dL — ABNORMAL LOW (ref 13.0–17.0)
MCH: 33.2 pg (ref 26.0–34.0)
MCHC: 34.3 g/dL (ref 30.0–36.0)
MCV: 96.9 fL (ref 80.0–100.0)
Platelets: 288 10*3/uL (ref 150–400)
RBC: 3.22 MIL/uL — ABNORMAL LOW (ref 4.22–5.81)
RDW: 12.8 % (ref 11.5–15.5)
WBC: 18.2 10*3/uL — ABNORMAL HIGH (ref 4.0–10.5)
nRBC: 0 % (ref 0.0–0.2)

## 2020-07-01 LAB — MAGNESIUM: Magnesium: 2.5 mg/dL — ABNORMAL HIGH (ref 1.7–2.4)

## 2020-07-01 NOTE — Progress Notes (Signed)
[facility] Progress Note  Patient: Calvin Yates    SAY:[id number]  DOA: 06/15/2020     Date of Service: the patient was seen and examined on 07/01/2020  Brief hospital course: No significant past medical history.  Presents with sudden onset of unresponsiveness. LKW 2/25 1800 at dinner with wife, found down 1815 with SBP > 200, found to have 7cm R frontoparietal ICH without midline shift, L-sided deficits. PCCM consulted for worsening encephalopathy/concern for respiratory failure. 2/25 - Admit 3/03 - Intubated 3/07 - Extubated to 4LNC 3/08 - ?Seizure activity, STAT EEG per Neuro (unchanged from prior), LTM 3/09 - Persistent fever with new worsening leukocytosis, blood cultures repeated and vancomycin initiated  3/11 - Blood cultures positive for staph epidermidis with methicillin resistance in aerobic bottle only  Currently plan is continue antibiotic, further work-up as well as monitor for improvement in encephalopathy.   Assessment and Plan: Large intracranial hemorrhage and associated acute encephalopathy, left neglect  Concern for possible cerebral amyloid angiopathy (CAA) Management per neurology  Maintain neuro protective measures; goal for eurothermia, euglycemia, eunatermia, normoxia, and PCO2 goal of 35-40 Seizure precautions  Aspirations precautions  SBP goal < 160. Per neurology prognosis is guarded given that the patient has shown no significant improvement over last few days.  Acute hypoxic respiratory failure requiring MV Aspiration pneumonia Persistent fever. Per report, vomited en route to hospital. Respiratory culture with normal respiratory flora.  Required urgent intubation overnight 3/4. Mental status improved and patient was successfully extubated 3/7. Head of bed elevated 30 degrees. Ensure adequate pulmonary hygiene  Follow cultures we will consult ID tomorrow.  Staph epidermidis bacteremia. Concern for MRSA bacteremia-ruled out Patient developed  persistent fever and worsening  leukocytosis 3/9, this prompted obtaining repeat blood cultures and broadening IV antibiotics to include vancomycin  Repeat blood cultures resulted positive for staph epidermidis with methicillin resistance in aerobic bottle only on 3/11  Fever curve remains flat Leukocytosis still present Continue vancomycin. Repeat blood cultures on 3/12.  Febrile on 3/13. Also echocardiogram Doppler negative for vegetation or infection. PICC line removed 3/11  Hypertensive emergency associated with intracranial hemorrhage> now controlled SBP on EMS arrival reportedly > 200. Echo 2/27 LVEF 70 to 75% and grade 1 diastolic dysfunction. Continue PO amlodipine, coreg, and lisinopril  PRN IV antihypertensives   Hypergylcemia  Controlled, has not been requiring insulin Goal CBG 140-180 Continue to monitor   Acute anemia, likely due to acute illness Trend CBC Transfuse per protocol  Hgb goal < 7  Physical deconditioning Continue PT/OT/SLP efforts   Possible focal R seizure activity Questionable focal RLE "twitching" noted 3/8PM. Neuro with recommendation for STAT EEG which was grossly unremarkable. EEG negative for seizure activity 3/9  Advance goals of care discussion: DNR/DNI.  Discussed with daughter on the phone regarding patient's poor prognosis.  Palliative care was consulted.  Family meeting on Tuesday. Appreciate assistance from palliative care.  Concern for C-spine injury-ruled out Hypophosphatemia-corrected  Body mass index is 27.39 kg/m.  Nutrition Problem: Inadequate oral intake Etiology: inability to eat Interventions: Interventions: Tube feeding,Prostat,MVI  Diet: NPO.  On tube feeding. DVT Prophylaxis:   enoxaparin (LOVENOX) injection 40 mg Start: 06/18/20 1045 Place and maintain sequential compression device Start: 06/15/20 2107 SCD's Start: 06/15/20 2059  Family Communication: no family was present at bedside, at the time of interview.   Discussed with daughter on the phone.  Disposition:  Status is: Inpatient  Remains inpatient appropriate because:Inpatient level of care appropriate due to severity of illness  Dispo: The patient is from: Home              Anticipated d/c is to: to be determined               Patient currently is not medically stable to d/c.   Difficult to place patient No  Subjective: No nausea no vomiting or no fever no chills were no acute events overnight.  Able to open up his eye, answers questions appropriately.  Physical Exam:  General: Appear in mild distress, no Rash; Oral Mucosa Clear, moist. no Abnormal Neck Mass Or lumps, Conjunctiva normal  Cardiovascular: S1 and S2 Present, no Murmur, Respiratory: Increased respiratory effort, Bilateral Air entry present and CTA, no Crackles, no wheezes Abdomen: Bowel Sound present, Soft and no tenderness Extremities: no Pedal edema Neurology: Not alert, lethargic, not oriented. Unable to follow any commands today. Gait not checked due to patient safety concerns  Vitals:   07/01/20 1325 07/01/20 1536 07/01/20 1619 07/01/20 1938  BP:  132/70 (!) 125/48 (!) 122/58  Pulse:  82 80 81  Resp:  (!) 24 (!) 22 (!) 21  Temp: 99.4 F (37.4 C) 98.9 F (37.2 C) 98.6 F (37 C) 98.2 F (36.8 C)  TempSrc: Oral Oral Oral Oral  SpO2:  97% 96% 96%  Weight:      Height:        Intake/Output Summary (Last 24 hours) at 07/01/2020 2024 Last data filed at 07/01/2020 1344 Gross per 24 hour  Intake 560 ml  Output 1000 ml  Net -440 ml   Filed Weights   06/29/20 0215 06/30/20 0500 07/01/20 0500  Weight: 83.2 kg 80.5 kg 81.7 kg    Data Reviewed: I have personally reviewed and interpreted daily labs, tele strips, imaging. I reviewed all nursing notes, pharmacy notes, vitals, pertinent old records I have discussed plan of care as described above with RN and patient/family.  CBC: Recent Labs  Lab 06/25/20 0754 06/26/20 0805 06/27/20 0318 06/28/20 0825  06/29/20 0108 06/30/20 1207 07/01/20 0412  WBC 15.1* 18.4* 19.3* 19.0* 18.5* 20.9* 18.2*  NEUTROABS 11.2* 14.6*  --   --   --  17.5*  --   HGB 10.7* 10.5* 11.1* 10.4* 10.3* 10.8* 10.7*  HCT 34.5* 33.6* 34.7* 32.6* 30.8* 32.9* 31.2*  MCV 105.2* 101.8* 100.9* 101.6* 97.5 98.5 96.9  PLT 190 219 220 216 232 263 288   Basic Metabolic Panel: Recent Labs  Lab 06/27/20 0318 06/28/20 0825 06/29/20 0108 06/30/20 1207 07/01/20 0412  NA 146* 141 142 139 137  K 4.0 3.8 4.0 4.3 4.1  CL 115* 113* 114* 112* 111  CO2 22 23 21* 19* 22  GLUCOSE 134* 165* 146* 163* 145*  BUN 29* 29* 32* 28* 28*  CREATININE 0.98 0.94 0.96 0.74 0.86  CALCIUM 8.1* 7.8* 7.6* 7.7* 7.6*  MG 2.4  --   --  2.3 2.5*  PHOS 3.4  --   --  2.8  --     Studies: DG CHEST PORT 1 VIEW  Result Date: 07/01/2020 CLINICAL DATA:  Fever EXAM: PORTABLE CHEST 1 VIEW COMPARISON:  June 26, 2020 FINDINGS: Enteric tube tip is below the diaphragm. Apparent atelectasis left lower lobe persists. Small granuloma lateral left base is stable. Lungs elsewhere clear. Heart is borderline prominent, stable, with pulmonary vascularity within normal limits. No adenopathy. There is aortic atherosclerosis. No bone lesions. IMPRESSION: Atelectasis left base. A mild degree of superimposed pneumonia in the left base cannot be excluded. Appearance similar  to recent study. Small granuloma lateral left base. Lungs elsewhere clear. Stable cardiac prominence. Enteric tube tip below diaphragm. Electronically Signed   By: Bretta Bang III M.D.   On: 07/01/2020 09:47    Scheduled Meds: . amLODipine  10 mg Per Tube Daily  . carvedilol  25 mg Per Tube BID  . chlorhexidine  15 mL Mouth Rinse BID  . Chlorhexidine Gluconate Cloth  6 each Topical Daily  . enoxaparin (LOVENOX) injection  40 mg Subcutaneous Daily  . feeding supplement (PROSource TF)  45 mL Per Tube BID  . free water  200 mL Per Tube Q6H  . guaiFENesin  5 mL Per Tube Q12H  . lisinopril  5 mg Per  Tube Daily  . mouth rinse  15 mL Mouth Rinse q12n4p  . multivitamin with minerals  1 tablet Per Tube Daily  . pantoprazole sodium  40 mg Per Tube QHS  . senna-docusate  1 tablet Per Tube BID  . sodium chloride flush  10-40 mL Intracatheter Q12H   Continuous Infusions: . sodium chloride 10 mL/hr at 06/30/20 0400  . feeding supplement (OSMOLITE 1.5 CAL) 1,000 mL (06/30/20 2009)  . vancomycin 1,500 mg (07/01/20 1311)   PRN Meds: sodium chloride, acetaminophen **OR** acetaminophen (TYLENOL) oral liquid 160 mg/5 mL **OR** acetaminophen, bisacodyl, hydrALAZINE, labetalol, ondansetron (ZOFRAN) IV, sodium chloride flush  Time spent: 35 minutes  Author: Lynden Oxford, MD [facility] 07/01/2020 8:24 PM  To reach On-call, see care teams to locate the attending and reach out via [email or web address]. Between 7PM-7AM, please contact night-coverage If you still have difficulty reaching the attending provider, please page the [facility] (Director on Call) for [facility] on amion for assistance.

## 2020-07-01 NOTE — Plan of Care (Signed)
  Problem: Education: Goal: Knowledge of General Education information will improve Description: Including pain rating scale, medication(s)/side effects and non-pharmacologic comfort measures Outcome: Progressing   Problem: Health Behavior/Discharge Planning: Goal: Ability to manage health-related needs will improve Outcome: Progressing   Problem: Clinical Measurements: Goal: Ability to maintain clinical measurements within normal limits will improve Outcome: Progressing Goal: Will remain free from infection Outcome: Progressing Goal: Diagnostic test results will improve Outcome: Progressing Goal: Respiratory complications will improve Outcome: Progressing Goal: Cardiovascular complication will be avoided Outcome: Progressing   Problem: Activity: Goal: Risk for activity intolerance will decrease Outcome: Progressing   Problem: Nutrition: Goal: Adequate nutrition will be maintained Outcome: Progressing   Problem: Coping: Goal: Level of anxiety will decrease Outcome: Progressing   Problem: Elimination: Goal: Will not experience complications related to bowel motility Outcome: Progressing Goal: Will not experience complications related to urinary retention Outcome: Progressing   Problem: Pain Managment: Goal: General experience of comfort will improve Outcome: Progressing   Problem: Safety: Goal: Ability to remain free from injury will improve Outcome: Progressing   Problem: Skin Integrity: Goal: Risk for impaired skin integrity will decrease Outcome: Progressing   Problem: Education: Goal: Knowledge of disease or condition will improve Outcome: Progressing Goal: Knowledge of secondary prevention will improve Outcome: Progressing Goal: Knowledge of patient specific risk factors addressed and post discharge goals established will improve Outcome: Progressing Goal: Individualized Educational Video(s) Outcome: Progressing   Problem: Coping: Goal: Will verbalize  positive feelings about self Outcome: Progressing Goal: Will identify appropriate support needs Outcome: Progressing   Problem: Health Behavior/Discharge Planning: Goal: Ability to manage health-related needs will improve Outcome: Progressing   Problem: Self-Care: Goal: Ability to participate in self-care as condition permits will improve Outcome: Progressing Goal: Verbalization of feelings and concerns over difficulty with self-care will improve Outcome: Progressing Goal: Ability to communicate needs accurately will improve Outcome: Progressing   Problem: Nutrition: Goal: Risk of aspiration will decrease Outcome: Progressing Goal: Dietary intake will improve Outcome: Progressing   Problem: Intracerebral Hemorrhage Tissue Perfusion: Goal: Complications of Intracerebral Hemorrhage will be minimized Outcome: Progressing   

## 2020-07-01 NOTE — Consult Note (Signed)
Consultation Note Date: 07/01/2020   Patient Name: Calvin Yates  DOB: 23-Apr-1934  MRN: [id number]  Age / Sex: 86 y.o., male  PCP: Kaleen Mask, MD Referring Physician: Rolly Salter, MD  Reason for Consultation: Establishing goals of care  HPI/Patient Profile: 86 y.o. male  with no known past medical history who was admitted on 06/15/2020 with left sided weakness.  Imaging revealed a large ICH with a 7.6 cm hemorrhage.  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  He was intubated for several days and then successfully extubated, but his mental status has not cleared.  He has suffered with aspiration pneumonia and suspected bacteremia.  His mental status seems to wax and wane.      Clinical Assessment and Goals of Care:  I have reviewed medical records including EPIC notes, labs and imaging, received report from the care team, examined the patient spoke on the phone with his son and daughter  to discuss diagnosis prognosis, GOC, EOL wishes, disposition and options.  I introduced Palliative Medicine as specialized medical care for people living with serious illness. It focuses on providing relief from the symptoms and stress of a serious illness.   We discussed a brief life review of the patient. He lives at home with his 16 yo wife who has advanced dementia.  He is her primary care taker.  He has a son and daughter [name] and Rosann Auerbach).  Also has a sister and cousin in town.  We discussed his current illness and what it means in the larger context of his on-going co-morbidities.  Natural disease trajectory and expectations at EOL were discussed.  Specifically we discussed the large bleed and the concern about previous small brain bleeds that were evident on imaging.  There is a concern that he has cerebral amyloid  angiopathy.    We decided to meet in person and have more conversation after [name] and Rosann Auerbach have visited their father.  We scheduled a meeting for Tuesday at 2:00 pm.  Prior to completing our phone call we discussed code status.  I explained that their father is currently a full code meaning that if his heart stopped or he stopped breathing and he arrested we would work very hard to resuscitate him and put him on life support.  After some conversation both [name] and Rosann Auerbach agreed that their father would not want aggressive intervention should he arrest and they agreed to DNR.   Questions and concerns were addressed.  The family was encouraged to call with questions or concerns.   Primary Decision Maker:  Steward Drone and Rosann Auerbach (Adult children)    SUMMARY OF RECOMMENDATIONS    Code status changed to DNR. PMT GOC meeting scheduled for Tuesday at 2:00 pm.   My colleague Yong Channel has kindly agreed to facilitate this meeting as I will be off service.  Code Status/Advance Care Planning: DNR  Symptom Management:   Per primary  Additional Recommendations (Limitations, Scope, Preferences):  Full Scope Treatment  Palliative Prophylaxis:   Frequent Pain Assessment  Psycho-social/Spiritual:   Desire for further Chaplaincy support: not discussed.  Prognosis:  Felt to be very poor.  If he has CAA he will have a propensity to continue to have hemorrhagic strokes.    Discharge Planning: To Be Determined      Primary Diagnoses: Present on Admission: . ICH (intracerebral hemorrhage) (HCC)   I have reviewed the medical record, interviewed the patient and family, and examined the patient. The following aspects are pertinent.  No past medical history on file. Social History   Socioeconomic History  . Marital status: Married    Spouse name: Not on file  . Number of children: Not on file  . Years of education: Not on file  . Highest education level: Not on file  Occupational  History  . Not on file  Tobacco Use  . Smoking status: Never Smoker  . Smokeless tobacco: Never Used  Vaping Use  . Vaping Use: Never used  Substance and Sexual Activity  . Alcohol use: Not Currently  . Drug use: Never  . Sexual activity: Not on file  Other Topics Concern  . Not on file  Social History Narrative  . Not on file   Social Determinants of Health   Financial Resource Strain: Not on file  Food Insecurity: Not on file  Transportation Needs: Not on file  Physical Activity: Not on file  Stress: Not on file  Social Connections: Not on file   Family History  Problem Relation Age of Onset  . Hypertension Mother   . Hypercholesterolemia Mother   . Heart disease Mother     Allergies  Allergen Reactions  . Amoxicillin Rash     Vital Signs: BP (!) 125/48 (BP Location: Right Arm)   Pulse 80   Temp 98.6 F (37 C) (Oral)   Resp (!) 22   Ht 5\' 8"  (1.727 m)   Wt 81.7 kg   SpO2 96%   BMI 27.39 kg/m  Pain Scale: Faces   Pain Score: 0-No pain   SpO2: SpO2: 96 % O2 Device:SpO2: 96 % O2 Flow Rate: .O2 Flow Rate (L/min): 1 L/min    Palliative Assessment/Data: 10%     Time In: 11:00 Time Out: 12:00 Time Total: 60 min Visit consisted of counseling and education dealing with the complex and emotionally intense issues surrounding the need for palliative care and symptom management in the setting of serious and potentially life-threatening illness. Greater than 50%  of this time was spent counseling and coordinating care related to the above assessment and plan.  Signed by: , PA-C Palliative Medicine  Please contact Palliative Medicine Team phone at [phone] for questions and concerns.  For individual provider: See [phone]

## 2020-07-01 NOTE — Progress Notes (Signed)
STROKE TEAM PROGRESS NOTE   STROKE TEAM PROGRESS NOTE   INTERVAL HISTORY Patient is more sleepier and hard to arouse today though he does open eyes to stimulation but will not follow commands consistently.  Continues to have low-grade fever.  White count is still elevated though improved from yesterday. Serum ammonia is elevated but liver enzymes  And rest of metabolic panel labs are normal.  Blood cultures are pending  Vitals:   07/01/20 0343 07/01/20 0500 07/01/20 0738 07/01/20 1154  BP: (!) 151/57  121/83 (!) 149/57  Pulse: 88  88 96  Resp: (!) 22  (!) 22 (!) 22  Temp: 99.4 F (37.4 C)  (!) 101 F (38.3 C) (!) 101.6 F (38.7 C)  TempSrc: Oral  Oral Oral  SpO2: 95%  96% 97%  Weight:  81.7 kg    Height:       CBC:  Recent Labs  Lab 06/26/20 0805 06/27/20 0318 06/30/20 1207 07/01/20 0412  WBC 18.4*   < > 20.9* 18.2*  NEUTROABS 14.6*  --  17.5*  --   HGB 10.5*   < > 10.8* 10.7*  HCT 33.6*   < > 32.9* 31.2*  MCV 101.8*   < > 98.5 96.9  PLT 219   < > 263 288   < > = values in this interval not displayed.   Basic Metabolic Panel:  Recent Labs  Lab 06/27/20 0318 06/28/20 0825 06/30/20 1207 07/01/20 0412  NA 146*   < > 139 137  K 4.0   < > 4.3 4.1  CL 115*   < > 112* 111  CO2 22   < > 19* 22  GLUCOSE 134*   < > 163* 145*  BUN 29*   < > 28* 28*  CREATININE 0.98   < > 0.74 0.86  CALCIUM 8.1*   < > 7.7* 7.6*  MG 2.4  --  2.3 2.5*  PHOS 3.4  --  2.8  --    < > = values in this interval not displayed.   Lipid Panel:  No results for input(s): CHOL, TRIG, HDL, CHOLHDL, VLDL, LDLCALC in the last 168 hours. HgbA1c:  No results for input(s): HGBA1C in the last 168 hours. Urine Drug Screen:  No results for input(s): LABOPIA, COCAINSCRNUR, LABBENZ, AMPHETMU, THCU, LABBARB in the last 168 hours.  Alcohol Level No results for input(s): ETH in the last 168 hours.  IMAGING past 24 hours ECHOCARDIOGRAM COMPLETE  Result Date: 06/17/2020 IMPRESSIONS   1. Left ventricular  ejection fraction, by estimation, is 70 to 75%. The left ventricle has hyperdynamic function. The left ventricle has no regional wall motion abnormalities. Left ventricular diastolic parameters are consistent with Grade I diastolic dysfunction (impaired relaxation).   2. Right ventricular systolic function is normal. The right ventricular size is normal. Tricuspid regurgitation signal is inadequate for assessing PA pressure.   3. The mitral valve is normal in structure. Trivial mitral valve regurgitation. No evidence of mitral stenosis.   4. The aortic valve is tricuspid. Aortic valve regurgitation is not visualized. Mild aortic valve sclerosis is present, with no evidence of aortic valve stenosis.   5. The inferior vena cava is normal in size with greater than 50% respiratory variability, suggesting right atrial pressure of 3 mmHg.    IMAGING past 24 hours MR BRAIN W WO CONTRAST  Result Date: 06/15/2020 CLINICAL DATA:   IMPRESSION: 1. Innumerable foci of hemosiderin deposition scattered throughout the brain consistent with previous hemorrhagic infarctions. Intraparenchymal  hematoma at the right frontoparietal junction with internal clot retraction, measuring 7.8 x 4.7 x 4.6 cm. No abnormal enhancement to suggest that this represents a hemorrhagic mass. Small hemorrhage in the left temporal lobe cannot be specifically identified by MRI as different than the other foci of hemosiderin deposition. 2. Small amount of subarachnoid hemorrhage within the sulci. 3. Mild mass-effect upon the right lateral ventricle with right-to-left shift 3 mm. Chronic small-vessel ischemic changes of the white matter elsewhere. The overall pattern could either be due to amyloid angiopathy or hypertensive vascular disease. 4. No finding to suggest the presence metastatic disease. Electronically Signed   By: Paulina Fusi M.D.   On: 06/15/2020 22:56   DG CHEST PORT 1 VIEW  Result Date: 06/15/2020 CLINICAL DATA:   Unresponsive. EXAM: PORTABLE CHEST 1 VIEW COMPARISON:  None. FINDINGS: The heart size and mediastinal contours are within normal limits. Both lungs are clear. The visualized skeletal structures are unremarkable. IMPRESSION: No active disease. Electronically Signed   By: Lupita Raider M.D.   On: 06/15/2020 20:29   CT HEAD CODE STROKE WO CONTRAST  Result Date: 06/15/2020 IMPRESSION:  1. Right frontoparietal parenchymal hemorrhage measuring 7.6 cm. Sub-5 mm left temporal hemorrhage.  2. No significant midline shift or ventricular entrapment.  3. Right greater than left cerebral convexity subarachnoid blood products.  4. ASPECTS is 10.    Current Facility-Administered Medications:  .  0.9 %  sodium chloride infusion, , Intravenous, PRN, Micki Riley, MD, Last Rate: 10 mL/hr at 06/30/20 0400, Infusion Verify at 06/30/20 0400 .  acetaminophen (TYLENOL) tablet 650 mg, 650 mg, Oral, Q4H PRN, 650 mg at 06/24/20 1628 **OR** acetaminophen (TYLENOL) 160 MG/5ML solution 650 mg, 650 mg, Per Tube, Q4H PRN, 650 mg at 06/29/20 0203 **OR** acetaminophen (TYLENOL) suppository 650 mg, 650 mg, Rectal, Q4H PRN, Bhagat, Srishti L, MD, 650 mg at 06/17/20 0023 .  amLODipine (NORVASC) tablet 10 mg, 10 mg, Per Tube, Daily, Charlott Holler, MD, 10 mg at 07/01/20 1043 .  bisacodyl (DULCOLAX) suppository 10 mg, 10 mg, Rectal, Daily PRN, Olivencia-Simmons, Ivelisse, NP .  carvedilol (COREG) tablet 25 mg, 25 mg, Per Tube, BID, Charlott Holler, MD, 25 mg at 07/01/20 0818 .  chlorhexidine (PERIDEX) 0.12 % solution 15 mL, 15 mL, Mouth Rinse, BID, Milon Dikes, MD, 15 mL at 07/01/20 1041 .  Chlorhexidine Gluconate Cloth 2 % PADS 6 each, 6 each, Topical, Daily, Bhagat, Srishti L, MD, 6 each at 07/01/20 1044 .  enoxaparin (LOVENOX) injection 40 mg, 40 mg, Subcutaneous, Daily, Micki Riley, MD, 40 mg at 07/01/20 1042 .  feeding supplement (OSMOLITE 1.5 CAL) liquid 1,000 mL, 1,000 mL, Per Tube, Continuous, Micki Riley,  MD, Last Rate: 55 mL/hr at 06/30/20 2009, 1,000 mL at 06/30/20 2009 .  feeding supplement (PROSource TF) liquid 45 mL, 45 mL, Per Tube, BID, Micki Riley, MD, 45 mL at 07/01/20 1043 .  free water 200 mL, 200 mL, Per Tube, Q6H, Cloyd Stagers M, PA-C, 200 mL at 07/01/20 0526 .  guaiFENesin (ROBITUSSIN) 100 MG/5ML solution 100 mg, 5 mL, Per Tube, Q12H, Bevelyn Ngo, NP, 100 mg at 07/01/20 0818 .  hydrALAZINE (APRESOLINE) injection 20 mg, 20 mg, Intravenous, Q6H PRN, Olivencia-Simmons, Ivelisse, NP, 20 mg at 06/21/20 2233 .  labetalol (NORMODYNE) injection 5-20 mg, 5-20 mg, Intravenous, Q2H PRN, Milon Dikes, MD, 5 mg at 06/26/20 0540 .  lisinopril (ZESTRIL) tablet 5 mg, 5 mg, Per Tube, Daily, Milon Dikes, MD, 5  mg at 07/01/20 1043 .  MEDLINE mouth rinse, 15 mL, Mouth Rinse, q12n4p, Milon Dikes, MD, 15 mL at 06/30/20 1600 .  multivitamin with minerals tablet 1 tablet, 1 tablet, Per Tube, Daily, Micki Riley, MD, 1 tablet at 07/01/20 1043 .  ondansetron (ZOFRAN) injection 4 mg, 4 mg, Intravenous, Q6H PRN, Bhagat, Srishti L, MD, 4 mg at 06/16/20 1014 .  pantoprazole sodium (PROTONIX) 40 mg/20 mL oral suspension 40 mg, 40 mg, Per Tube, QHS, Micki Riley, MD, 40 mg at 06/30/20 2206 .  senna-docusate (Senokot-S) tablet 1 tablet, 1 tablet, Per Tube, BID, Micki Riley, MD, 1 tablet at 07/01/20 1041 .  sodium chloride flush (NS) 0.9 % injection 10-40 mL, 10-40 mL, Intracatheter, Q12H, Micki Riley, MD, 10 mL at 07/01/20 1044 .  sodium chloride flush (NS) 0.9 % injection 10-40 mL, 10-40 mL, Intracatheter, PRN, Micki Riley, MD .  vancomycin (VANCOREADY) IVPB 1500 mg/300 mL, 1,500 mg, Intravenous, Q24H, von Pearletha Furl, RPH, Last Rate: 150 mL/hr at 06/30/20 1400, Infusion Verify at 06/30/20 1400   PHYSICAL EXAM  Temp:  [98.4 F (36.9 C)-101.6 F (38.7 C)] 101.6 F (38.7 C) (03/13 1154) Pulse Rate:  [72-96] 96 (03/13 1154) Resp:  [18-24] 22 (03/13 1154) BP:  (117-151)/(50-91) 149/57 (03/13 1154) SpO2:  [94 %-99 %] 97 % (03/13 1154) Weight:  [81.7 kg] 81.7 kg (03/13 0500) General: Well-developed well-nourished, resting in bed with eyes closed in NAD.  HEENT: Normocephalic, atraumatic, NG tube in place CVS: Regular rate rhythm Respiratory: Respiration even and unlabored\ GI: Abd soft, ND, NTTP  Neurological Exam : Patient is sleepy.  Keeps eyes closed unless stimulated. Opens eyes briefly to verbal stimulation. Speech remains dysarthric with soft voice.  Difficult to understand Oriented x4. Names 2/2 objects.  Has poor attention concentration and neglects the left hemibody.  Cranial nerves: Pupils are equal round reactive to light, blinks to threat from right but not from the left, does not cross midline to the left-has right gaze preference, left lower facial weakness also seen.  Motor exam: Right upper and lower extremity are 4+/5.  Left upper extremity is nearly flaccid    Left lower extremity with a little bit withdrawal   Sensory: Grimaces to noxious stimulation all over, without any significant difference noted between the grimace to noxious stimulation on the right versus left. Coordination: Difficult to assess given his mentation  ASSESSMENT/PLAN  86 year old male with very minimal past medical history. Ate dinner with his wife at 1730 and having a conversationtill 1800 hrs. Abruptly at 1815 was found to be down. EMS on arrival found his blood pressure to be high systolic 200s with slurred speech and left sided weakness. He had an episode of emesis on route and was transiently hypoxemic.   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  -CTA head &  neck stable ICH, Mild to moderate right M2, right ICA terminus, distal basilar artery and bilateral PCA narrowing. -MRI stable hematoma, no underlying hemorrhagic mets. Likely CAA vs hypertensive changes. Chronic hemosiderin deposits from prior ICHs . -2D Echo: EF 70-75%, Grade I diastolic dysfunction -LDL 103 -HgbA1c 5.6 -VTE prophylaxis - Lovenox  daily -No AC/AP prior to admission, now no AC/AP due to ICH and likely CAA -  Therapy recommendations:  CIR -Disposition:  pending   Cerebral Edema -Was on HTS. Off 3% saline now -Na now 146 -Allow Na gradually trending down with continuing FW flushes  Concern for seizure -LTM negative for seizures.  Do not see a need for antiepileptics.  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 The overall pattern could either be due to amyloid angiopathy or hypertensive vascular disease. -No antiplatelets or anticoagulation. -Long term BP goal < 140   Hypertension -Home meds: none, no hx of HTN but with BP 200s on EMS arrival -Currently on Norvasc 10mg  daily, coreg 25mg  bid - added Lisinopril 5mg  daily 3/10. -off cleviprex infusion -Labetalol 20mg  q2hr prn, hydralazine 20mg  q6hr prn -Long-term BP goal < 140 due to CAA.   Hyperlipidemia -Home meds:  None  -LDL 103, goal < 70 -No statin at this time due to ICH and concern of CAA  Dysphagia -Secondary to stroke -SLP following -Cortrak in place -On Tube feeds and FW  Aspiration Pneumonia -Vomited in route w/EMS -Febrile: Tmax 101.2->afebrile  -Leukocytosis: WBC 19.7 -> 21.2 ->12.2->11.4->13.8-->15.1-->18.1-->19.3->18.5 ->pending -CXR bibasilar opacities -Completed 5 days of ceftriaxone  -Currently on vancomycin since 3/9.  Appreciate CCM           and pharmacy  assistance. -Sputum culture (2/28): Negative -Aggressive pulmonary toilet on floor (IS q1 hour while           awake, flutter valve, pulm toilet q 4hrs)  Concern for MRSA bacteremia -Patient developed persistent fever and worsening                 leukocytosis 3/9, this prompted obtaining repeat blood          cultures and broadening IV antibiotics to include                    vancomycin  -Repeat blood cultures resulted positive for staph      epidermidis with methicillin resistance in aerobic            bottle  -S/p 5-day course of ceftriaxone with continued                      leukocytosis/fever -T-max 100.6 overnight, flat fever curve  -Leukocytosis:             15.1->18.4->19.3->19->18.5->pending -On Zosyn/Vanc -Repeat blood cultures are pending -PICC line removed 3/10 -Appreciate CCM management   Acute hypoxic respiratory failure -Likely related to aspiration, urgently intubated 3/4,       extubated 3/7-resolved.  -CCM had a conversation with family regarding re-       intubation-they need some time to think about goals of care.  We will continue full scope of care for now.  -Appreciate CCM assistance  Left arm and bilateral lower extremity swelling -Check Dopplers bilateral lower extremity and left upper extremity-reviewed-negative for DVT.  Other Stroke Risk Factors -Advanced Age >/= 62    Hospital day # 16   Patient`s neurological condition remains stable but has not improved significantly since admission hence overall prognosis remains guarded.  Continue antibiotics and follow blood cultures no other cause of encephalopathy except slightly elevated ammonia possible sepsis.  Discussed with Dr. .  No family at the bedside.  Greater than 50% time during this 25-minute visit was spent on counseling and coordination of  care team.  Delia Heady, MD   Delia Heady, MD Medical Director [facility] Pager: [phone] 07/01/2020 12:28 PM    To contact  Stroke Continuity provider, please refer to [email or web address]. After hours, contact General Neurology

## 2020-07-01 NOTE — Plan of Care (Signed)
Problem: Education: Goal: Knowledge of General Education information will improve Description: Including pain rating scale, medication(s)/side effects and non-pharmacologic comfort measures 07/01/2020 0335 by Glean Hess, RN Outcome: Progressing 07/01/2020 0334 by Glean Hess, RN Outcome: Progressing   Problem: Health Behavior/Discharge Planning: Goal: Ability to manage health-related needs will improve 07/01/2020 0335 by Glean Hess, RN Outcome: Progressing 07/01/2020 0334 by Glean Hess, RN Outcome: Progressing   Problem: Clinical Measurements: Goal: Ability to maintain clinical measurements within normal limits will improve 07/01/2020 0335 by Glean Hess, RN Outcome: Progressing 07/01/2020 0334 by Glean Hess, RN Outcome: Progressing Goal: Will remain free from infection 07/01/2020 0335 by Glean Hess, RN Outcome: Progressing 07/01/2020 0334 by Glean Hess, RN Outcome: Progressing Goal: Diagnostic test results will improve 07/01/2020 0335 by Glean Hess, RN Outcome: Progressing 07/01/2020 0334 by Glean Hess, RN Outcome: Progressing Goal: Respiratory complications will improve 07/01/2020 0335 by Glean Hess, RN Outcome: Progressing 07/01/2020 0334 by Glean Hess, RN Outcome: Progressing Goal: Cardiovascular complication will be avoided 07/01/2020 0335 by Glean Hess, RN Outcome: Progressing 07/01/2020 0334 by Glean Hess, RN Outcome: Progressing   Problem: Activity: Goal: Risk for activity intolerance will decrease 07/01/2020 0335 by Glean Hess, RN Outcome: Progressing 07/01/2020 0334 by Glean Hess, RN Outcome: Progressing   Problem: Nutrition: Goal: Adequate nutrition will be maintained 07/01/2020 0335 by Glean Hess, RN Outcome: Progressing 07/01/2020 0334 by Glean Hess, RN Outcome: Progressing   Problem:  Coping: Goal: Level of anxiety will decrease 07/01/2020 0335 by Glean Hess, RN Outcome: Progressing 07/01/2020 0334 by Glean Hess, RN Outcome: Progressing   Problem: Elimination: Goal: Will not experience complications related to bowel motility 07/01/2020 0335 by Glean Hess, RN Outcome: Progressing 07/01/2020 0334 by Glean Hess, RN Outcome: Progressing Goal: Will not experience complications related to urinary retention 07/01/2020 0335 by Glean Hess, RN Outcome: Progressing 07/01/2020 0334 by Glean Hess, RN Outcome: Progressing   Problem: Pain Managment: Goal: General experience of comfort will improve 07/01/2020 0335 by Glean Hess, RN Outcome: Progressing 07/01/2020 0334 by Glean Hess, RN Outcome: Progressing   Problem: Safety: Goal: Ability to remain free from injury will improve 07/01/2020 0335 by Glean Hess, RN Outcome: Progressing 07/01/2020 0334 by Glean Hess, RN Outcome: Progressing   Problem: Skin Integrity: Goal: Risk for impaired skin integrity will decrease 07/01/2020 0335 by Glean Hess, RN Outcome: Progressing 07/01/2020 0334 by Glean Hess, RN Outcome: Progressing   Problem: Education: Goal: Knowledge of disease or condition will improve 07/01/2020 0335 by Glean Hess, RN Outcome: Progressing 07/01/2020 0334 by Glean Hess, RN Outcome: Progressing Goal: Knowledge of secondary prevention will improve 07/01/2020 0335 by Glean Hess, RN Outcome: Progressing 07/01/2020 0334 by Glean Hess, RN Outcome: Progressing Goal: Knowledge of patient specific risk factors addressed and post discharge goals established will improve 07/01/2020 0335 by Glean Hess, RN Outcome: Progressing 07/01/2020 0334 by Glean Hess, RN Outcome: Progressing Goal: Individualized Educational Video(s) 07/01/2020 0335 by Glean Hess, RN Outcome: Progressing 07/01/2020 0334 by Glean Hess, RN Outcome: Progressing   Problem: Coping: Goal: Will verbalize positive feelings about self 07/01/2020 0335 by Glean Hess, RN Outcome: Progressing 07/01/2020 0334 by Glean Hess, RN Outcome: Progressing Goal: Will identify appropriate support needs 07/01/2020 0335 by Glean Hess, RN Outcome: Progressing 07/01/2020 0334 by Glean Hess, RN Outcome: Progressing  Problem: Health Behavior/Discharge Planning: Goal: Ability to manage health-related needs will improve 07/01/2020 0335 by Glean Hess, RN Outcome: Progressing 07/01/2020 0334 by Glean Hess, RN Outcome: Progressing   Problem: Self-Care: Goal: Ability to participate in self-care as condition permits will improve 07/01/2020 0335 by Glean Hess, RN Outcome: Progressing 07/01/2020 0334 by Glean Hess, RN Outcome: Progressing Goal: Verbalization of feelings and concerns over difficulty with self-care will improve 07/01/2020 0335 by Glean Hess, RN Outcome: Progressing 07/01/2020 0334 by Glean Hess, RN Outcome: Progressing Goal: Ability to communicate needs accurately will improve 07/01/2020 0335 by Glean Hess, RN Outcome: Progressing 07/01/2020 0334 by Glean Hess, RN Outcome: Progressing   Problem: Nutrition: Goal: Risk of aspiration will decrease 07/01/2020 0335 by Glean Hess, RN Outcome: Progressing 07/01/2020 0334 by Glean Hess, RN Outcome: Progressing Goal: Dietary intake will improve 07/01/2020 0335 by Glean Hess, RN Outcome: Progressing 07/01/2020 0334 by Glean Hess, RN Outcome: Progressing   Problem: Intracerebral Hemorrhage Tissue Perfusion: Goal: Complications of Intracerebral Hemorrhage will be minimized 07/01/2020 0335 by Glean Hess, RN Outcome: Progressing 07/01/2020 0334 by  Glean Hess, RN Outcome: Progressing

## 2020-07-01 NOTE — Progress Notes (Signed)
Discussed with MD about his overall condition, as his resp have been in the upper 20s and even going to the 30s for short periods.  He was having repetitive hand motion that he did have upon admission to this floor from ICU.  MD said EEG did not show seizure activity and he daughter and wife said he is very nervous anyway and often has that same hand gesture at home.  He denies pain but did have a fever that did go down after Tylenol and ice packs.  His sats are in the upper 90s with and without O2.  Rhonchi noted in all lung fields and he does cough to clear his airway but he is a mouth breather.  He has had frequent oral care, as he does not totally clear his secretions and holds them in his mouth.  He is turned q 2-3 hours to help move his secretions and to prevent skin breakdown.  His MEWS has been in the yellow ever since he came to [place] from ICU and he is watched closely for any changes.

## 2020-07-01 NOTE — Plan of Care (Signed)
  Problem: Nutrition: Goal: Risk of aspiration will decrease Outcome: Progressing Goal: Dietary intake will improve Outcome: Progressing   Problem: Intracerebral Hemorrhage Tissue Perfusion: Goal: Complications of Intracerebral Hemorrhage will be minimized Outcome: Progressing   

## 2020-07-02 DIAGNOSIS — I619 Nontraumatic intracerebral hemorrhage, unspecified: Secondary | ICD-10-CM | POA: Diagnosis not present

## 2020-07-02 DIAGNOSIS — R509 Fever, unspecified: Secondary | ICD-10-CM | POA: Diagnosis not present

## 2020-07-02 DIAGNOSIS — B957 Other staphylococcus as the cause of diseases classified elsewhere: Secondary | ICD-10-CM | POA: Diagnosis not present

## 2020-07-02 DIAGNOSIS — D72829 Elevated white blood cell count, unspecified: Secondary | ICD-10-CM | POA: Diagnosis not present

## 2020-07-02 DIAGNOSIS — I611 Nontraumatic intracerebral hemorrhage in hemisphere, cortical: Secondary | ICD-10-CM | POA: Diagnosis not present

## 2020-07-02 DIAGNOSIS — R531 Weakness: Secondary | ICD-10-CM | POA: Diagnosis not present

## 2020-07-02 LAB — BASIC METABOLIC PANEL
Anion gap: 3 — ABNORMAL LOW (ref 5–15)
BUN: 30 mg/dL — ABNORMAL HIGH (ref 8–23)
CO2: 22 mmol/L (ref 22–32)
Calcium: 7.6 mg/dL — ABNORMAL LOW (ref 8.9–10.3)
Chloride: 112 mmol/L — ABNORMAL HIGH (ref 98–111)
Creatinine, Ser: 0.88 mg/dL (ref 0.61–1.24)
GFR, Estimated: >60 mL/min (ref >60)
Glucose, Bld: 154 mg/dL — ABNORMAL HIGH (ref 70–99)
Potassium: 4.1 mmol/L (ref 3.5–5.1)
Sodium: 137 mmol/L (ref 135–145)

## 2020-07-02 LAB — CBC
HCT: 31 % — ABNORMAL LOW (ref 39.0–52.0)
Hemoglobin: 10.5 g/dL — ABNORMAL LOW (ref 13.0–17.0)
MCH: 32.9 pg (ref 26.0–34.0)
MCHC: 33.9 g/dL (ref 30.0–36.0)
MCV: 97.2 fL (ref 80.0–100.0)
Platelets: 298 10*3/uL (ref 150–400)
RBC: 3.19 MIL/uL — ABNORMAL LOW (ref 4.22–5.81)
RDW: 12.7 % (ref 11.5–15.5)
WBC: 18.4 10*3/uL — ABNORMAL HIGH (ref 4.0–10.5)
nRBC: 0 % (ref 0.0–0.2)

## 2020-07-02 LAB — GLUCOSE, CAPILLARY
Glucose-Capillary: 123 mg/dL — ABNORMAL HIGH (ref 70–99)
Glucose-Capillary: 128 mg/dL — ABNORMAL HIGH (ref 70–99)
Glucose-Capillary: 130 mg/dL — ABNORMAL HIGH (ref 70–99)
Glucose-Capillary: 134 mg/dL — ABNORMAL HIGH (ref 70–99)
Glucose-Capillary: 136 mg/dL — ABNORMAL HIGH (ref 70–99)
Glucose-Capillary: 139 mg/dL — ABNORMAL HIGH (ref 70–99)
Glucose-Capillary: 154 mg/dL — ABNORMAL HIGH (ref 70–99)

## 2020-07-02 LAB — MAGNESIUM: Magnesium: 2.5 mg/dL — ABNORMAL HIGH (ref 1.7–2.4)

## 2020-07-02 LAB — VANCOMYCIN, RANDOM: Vancomycin Rm: 15

## 2020-07-02 MED ORDER — SODIUM CHLORIDE 0.9 % IV SOLN
3.0000 g | Freq: Four times a day (QID) | INTRAVENOUS | Status: DC
  Administered 2020-07-02 – 2020-07-03 (×4): 3 g via INTRAVENOUS
  Filled 2020-07-02: qty 8
  Filled 2020-07-02: qty 3
  Filled 2020-07-02 (×3): qty 8
  Filled 2020-07-02: qty 3
  Filled 2020-07-02 (×3): qty 8

## 2020-07-02 MED ORDER — CHLORHEXIDINE GLUCONATE 0.12 % MT SOLN
15.0000 mL | Freq: Four times a day (QID) | OROMUCOSAL | Status: DC
  Administered 2020-07-02 – 2020-07-03 (×4): 15 mL via OROMUCOSAL
  Filled 2020-07-02 (×2): qty 15

## 2020-07-02 NOTE — Progress Notes (Signed)
Occupational Therapy Treatment Patient Details Name: Calvin Yates MRN: [id number] DOB: [birth year] Today's Date: 07/02/2020    History of present illness 86 yo male presenting with elevated BP and tachycardia after being found down by his wife. 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   OT comments  Upon arrival, pt was breathing heavily and supine in bed; RN reporting labored breathing this AM. Pt SpO2 95% on RA and HR 84. Repositioning pt in bed for upright posture to optimize breathing and engagement in ADLs. Pt participating in grooming tasks with Mod-Max cues and Min A. Pt motivated to participate in therapy despite fatigue and heavy breathing. Continue to recommend dc to CIR and will continue to follow acutely as admitted.    Follow Up Recommendations  CIR;Supervision/Assistance - 24 hour    Equipment Recommendations  3 in 1 bedside commode;Wheelchair (measurements OT);Wheelchair cushion (measurements OT);Hospital bed    Recommendations for Other Services Rehab consult    Precautions / Restrictions Precautions Precautions: Fall Precaution Comments: c-spine cleared, no need for c-collar       Mobility Bed Mobility Overal bed mobility: Needs Assistance Bed Mobility: Rolling;Sidelying to Sit Rolling: Max assist;+2 for physical assistance;+2 for safety/equipment Sidelying to sit: Max assist;+2 for physical assistance;+2 for safety/equipment Supine to sit: Max assist;+2 for physical assistance;+2 for safety/equipment Sit to supine: Max assist;+2 for physical assistance   General bed mobility comments: pt lethargic and slow to respond. maxA to initiate and complete movements. pt unable to use even r-sided extremities to attempt to push up to sitting from sidelying    Transfers                 General transfer comment: Defer    Balance                                           ADL either performed  or assessed with clinical judgement   ADL Overall ADL's : Needs assistance/impaired     Grooming: Minimal assistance;Bed level;Cueing for sequencing (upright posture in bed) Grooming Details (indicate cue type and reason): Pt washing his face with Min A to present wash clothe and bring to hand. Max cues for attention and to track grooming items. Requriing cues for both intation and then termination of oral care                               General ADL Comments: Upon arrival, pt supine and low in the bed. Noting labored and rapid breathing. RN present and aware. Focused session on optimizing upright postue for arousal and breathing. Pt following cues, making eye contact, and particiapting in grooming tasks. Requiring Min A for grooming and Mod-Max cues for intiation, sequencing, and termination     Vision   Vision Assessment?: Yes Additional Comments: Noting L gaze and head turn. Difficulty tracking to R.   Perception     Praxis      Cognition Arousal/Alertness: Lethargic Behavior During Therapy: Restless;Flat affect Overall Cognitive Status: Impaired/Different from baseline Area of Impairment: Orientation;Attention;Following commands;Safety/judgement;Awareness;Problem solving                 Orientation Level:  (Unable to answer orientation questions) Current Attention Level: Focused;Sustained   Following Commands: Follows one step commands with increased time;Follows one step commands inconsistently  Safety/Judgement: Decreased awareness of safety;Decreased awareness of deficits Awareness: Intellectual Problem Solving: Slow processing;Decreased initiation;Difficulty sequencing;Requires verbal cues;Requires tactile cues General Comments: Pt following simple direct cues with increased time. Difficulty initating ADLs and then terminating oral care. Attending to therapist on R side. Answering 75% of yes/no questions. Able to sustain attention to wash his face and  perform oral care (short periods). Making eye contact. Quickly fatigued and closing eyes at end of session        Exercises     Shoulder Instructions       General Comments SpO2 95% on RA. HR 84    Pertinent Vitals/ Pain       Pain Assessment: No/denies pain Pain Intervention(s): Monitored during session  Home Living                                          Prior Functioning/Environment              Frequency  Min 2X/week        Progress Toward Goals  OT Goals(current goals can now be found in the care plan section)  Progress towards OT goals: Progressing toward goals  Acute Rehab OT Goals Patient Stated Goal: none stated OT Goal Formulation: Patient unable to participate in goal setting Time For Goal Achievement: 06/30/20 Potential to Achieve Goals: Fair ADL Goals Pt Will Perform Eating: with min assist;with adaptive utensils;sitting;bed level Pt Will Perform Grooming: with min guard assist;sitting Pt Will Transfer to Toilet: with mod assist;stand pivot transfer;bedside commode Pt/caregiver will Perform Home Exercise Program: Increased ROM;Increased strength;Left upper extremity;With minimal assist Additional ADL Goal #1: Pt will follow 1-2 step commands with multimodal cues for sequencing. Additional ADL Goal #2: Pt will perform bed mobility with modA as precursor for ADL.  Plan Discharge plan remains appropriate    Co-evaluation    PT/OT/SLP Co-Evaluation/Treatment: Yes            AM-PAC OT "6 Clicks" Daily Activity     Outcome Measure   Help from another person eating meals?: Total Help from another person taking care of personal grooming?: A Little Help from another person toileting, which includes using toliet, bedpan, or urinal?: Total Help from another person bathing (including washing, rinsing, drying)?: Total Help from another person to put on and taking off regular upper body clothing?: Total Help from another person to  put on and taking off regular lower body clothing?: Total 6 Click Score: 8    End of Session    OT Visit Diagnosis: Unsteadiness on feet (R26.81);Muscle weakness (generalized) (M62.81);Cognitive communication deficit (R41.841);Other symptoms and signs involving cognitive function;Hemiplegia and hemiparesis Symptoms and signs involving cognitive functions: Nontraumatic intracerebral hemorrhage;Other Nontraumatic ICH Hemiplegia - Right/Left: Left Hemiplegia - dominant/non-dominant: Non-Dominant Hemiplegia - caused by: Other cerebrovascular disease   Activity Tolerance Patient limited by lethargy   Patient Left with call bell/phone within reach;with restraints reapplied;in bed;with bed alarm set   Nurse Communication Mobility status        Time: 8676-7209 OT Time Calculation (min): 31 min  Charges: OT General Charges $OT Visit: 1 Visit OT Treatments $Self Care/Home Management : 23-37 mins  Jennifier Smitherman MSOT, OTR/L Acute Rehab Pager: [phone] Office: [phone]   Theodoro Grist Lenon Kuennen 07/02/2020, 9:02 AM

## 2020-07-02 NOTE — Care Management Important Message (Signed)
Important Message  Patient Details  Name: Calvin Yates MRN: [id number] Date of Birth: 1934-07-18   Medicare Important Message Given:  Yes     Dorena Bodo 07/02/2020, 4:22 PM

## 2020-07-02 NOTE — Progress Notes (Signed)
STROKE TEAM PROGRESS NOTE   STROKE TEAM PROGRESS NOTE   INTERVAL HISTORY Patient remains sleepy and hard to arouse today though he does open eyes to stimulation and will  follow some simple commands    Continues to have low-grade fever.  White count is still elevated though stable from yesterday.  metabolic panel labs are normal.  Blood cultures negative so far  Vitals:   07/02/20 0426 07/02/20 0631 07/02/20 0800 07/02/20 1159  BP: (!) 138/59 (!) 131/57 121/67 132/64  Pulse: 87 84  83  Resp: (!) 32 (!) 26  (!) 22  Temp: 99.1 F (37.3 C) 98.3 F (36.8 C)  99.2 F (37.3 C)  TempSrc: Oral Oral  Oral  SpO2: 97% 96%  97%  Weight:  81.3 kg    Height:       CBC:  Recent Labs  Lab 06/26/20 0805 06/27/20 0318 06/30/20 1207 07/01/20 0412 07/02/20 0232  WBC 18.4*   < > 20.9* 18.2* 18.4*  NEUTROABS 14.6*  --  17.5*  --   --   HGB 10.5*   < > 10.8* 10.7* 10.5*  HCT 33.6*   < > 32.9* 31.2* 31.0*  MCV 101.8*   < > 98.5 96.9 97.2  PLT 219   < > 263 288 298   < > = values in this interval not displayed.   Basic Metabolic Panel:  Recent Labs  Lab 06/27/20 0318 06/28/20 0825 06/30/20 1207 07/01/20 0412 07/02/20 0232  NA 146*   < > 139 137 137  K 4.0   < > 4.3 4.1 4.1  CL 115*   < > 112* 111 112*  CO2 22   < > 19* 22 22  GLUCOSE 134*   < > 163* 145* 154*  BUN 29*   < > 28* 28* 30*  CREATININE 0.98   < > 0.74 0.86 0.88  CALCIUM 8.1*   < > 7.7* 7.6* 7.6*  MG 2.4  --  2.3 2.5* 2.5*  PHOS 3.4  --  2.8  --   --    < > = values in this interval not displayed.   Lipid Panel:  No results for input(s): CHOL, TRIG, HDL, CHOLHDL, VLDL, LDLCALC in the last 168 hours. HgbA1c:  No results for input(s): HGBA1C in the last 168 hours. Urine Drug Screen:  No results for input(s): LABOPIA, COCAINSCRNUR, LABBENZ, AMPHETMU, THCU, LABBARB in the last 168 hours.  Alcohol Level No results for input(s): ETH in the last 168 hours.  IMAGING past 24 hours ECHOCARDIOGRAM COMPLETE  Result Date:  06/17/2020 IMPRESSIONS   1. Left ventricular ejection fraction, by estimation, is 70 to 75%. The left ventricle has hyperdynamic function. The left ventricle has no regional wall motion abnormalities. Left ventricular diastolic parameters are consistent with Grade I diastolic dysfunction (impaired relaxation).   2. Right ventricular systolic function is normal. The right ventricular size is normal. Tricuspid regurgitation signal is inadequate for assessing PA pressure.   3. The mitral valve is normal in structure. Trivial mitral valve regurgitation. No evidence of mitral stenosis.   4. The aortic valve is tricuspid. Aortic valve regurgitation is not visualized. Mild aortic valve sclerosis is present, with no evidence of aortic valve stenosis.   5. The inferior vena cava is normal in size with greater than 50% respiratory variability, suggesting right atrial pressure of 3 mmHg.    IMAGING past 24 hours MR BRAIN W WO CONTRAST  Result Date: 06/15/2020 CLINICAL DATA:   IMPRESSION: 1.  Innumerable foci of hemosiderin deposition scattered throughout the brain consistent with previous hemorrhagic infarctions. Intraparenchymal hematoma at the right frontoparietal junction with internal clot retraction, measuring 7.8 x 4.7 x 4.6 cm. No abnormal enhancement to suggest that this represents a hemorrhagic mass. Small hemorrhage in the left temporal lobe cannot be specifically identified by MRI as different than the other foci of hemosiderin deposition. 2. Small amount of subarachnoid hemorrhage within the sulci. 3. Mild mass-effect upon the right lateral ventricle with right-to-left shift 3 mm. Chronic small-vessel ischemic changes of the white matter elsewhere. The overall pattern could either be due to amyloid angiopathy or hypertensive vascular disease. 4. No finding to suggest the presence metastatic disease. Electronically Signed   By: Paulina Fusi M.D.   On: 06/15/2020 22:56   DG CHEST PORT 1 VIEW  Result  Date: 06/15/2020 CLINICAL DATA:  Unresponsive. EXAM: PORTABLE CHEST 1 VIEW COMPARISON:  None. FINDINGS: The heart size and mediastinal contours are within normal limits. Both lungs are clear. The visualized skeletal structures are unremarkable. IMPRESSION: No active disease. Electronically Signed   By: Lupita Raider M.D.   On: 06/15/2020 20:29   CT HEAD CODE STROKE WO CONTRAST  Result Date: 06/15/2020 IMPRESSION:  1. Right frontoparietal parenchymal hemorrhage measuring 7.6 cm. Sub-5 mm left temporal hemorrhage.  2. No significant midline shift or ventricular entrapment.  3. Right greater than left cerebral convexity subarachnoid blood products.  4. ASPECTS is 10.    Current Facility-Administered Medications:  .  0.9 %  sodium chloride infusion, , Intravenous, PRN, Micki Riley, MD, Last Rate: 10 mL/hr at 06/30/20 0400, Infusion Verify at 06/30/20 0400 .  acetaminophen (TYLENOL) tablet 650 mg, 650 mg, Oral, Q4H PRN, 650 mg at 06/24/20 1628 **OR** acetaminophen (TYLENOL) 160 MG/5ML solution 650 mg, 650 mg, Per Tube, Q4H PRN, 650 mg at 07/02/20 0523 **OR** acetaminophen (TYLENOL) suppository 650 mg, 650 mg, Rectal, Q4H PRN, Bhagat, Srishti L, MD, 650 mg at 06/17/20 0023 .  amLODipine (NORVASC) tablet 10 mg, 10 mg, Per Tube, Daily, Charlott Holler, MD, 10 mg at 07/02/20 0851 .  Ampicillin-Sulbactam (UNASYN) 3 g in sodium chloride 0.9 % 100 mL IVPB, 3 g, Intravenous, Q6H, Veryl Speak, FNP, Last Rate: 200 mL/hr at 07/02/20 1459, 3 g at 07/02/20 1459 .  bisacodyl (DULCOLAX) suppository 10 mg, 10 mg, Rectal, Daily PRN, Olivencia-Simmons, Ivelisse, NP .  carvedilol (COREG) tablet 25 mg, 25 mg, Per Tube, BID, Charlott Holler, MD, 25 mg at 07/02/20 0851 .  chlorhexidine (PERIDEX) 0.12 % solution 15 mL, 15 mL, Mouth/Throat, QID, Rolly Salter, MD, 15 mL at 07/02/20 1459 .  Chlorhexidine Gluconate Cloth 2 % PADS 6 each, 6 each, Topical, Daily, Bhagat, Srishti L, MD, 6 each at 07/02/20 1058 .   enoxaparin (LOVENOX) injection 40 mg, 40 mg, Subcutaneous, Daily, Micki Riley, MD, 40 mg at 07/02/20 0852 .  feeding supplement (OSMOLITE 1.5 CAL) liquid 1,000 mL, 1,000 mL, Per Tube, Continuous, Micki Riley, MD, Last Rate: 55 mL/hr at 07/01/20 1945, 1,000 mL at 07/01/20 1945 .  feeding supplement (PROSource TF) liquid 45 mL, 45 mL, Per Tube, BID, Micki Riley, MD, 45 mL at 07/02/20 0852 .  free water 200 mL, 200 mL, Per Tube, Q6H, Cloyd Stagers M, PA-C, 200 mL at 07/02/20 1158 .  guaiFENesin (ROBITUSSIN) 100 MG/5ML solution 100 mg, 5 mL, Per Tube, Q12H, Bevelyn Ngo, NP, 100 mg at 07/02/20 0851 .  hydrALAZINE (APRESOLINE) injection 20 mg,  20 mg, Intravenous, Q6H PRN, Olivencia-Simmons, Ivelisse, NP, 20 mg at 06/21/20 2233 .  labetalol (NORMODYNE) injection 5-20 mg, 5-20 mg, Intravenous, Q2H PRN, Milon DikesArora, Ashish, MD, 5 mg at 06/26/20 0540 .  lisinopril (ZESTRIL) tablet 5 mg, 5 mg, Per Tube, Daily, Milon DikesArora, Ashish, MD, 5 mg at 07/02/20 0851 .  MEDLINE mouth rinse, 15 mL, Mouth Rinse, q12n4p, Milon DikesArora, Ashish, MD, 15 mL at 07/02/20 1500 .  multivitamin with minerals tablet 1 tablet, 1 tablet, Per Tube, Daily, Micki RileySethi, Amberle Lyter S, MD, 1 tablet at 07/02/20 0851 .  ondansetron (ZOFRAN) injection 4 mg, 4 mg, Intravenous, Q6H PRN, Bhagat, Srishti L, MD, 4 mg at 06/16/20 1014 .  pantoprazole sodium (PROTONIX) 40 mg/20 mL oral suspension 40 mg, 40 mg, Per Tube, QHS, Micki RileySethi, Geovanna Simko S, MD, 40 mg at 07/01/20 2105 .  sodium chloride flush (NS) 0.9 % injection 10-40 mL, 10-40 mL, Intracatheter, Q12H, Micki RileySethi, Shanyce Daris S, MD, 10 mL at 07/02/20 1159 .  sodium chloride flush (NS) 0.9 % injection 10-40 mL, 10-40 mL, Intracatheter, PRN, Micki RileySethi, Yomaris Palecek S, MD .  vancomycin (VANCOREADY) IVPB 1500 mg/300 mL, 1,500 mg, Intravenous, Q24H, Veryl Speakalone, Gregory D, FNP, Last Rate: 150 mL/hr at 07/02/20 1155, 1,500 mg at 07/02/20 1155   PHYSICAL EXAM  Temp:  [98.2 F (36.8 C)-99.2 F (37.3 C)] 99.2 F (37.3 C) (03/14  1159) Pulse Rate:  [80-87] 83 (03/14 1159) Resp:  [21-32] 22 (03/14 1159) BP: (121-141)/(48-70) 132/64 (03/14 1159) SpO2:  [96 %-97 %] 97 % (03/14 1159) Weight:  [81.3 kg] 81.3 kg (03/14 0631) General: Well-developed well-nourished, resting in bed with eyes closed in NAD.  HEENT: Normocephalic, atraumatic, NG tube in place CVS: Regular rate rhythm Respiratory: Respiration even and unlabored\ GI: Abd soft, ND, NTTP  Neurological Exam : Patient is sleepy.  Keeps eyes closed unless stimulated. Opens eyes briefly to verbal stimulation. Speech remains dysarthric with soft voice.  Difficult to understand Oriented x4. Names 2/2 objects.  Has poor attention concentration and neglects the left hemibody.  Cranial nerves: Pupils are equal round reactive to light, blinks to threat from right but not from the left, does not cross midline to the left-has right gaze preference, left lower facial weakness also seen.  Motor exam: Right upper and lower extremity are 4+/5.  Left upper extremity is nearly flaccid    Left lower extremity with a little bit withdrawal   Sensory: Grimaces to noxious stimulation all over, without any significant difference noted between the grimace to noxious stimulation on the right versus left. Coordination: Difficult to assess given his mentation  ASSESSMENT/PLAN  86 year old male with very minimal past medical history. Ate dinner with his wife at 1730 and having a conversationtill 1800 hrs. Abruptly at 1815 was found to be down. EMS on arrival found his blood pressure to be high systolic 200s with slurred speech and left sided weakness. He had an episode of emesis on route and was transiently hypoxemic.   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  -CTA head & neck stable ICH, Mild to moderate right M2, right ICA terminus, distal basilar artery and bilateral PCA narrowing. -MRI stable hematoma, no underlying hemorrhagic mets. Likely CAA vs hypertensive changes. Chronic hemosiderin deposits from prior ICHs . -2D Echo: EF 70-75%, Grade  I diastolic dysfunction -LDL 103 -HgbA1c 5.6 -VTE prophylaxis - Lovenox  daily -No AC/AP prior to admission, now no AC/AP due to ICH and likely CAA -Therapy recommendations:  CIR -Disposition:  pending   Cerebral Edema -Was on HTS. Off 3% saline now -Na now 146 -Allow Na gradually trending down with continuing FW flushes  Concern for seizure -LTM negative for seizures.  Do not see a need for antiepileptics.  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 The overall pattern could either be due to amyloid angiopathy or hypertensive vascular disease. -No antiplatelets or anticoagulation. -Long term BP goal < 140   Hypertension -Home meds: none, no hx of HTN but with BP 200s on EMS arrival -Currently on Norvasc  daily, coreg  bid - added Lisinopril  daily 3/10. -off cleviprex infusion -Labetalol  q2hr prn, hydralazine  q6hr prn -Long-term BP goal < 140 due to CAA.   Hyperlipidemia -Home meds:  None  -LDL 103, goal < 70 -No statin at this time due to ICH and concern of CAA  Dysphagia -Secondary to stroke -SLP following -Cortrak in place -On Tube feeds and FW  Aspiration Pneumonia -Vomited in route w/EMS -Febrile: Tmax 101.2->afebrile  -Leukocytosis: WBC 19.7 -> 21.2 ->12.2->11.4->13.8-->15.1-->18.1-->19.3->18.5 ->pending -CXR bibasilar opacities -Completed 5 days of ceftriaxone   -Currently on vancomycin since 3/9.  Appreciate CCM           and pharmacy assistance. -Sputum culture (2/28): Negative -Aggressive pulmonary toilet on floor (IS q1 hour while           awake, flutter valve, pulm toilet q 4hrs)  Concern for MRSA bacteremia -Patient developed persistent fever and worsening                 leukocytosis 3/9, this prompted obtaining repeat blood          cultures and broadening IV antibiotics to include                    vancomycin  -Repeat blood cultures resulted positive for staph      epidermidis with methicillin resistance in aerobic            bottle  -S/p 5-day course of ceftriaxone with continued                      leukocytosis/fever -T-max 100.6 overnight, flat fever curve  -Leukocytosis:             15.1->18.4->19.3->19->18.5->pending -On Zosyn/Vanc -Repeat blood cultures are pending -PICC line removed 3/10 -Appreciate CCM management   Acute hypoxic respiratory failure -Likely related to aspiration, urgently intubated 3/4,       extubated 3/7-resolved.  -CCM had a conversation with family regarding re-       intubation-they need some time to think about goals of care.  We will continue full scope of care for now.  -Appreciate CCM assistance  Left arm and bilateral lower extremity swelling -Check Dopplers bilateral lower extremity and left upper extremity-reviewed-negative for DVT.  Other Stroke Risk Factors -Advanced Age >/= 50    Hospital day # 17   Patient`s neurological condition remains stable but has not improved significantly since admission hence overall prognosis remains guarded.  Continue antibiotics and follow blood cultures no other cause of encephalopathy except slightly elevated ammonia possible sepsis.  Palliative care consult would be appropriate to determine goals of care and discussion with family.  Discussed with Dr. Allena Katz.  No family at the bedside.  Greater than 50% time during this 25-minute visit was spent on counseling  and coordination of care team.  Delia Heady, MD   Delia Heady, MD Medical Director [facility] Pager: [phone] 07/02/2020 3:30 PM    To contact Stroke Continuity provider, please refer to [email or web address]. After hours, contact General Neurology

## 2020-07-02 NOTE — Consult Note (Signed)
[facility]    Date of Admission:  06/15/2020     Total days of antibiotics 14               Reason for Consult: Fever / Leukocytosis    Referring Provider: Allena KatzPatel Primary Care Provider: Kaleen MaskElkins, Wilson Oliver, MD   ASSESSMENT:  Calvin Yates is an 86 y/o male admitted with ICH and concern for aspiration pneumonia. Course has been complicated by continued low grade fevers and leukocytosis. Blood cultures from 3/9 were Methicillin resistant Staphylococcus epidermidis in 2 out of 4 bottles likely reflecting contamination. Concern for infection of PICC line which was removed on 3/10. ICH/neurological fevers may also be a contributing factor. Remains at high risk for continued aspiration with decreased mental status. Recommend continuation of vancomycin and will add Unasyn for a total of 3 more days ending on 3/17 and stop antibiotics.   PLAN:  1. Continue vancomycin and add Unasyn. 2. Stop antibiotics on 3/17 which is 7 days post line removal.  3. Stroke care per primary team.  4. ID will sign off.    Active Problems:   ICH (intracerebral hemorrhage) (HCC)   Fever   Hypoxia   Aspiration into airway   Intubation of airway performed without difficulty   Respiratory abnormalities   . amLODipine  10 mg Per Tube Daily  . carvedilol  25 mg Per Tube BID  . chlorhexidine  15 mL Mouth/Throat QID  . Chlorhexidine Gluconate Cloth  6 each Topical Daily  . enoxaparin (LOVENOX) injection  40 mg Subcutaneous Daily  . feeding supplement (PROSource TF)  45 mL Per Tube BID  . free water  200 mL Per Tube Q6H  . guaiFENesin  5 mL Per Tube Q12H  . lisinopril  5 mg Per Tube Daily  . mouth rinse  15 mL Mouth Rinse q12n4p  . multivitamin with minerals  1 tablet Per Tube Daily  . pantoprazole sodium  40 mg Per Tube QHS  . sodium chloride flush  10-40 mL Intracatheter Q12H     HPI: Calvin Yates is a 86 y.o. male with no significant past medical history admitted after  being found down at home and hypertensive, transient hypoxia and one episode of vomiting with EMS.   Code Stroke CT with large intraparenchymal hemorrhage of the right insula with surrounding edema and mass effect on the lateral ventricle. Was able to protect his airway with concern for possible aspiration pneumonia. Received 5 days of ceftriaxone followed by Pip/Tazo. Vancomycin was added on 3/9 secondary to leukocytosis and waxing and waining fevers. Blood cultures on 3/9 with methicillin resistant Staphylococcus epidermidis in 2/4 bottles. Repeat blood cultures on 06/30/20 without growth to date. Had a PICC line that was placed on 3/1 which was removed on 3/10. Currently on vancomycin.   Calvin Yates is able to follow basic commands on the right side during evaluation. History is primarily obtained from chart review.    Review of Systems: Review of Systems  Unable to perform ROS: Mental status change     No past medical history on file.  Social History   Tobacco Use  . Smoking status: Never Smoker  . Smokeless tobacco: Never Used  Vaping Use  . Vaping Use: Never used  Substance Use Topics  . Alcohol use: Not Currently  . Drug use: Never    Family History  Problem Relation Age of Onset  . Hypertension Mother   . Hypercholesterolemia Mother   .  Heart disease Mother     Allergies  Allergen Reactions  . Amoxicillin Rash    OBJECTIVE: Blood pressure 132/64, pulse 83, temperature 99.2 F (37.3 C), temperature source Oral, resp. rate (!) 22, height 5\' 8"  (1.727 m), weight 81.3 kg, SpO2 97 %.  Physical Exam Constitutional:      General: He is not in acute distress.    Appearance: He is well-developed.     Comments: Lying in bed with head of bed elevated.   Cardiovascular:     Rate and Rhythm: Normal rate and regular rhythm.     Heart sounds: Normal heart sounds.  Pulmonary:     Effort: Pulmonary effort is normal.     Breath sounds: Normal breath sounds.  Skin:    General:  Skin is warm and dry.  Neurological:     Mental Status: He is oriented to person, place, and time.     Comments: Follows basic commands on right side. Left sided neglect. 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   Psychiatric:        Behavior: Behavior normal.        Thought Content: Thought content normal.        Judgment: Judgment normal.     Lab Results Lab Results  Component Value Date   WBC 18.4 (H) 07/02/2020   HGB 10.5 (L) 07/02/2020   HCT 31.0 (L) 07/02/2020   MCV 97.2 07/02/2020   PLT 298 07/02/2020    Lab Results  Component Value Date   CREATININE 0.88 07/02/2020   BUN 30 (H) 07/02/2020   NA 137 07/02/2020   K 4.1 07/02/2020   CL 112 (H) 07/02/2020   CO2 22 07/02/2020    Lab Results  Component Value Date   ALT 44 06/30/2020   AST 26 06/30/2020   ALKPHOS 52 06/30/2020   BILITOT 0.6 06/30/2020     Microbiology: Recent Results (from the past 240 hour(s))  Culture, blood (routine x 2)     Status: None   Collection Time: 06/24/20  7:56 AM   Specimen: BLOOD  Result Value Ref Range Status   Specimen Description BLOOD LEFT ANTECUBITAL  Final   Special Requests   Final    BOTTLES DRAWN AEROBIC AND ANAEROBIC Blood Culture results may not be optimal due to an inadequate volume of blood received in culture bottles   Culture   Final    NO GROWTH 5 DAYS Performed at [facility], [address] [place], [place]    Report Status 06/29/2020 FINAL  Final  Culture, blood (routine x 2)     Status: None   Collection Time: 06/24/20  8:09 AM   Specimen: BLOOD  Result Value Ref Range Status   Specimen Description BLOOD LEFT ANTECUBITAL  Final   Special Requests   Final    BOTTLES DRAWN AEROBIC ONLY Blood Culture results may not be optimal due to an inadequate volume of blood received in culture bottles   Culture   Final    NO GROWTH 5 DAYS Performed at [facility], [address] [place], [place]    Report Status 06/29/2020  FINAL  Final  Culture, blood (routine x 2)     Status: Abnormal   Collection Time: 06/27/20 10:52 AM   Specimen: BLOOD LEFT HAND  Result Value Ref Range Status   Specimen Description BLOOD LEFT HAND  Final   Special Requests   Final    BOTTLES DRAWN AEROBIC  ONLY Blood Culture adequate volume   Culture  Setup Time   Final    GRAM POSITIVE COCCI IN CLUSTERS AEROBIC BOTTLE ONLY CRITICAL RESULT CALLED TO, READ BACK BY AND VERIFIED WITH: Ihor Austin PharmD 12:20 06/28/20 ([email or web address]) Performed at [facility], [address] [place], [place] [address]    Culture STAPHYLOCOCCUS EPIDERMIDIS (A)  Final   Report Status 06/30/2020 FINAL  Final   Organism ID, Bacteria STAPHYLOCOCCUS EPIDERMIDIS  Final      Susceptibility   Staphylococcus epidermidis - MIC*    CIPROFLOXACIN >=8 RESISTANT Resistant     ERYTHROMYCIN >=8 RESISTANT Resistant     GENTAMICIN <=0.5 SENSITIVE Sensitive     OXACILLIN >=4 RESISTANT Resistant     TETRACYCLINE 4 SENSITIVE Sensitive     VANCOMYCIN <=0.5 SENSITIVE Sensitive     TRIMETH/SULFA 80 RESISTANT Resistant     CLINDAMYCIN >=8 RESISTANT Resistant     RIFAMPIN <=0.5 SENSITIVE Sensitive     Inducible Clindamycin NEGATIVE Sensitive     * STAPHYLOCOCCUS EPIDERMIDIS  Culture, blood (routine x 2)     Status: Abnormal   Collection Time: 06/27/20 10:52 AM   Specimen: BLOOD LEFT HAND  Result Value Ref Range Status   Specimen Description BLOOD LEFT HAND  Final   Special Requests   Final    BOTTLES DRAWN AEROBIC ONLY Blood Culture adequate volume   Culture  Setup Time   Final    GRAM POSITIVE COCCI AEROBIC BOTTLE ONLY CRITICAL VALUE NOTED.  VALUE IS CONSISTENT WITH PREVIOUSLY REPORTED AND CALLED VALUE.    Culture (A)  Final    STAPHYLOCOCCUS EPIDERMIDIS SUSCEPTIBILITIES PERFORMED ON PREVIOUS CULTURE WITHIN THE LAST 5 DAYS. Performed at [facility], [address] [place], [place] [address]    Report Status 06/30/2020 FINAL  Final  Blood Culture ID Panel  (Reflexed)     Status: Abnormal   Collection Time: 06/27/20 10:52 AM  Result Value Ref Range Status   Enterococcus faecalis NOT DETECTED NOT DETECTED Final   Enterococcus Faecium NOT DETECTED NOT DETECTED Final   Listeria monocytogenes NOT DETECTED NOT DETECTED Final   Staphylococcus species DETECTED (A) NOT DETECTED Final    Comment: CRITICAL RESULT CALLED TO, READ BACK BY AND VERIFIED WITH: Ihor Austin PharmD 12:20 06/28/20 ([email or web address])    Staphylococcus aureus (BCID) NOT DETECTED NOT DETECTED Final   Staphylococcus epidermidis DETECTED (A) NOT DETECTED Final    Comment: Methicillin (oxacillin) resistant coagulase negative staphylococcus. Possible blood culture contaminant (unless isolated from more than one blood culture draw or clinical case suggests pathogenicity). No antibiotic treatment is indicated for blood  culture contaminants. CRITICAL RESULT CALLED TO, READ BACK BY AND VERIFIED WITH: Ihor Austin PharmD 12:20 06/28/20 ([email or web address])    Staphylococcus lugdunensis NOT DETECTED NOT DETECTED Final   Streptococcus species NOT DETECTED NOT DETECTED Final   Streptococcus agalactiae NOT DETECTED NOT DETECTED Final   Streptococcus pneumoniae NOT DETECTED NOT DETECTED Final   Streptococcus pyogenes NOT DETECTED NOT DETECTED Final   A.calcoaceticus-baumannii NOT DETECTED NOT DETECTED Final   Bacteroides fragilis NOT DETECTED NOT DETECTED Final   Enterobacterales NOT DETECTED NOT DETECTED Final   Enterobacter cloacae complex NOT DETECTED NOT DETECTED Final   Escherichia coli NOT DETECTED NOT DETECTED Final   Klebsiella aerogenes NOT DETECTED NOT DETECTED Final   Klebsiella oxytoca NOT DETECTED NOT DETECTED Final   Klebsiella pneumoniae NOT DETECTED NOT DETECTED Final   Proteus species NOT DETECTED NOT DETECTED Final   Salmonella species NOT DETECTED NOT  DETECTED Final   Serratia marcescens NOT DETECTED NOT DETECTED Final   Haemophilus influenzae NOT DETECTED NOT DETECTED Final   Neisseria  meningitidis NOT DETECTED NOT DETECTED Final   Pseudomonas aeruginosa NOT DETECTED NOT DETECTED Final   Stenotrophomonas maltophilia NOT DETECTED NOT DETECTED Final   Candida albicans NOT DETECTED NOT DETECTED Final   Candida auris NOT DETECTED NOT DETECTED Final   Candida glabrata NOT DETECTED NOT DETECTED Final   Candida krusei NOT DETECTED NOT DETECTED Final   Candida parapsilosis NOT DETECTED NOT DETECTED Final   Candida tropicalis NOT DETECTED NOT DETECTED Final   Cryptococcus neoformans/gattii NOT DETECTED NOT DETECTED Final   Methicillin resistance mecA/C DETECTED (A) NOT DETECTED Final    Comment: CRITICAL RESULT CALLED TO, READ BACK BY AND VERIFIED WITH: Ihor Austin PharmD 12:20 06/28/20 ([email or web address]) Performed at [facility], [address], [place], [place] [address]   Culture, blood (routine x 2)     Status: None (Preliminary result)   Collection Time: 06/30/20 12:07 PM   Specimen: BLOOD  Result Value Ref Range Status   Specimen Description BLOOD SITE NOT SPECIFIED  Final   Special Requests   Final    BOTTLES DRAWN AEROBIC ONLY Blood Culture results may not be optimal due to an inadequate volume of blood received in culture bottles   Culture   Final    NO GROWTH 2 DAYS Performed at [facility], [address] [place], [place] [address]    Report Status PENDING  Incomplete  Culture, blood (routine x 2)     Status: None (Preliminary result)   Collection Time: 06/30/20 12:07 PM   Specimen: BLOOD  Result Value Ref Range Status   Specimen Description BLOOD SITE NOT SPECIFIED  Final   Special Requests   Final    BOTTLES DRAWN AEROBIC ONLY Blood Culture results may not be optimal due to an inadequate volume of blood received in culture bottles   Culture   Final    NO GROWTH 2 DAYS Performed at [facility], [address] [place], [place] [address]    Report Status PENDING  Incomplete     Marcos Eke, NP [facility]  07/02/2020  1:25 PM

## 2020-07-02 NOTE — Progress Notes (Signed)
[facility] Progress Note  Patient: Calvin Yates    MBW:[id number]  DOA: 06/15/2020     Date of Service: the patient was seen and examined on 07/02/2020  Brief hospital course: No significant past medical history.  Presents with sudden onset of unresponsiveness. LKW 2/25 1800 at dinner with wife, found down 1815 with SBP > 200, found to have 7cm R frontoparietal ICH without midline shift, L-sided deficits. PCCM consulted for worsening encephalopathy/concern for respiratory failure. 2/25 - Admit 3/03 - Intubated 3/07 - Extubated to 4LNC 3/08 - ?Seizure activity, STAT EEG per Neuro (unchanged from prior), LTM 3/09 - Persistent fever with new worsening leukocytosis, blood cultures repeated and vancomycin initiated  3/11 - Blood cultures positive for staph epidermidis with methicillin resistance in aerobic bottle only 3/14 - ID consulted for persistent fever.  Currently plan is continue antibiotic, monitor for improvement in encephalopathy.   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  Repeat CT scan shows mild edema. Neurological condition remained stable but no improvement since admission hence per neurology overall prognosis remains guarded. CT head shows innumerable foci of hemosiderin deposition scattered throughout the brain.  Differential includes amyloid angiopathy or hypertensive vascular disease. No antiplatelet or anticoagulation medication LDL 103, not on any statin secondary to ICH.Marland Kitchen PT OT recommends CIR although patient remains not a good CI candidate right now. Speech therapy recommends n.p.o. status.  Acute hypoxic respiratory failure requiring MV Aspiration pneumonia Persistent fever. Per report, vomited en route to hospital. Respiratory culture with  normal respiratory flora.  Required urgent intubation overnight 3/4. Mental status improved and patient was successfully extubated 3/7. Head of bed elevated 30 degrees. Ensure adequate pulmonary hygiene   ID consult appreciated. We will continue with IV antibiotics vancomycin and Unasyn through 3/17.  Staph epidermidis bacteremia. Concern for MRSA bacteremia-ruled out Patient developed persistent fever and worsening  leukocytosis 3/9, this prompted obtaining repeat blood cultures and broadening IV antibiotics to include vancomycin  Repeat blood cultures resulted positive for staph epidermidis with methicillin resistance in aerobic bottle only on 3/11  Fever curve remains flat Leukocytosis still present Continue vancomycin. Repeat blood cultures on 3/12.  Febrile on 3/13. Also echocardiogram Doppler negative for vegetation or infection. PICC line removed 3/11 Continue antibiotics through 3/17.  Hypertensive emergency associated with intracranial hemorrhage> now controlled SBP on EMS arrival reportedly > 200. Echo 2/27 LVEF 70 to 75% and grade 1 diastolic dysfunction. Initially treated with Cleviprex infusion. Continue PO amlodipine, coreg, and lisinopril  Long-term BP goal less than 140 systolic. PRN IV antihypertensives   Hypergylcemia  Controlled, has not been requiring insulin Goal CBG 140-180 Continue to monitor   Acute anemia, likely due to acute illness Trend CBC Transfuse per protocol  Hgb goal < 7  Physical deconditioning Continue PT/OT/SLP efforts   Possible focal R seizure activity Questionable focal RLE "twitching" noted 3/8PM. Neuro with recommendation for STAT EEG which was grossly unremarkable. EEG negative for seizure activity 3/9  Advance goals of care discussion: DNR/DNI.  Discussed with daughter on the phone regarding patient's poor prognosis.  Palliative care was consulted.  Family meeting on Tuesday. Appreciate assistance from palliative  care.  Concern for C-spine injury-ruled out Hypophosphatemia-corrected  Body mass index is 27.25 kg/m.  Nutrition Problem: Inadequate oral intake Etiology: inability to eat Interventions: Interventions: Tube feeding,Prostat,MVI  Diet: NPO.  On tube feeding. DVT Prophylaxis: Started per neurology on 2/28 enoxaparin (LOVENOX) injection 40 mg Start: 06/18/20 1045 Place and maintain sequential compression device Start: 06/15/20 2107 SCD's Start: 06/15/20 2059  Family Communication: no family was present at bedside, at the time of interview.  Discussed with daughter on the phone on 3/13  Disposition:  Status is: Inpatient  Remains inpatient appropriate because:Inpatient level of care appropriate due to severity of illness  Dispo: The patient is from: Home              Anticipated d/c is to: to be determined               Patient currently is not medically stable to d/c.   Difficult to place patient No  Subjective: Minimally responsive.  No nausea no vomiting.  Has increasing cough.  Physical Exam:  General: Appear in mild distress, no Rash; Oral Mucosa dry with dried blood. no Abnormal Neck Mass Or lumps, Conjunctiva normal  Cardiovascular: S1 and S2 Present, no Murmur, Respiratory: Increased respiratory effort, Bilateral Air entry present and CTA, occasional crackles, no wheezes Abdomen: Bowel Sound present, Soft and no tenderness Extremities: no Pedal edema Neurology: Lethargic, not oriented, nonverbal, not following to follow commands.    Vitals:   07/02/20 0800 07/02/20 1159 07/02/20 1615 07/02/20 2014  BP: 121/67 132/64 (!) 123/54 (!) 143/52  Pulse:  83 83 88  Resp:  (!) 22 20 (!) 21  Temp:  99.2 F (37.3 C) 98.7 F (37.1 C) 98.3 F (36.8 C)  TempSrc:  Oral  Oral  SpO2:  97% 97% 96%  Weight:      Height:        Intake/Output Summary (Last 24 hours) at 07/02/2020 2043 Last data filed at 07/02/2020 8299 Gross per 24 hour  Intake 850 ml  Output 876 ml  Net -26  ml   Filed Weights   06/30/20 0500 07/01/20 0500 07/02/20 0631  Weight: 80.5 kg 81.7 kg 81.3 kg    Data Reviewed: I have personally reviewed and interpreted daily labs, tele strips, imaging. I reviewed all nursing notes, pharmacy notes, vitals, pertinent old records I have discussed plan of care as described above with RN and patient/family.  CBC: Recent Labs  Lab 06/26/20 0805 06/27/20 0318 06/28/20 0825 06/29/20 0108 06/30/20 1207 07/01/20 0412 07/02/20 0232  WBC 18.4*   < > 19.0* 18.5* 20.9* 18.2* 18.4*  NEUTROABS 14.6*  --   --   --  17.5*  --   --   HGB 10.5*   < > 10.4* 10.3* 10.8* 10.7* 10.5*  HCT 33.6*   < > 32.6* 30.8* 32.9* 31.2* 31.0*  MCV 101.8*   < > 101.6* 97.5 98.5 96.9 97.2  PLT 219   < > 216 232 263 288 298   < > = values in this interval not displayed.   Basic Metabolic Panel: Recent Labs  Lab 06/27/20 0318 06/28/20 0825 06/29/20 0108 06/30/20 1207 07/01/20 0412 07/02/20 0232  NA 146* 141 142 139 137 137  K 4.0 3.8 4.0 4.3 4.1 4.1  CL 115* 113* 114* 112* 111 112*  CO2 22 23 21* 19* 22 22  GLUCOSE 134* 165* 146* 163* 145* 154*  BUN 29* 29* 32* 28* 28* 30*  CREATININE 0.98 0.94 0.96 0.74 0.86 0.88  CALCIUM 8.1* 7.8* 7.6* 7.7* 7.6* 7.6*  MG 2.4  --   --  2.3 2.5* 2.5*  PHOS 3.4  --   --  2.8  --   --  Studies: No results found.  Scheduled Meds: . amLODipine  10 mg Per Tube Daily  . carvedilol  25 mg Per Tube BID  . chlorhexidine  15 mL Mouth/Throat QID  . Chlorhexidine Gluconate Cloth  6 each Topical Daily  . enoxaparin (LOVENOX) injection  40 mg Subcutaneous Daily  . feeding supplement (PROSource TF)  45 mL Per Tube BID  . free water  200 mL Per Tube Q6H  . guaiFENesin  5 mL Per Tube Q12H  . lisinopril  5 mg Per Tube Daily  . mouth rinse  15 mL Mouth Rinse q12n4p  . multivitamin with minerals  1 tablet Per Tube Daily  . pantoprazole sodium  40 mg Per Tube QHS  . sodium chloride flush  10-40 mL Intracatheter Q12H   Continuous  Infusions: . sodium chloride 10 mL/hr at 06/30/20 0400  . ampicillin-sulbactam (UNASYN) IV 3 g (07/02/20 1459)  . feeding supplement (OSMOLITE 1.5 CAL) 1,000 mL (07/01/20 1945)  . vancomycin 1,500 mg (07/02/20 1155)   PRN Meds: sodium chloride, acetaminophen **OR** acetaminophen (TYLENOL) oral liquid 160 mg/5 mL **OR** acetaminophen, bisacodyl, hydrALAZINE, labetalol, ondansetron (ZOFRAN) IV, sodium chloride flush  Time spent: 35 minutes  Author: Lynden Oxford, MD [facility] 07/02/2020 8:43 PM  To reach On-call, see care teams to locate the attending and reach out via [email or web address]. Between 7PM-7AM, please contact night-coverage If you still have difficulty reaching the attending provider, please page the [facility] (Director on Call) for [facility] on amion for assistance.

## 2020-07-02 NOTE — Progress Notes (Signed)
Pharmacy Antibiotic Note  Calvin Yates is a 86 y.o. male admitted on 06/15/2020 with pneumonia. Now found to have MRSE bacteremia thought to be related to PICC line - removed 3/10.  Pharmacy has been consulted to dose Vancomycin.  Vancomycin levels from 3/13 + 3/14 were 34 mcg/ml and 15 mcg/ml with a calculated AUC of 430 which is therapeutic (Goal AUC 400-550). Renal function appears stable - no dose adjustments needed at this time.   Plan: - Continue Vancomycin 1500 mg IV every 24 hours - F/u plans for LOT - consider 7d from line removal on 3/10 - Will continue to follow renal function, culture results, LOT, and antibiotic de-escalation plans   Height: 5\' 8"  (172.7 cm) Weight: 81.3 kg (179 lb 3.7 oz) IBW/kg (Calculated) : 68.4  Temp (24hrs), Avg:99.2 F (37.3 C), Min:98.2 F (36.8 C), Max:101.6 F (38.7 C)  Recent Labs  Lab 06/28/20 0825 06/29/20 0108 06/30/20 1207 07/01/20 0412 07/01/20 1625 07/02/20 0232  WBC 19.0* 18.5* 20.9* 18.2*  --  18.4*  CREATININE 0.94 0.96 0.74 0.86  --  0.88  VANCOPEAK  --   --   --   --  34  --   VANCORANDOM  --   --   --   --   --  15    Estimated Creatinine Clearance: 60.5 mL/min (by C-G formula based on SCr of 0.88 mg/dL).    Allergies  Allergen Reactions  . Amoxicillin Rash    CTX 2/28 >> 3/3 Zosyn 3/6 >>3/11 Vanc 3/9 >>  2/25 MRSA PCR - negative 2/26 BCx - negative 2/28 TA - negative 3/6 BCx - negative 3/9 BCx - staph epi with MR 2/4 3/12 BCx >> ngtd  Thank you for allowing pharmacy to be a part of this patient's care.  5/12, PharmD, BCPS Clinical Pharmacist Clinical phone for 07/02/2020: [phone] 07/02/2020 9:00 AM   **Pharmacist phone directory can now be found on [email or web address] ([email or web address]).  Listed under [facility].

## 2020-07-02 NOTE — Hospital Course (Signed)
2/25 - Admit 3/03 - Intubated 3/07 - Extubated to 4LNC 3/08 - ?Seizure activity, STAT EEG per Neuro (unchanged from prior), LTM 3/09 - Persistent fever with new worsening leukocytosis, blood cultures repeated and vancomycin initiated  3/11 - Blood cultures positive for staph epidermidis with methicillin resistance in aerobic bottle only

## 2020-07-02 NOTE — Progress Notes (Signed)
Inpatient Rehabilitation Admissions Coordinator  I am following his case from a distance. Noted family meeting for Tuesday. Patient not at  level for CIR admit at this time.  Ottie Glazier, RN, MSN Rehab Admissions Coordinator [phone] 07/02/2020 10:55 AM

## 2020-07-02 NOTE — Progress Notes (Signed)
Physical Therapy Treatment Patient Details Name: Calvin Yates MRN: [id number] DOB: September 26, 1934 Today's Date: 07/02/2020    History of Present Illness 86 yo male presenting with elevated BP and tachycardia after being found down by his wife. 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    PT Comments    Today's skilled session focused on LE strengthening with minimal pt participation.  Acute PT to continue during pt's hospital stay.   Follow Up Recommendations  CIR     Equipment Recommendations  None recommended by PT    Recommendations for Other Services Rehab consult     Precautions / Restrictions Precautions Precautions: Fall Precaution Comments: c-spine cleared, no need for c-collar Restrictions Weight Bearing Restrictions: No    Mobility  Bed Mobility Overal bed mobility: Needs Assistance             General bed mobility comments: pt lying rotated in bed on left rail. total assist to repostion trunk and LE's into a more midline position.     Modified Rankin (Stroke Patients Only) Modified Rankin (Stroke Patients Only) Pre-Morbid Rankin Score: No symptoms Modified Rankin: Severe disability     Cognition Arousal/Alertness: Lethargic Behavior During Therapy: Restless;Flat affect Overall Cognitive Status: Impaired/Different from baseline Area of Impairment: Orientation;Attention;Following commands;Safety/judgement;Awareness;Problem solving                 Orientation Level:  (unable to answer orientation questions today) Current Attention Level: Focused;Sustained   Following Commands: Follows one step commands with increased time;Follows one step commands inconsistently Safety/Judgement: Decreased awareness of safety;Decreased awareness of deficits Awareness: Intellectual Problem Solving: Slow processing;Decreased initiation;Difficulty sequencing;Requires verbal cues;Requires tactile cues General  Comments: Pt did not verbalize or respond to PTA this session, mumbled incoherrently at times. Did not track PTA in room. Did assist with ex's at times during session.      Exercises General Exercises - Lower Extremity Ankle Circles/Pumps: PROM;AAROM;Both;10 reps;Supine Short Arc Quad: PROM;AAROM;Strengthening;Both;10 reps;Supine Heel Slides: PROM;AAROM;Strengthening;Both;10 reps;Supine Hip ABduction/ADduction: PROM;AAROM;Strengthening;Both;10 reps;Supine Straight Leg Raises: PROM;AAROM;Strengthening;Both;10 reps;Supine     Pertinent Vitals/Pain Pain Assessment: No/denies pain Pain Score: 0-No pain     PT Goals (current goals can now be found in the care plan section) Acute Rehab PT Goals Patient Stated Goal: none stated PT Goal Formulation: Patient unable to participate in goal setting Time For Goal Achievement: 07/11/20 Potential to Achieve Goals: Fair Progress towards PT goals: Progressing toward goals    Frequency    Min 4X/week      PT Plan Current plan remains appropriate;Other (comment) (may need to be updated after family meeting tomorrow)    AM-PAC PT "6 Clicks" Mobility   Outcome Measure  Help needed turning from your back to your side while in a flat bed without using bedrails?: Total Help needed moving from lying on your back to sitting on the side of a flat bed without using bedrails?: Total Help needed moving to and from a bed to a chair (including a wheelchair)?: Total Help needed standing up from a chair using your arms (e.g., wheelchair or bedside chair)?: Total Help needed to walk in hospital room?: Total Help needed climbing 3-5 steps with a railing? : Total 6 Click Score: 6    End of Session   Activity Tolerance: Patient limited by lethargy;Other (comment) (pt also noted to have heavy respirations with secretions, RN/MD aware) Patient left: in bed;with call bell/phone within reach;with bed alarm set Nurse Communication: Mobility status PT Visit  Diagnosis:  Hemiplegia and hemiparesis;Other abnormalities of gait and mobility (R26.89);Muscle weakness (generalized) (M62.81) Hemiplegia - Right/Left: Left Hemiplegia - dominant/non-dominant: Non-dominant Hemiplegia - caused by: Nontraumatic intracerebral hemorrhage     Time: 0350-0938 PT Time Calculation (min) (ACUTE ONLY): 12 min  Charges:  $Therapeutic Exercise: 8-22 mins                    Sallyanne Kuster, PTA, [facility] Acute Rehab Services Office- [phone] 07/02/20, 1:37 PM  Sallyanne Kuster 07/02/2020, 1:35 PM

## 2020-07-03 DIAGNOSIS — Z515 Encounter for palliative care: Secondary | ICD-10-CM | POA: Diagnosis not present

## 2020-07-03 DIAGNOSIS — Z7189 Other specified counseling: Secondary | ICD-10-CM

## 2020-07-03 DIAGNOSIS — I611 Nontraumatic intracerebral hemorrhage in hemisphere, cortical: Secondary | ICD-10-CM | POA: Diagnosis not present

## 2020-07-03 LAB — BASIC METABOLIC PANEL
Anion gap: 5 (ref 5–15)
BUN: 30 mg/dL — ABNORMAL HIGH (ref 8–23)
CO2: 24 mmol/L (ref 22–32)
Calcium: 7.8 mg/dL — ABNORMAL LOW (ref 8.9–10.3)
Chloride: 112 mmol/L — ABNORMAL HIGH (ref 98–111)
Creatinine, Ser: 0.88 mg/dL (ref 0.61–1.24)
GFR, Estimated: >60 mL/min (ref >60)
Glucose, Bld: 141 mg/dL — ABNORMAL HIGH (ref 70–99)
Potassium: 4.5 mmol/L (ref 3.5–5.1)
Sodium: 141 mmol/L (ref 135–145)

## 2020-07-03 LAB — GLUCOSE, CAPILLARY
Glucose-Capillary: 140 mg/dL — ABNORMAL HIGH (ref 70–99)
Glucose-Capillary: 143 mg/dL — ABNORMAL HIGH (ref 70–99)
Glucose-Capillary: 146 mg/dL — ABNORMAL HIGH (ref 70–99)

## 2020-07-03 LAB — CBC
HCT: 31.9 % — ABNORMAL LOW (ref 39.0–52.0)
Hemoglobin: 10.5 g/dL — ABNORMAL LOW (ref 13.0–17.0)
MCH: 32 pg (ref 26.0–34.0)
MCHC: 32.9 g/dL (ref 30.0–36.0)
MCV: 97.3 fL (ref 80.0–100.0)
Platelets: 302 10*3/uL (ref 150–400)
RBC: 3.28 MIL/uL — ABNORMAL LOW (ref 4.22–5.81)
RDW: 12.6 % (ref 11.5–15.5)
WBC: 17.2 10*3/uL — ABNORMAL HIGH (ref 4.0–10.5)
nRBC: 0 % (ref 0.0–0.2)

## 2020-07-03 LAB — MAGNESIUM: Magnesium: 2.4 mg/dL (ref 1.7–2.4)

## 2020-07-03 MED ORDER — HALOPERIDOL LACTATE 5 MG/ML IJ SOLN: 0.5000 mg | INTRAMUSCULAR | Status: DC | PRN

## 2020-07-03 MED ORDER — HALOPERIDOL 0.5 MG PO TABS: 0.5000 mg | ORAL_TABLET | ORAL | Status: DC | PRN

## 2020-07-03 MED ORDER — HALOPERIDOL LACTATE 2 MG/ML PO CONC
2.0000 mg | ORAL | Status: DC | PRN
  Filled 2020-07-03: qty 1

## 2020-07-03 MED ORDER — BIOTENE DRY MOUTH MT LIQD: 15.0000 mL | OROMUCOSAL | Status: DC | PRN

## 2020-07-03 MED ORDER — LORAZEPAM 2 MG/ML IJ SOLN: 1.0000 mg | Freq: Four times a day (QID) | INTRAMUSCULAR | Status: DC | PRN

## 2020-07-03 MED ORDER — HALOPERIDOL 0.5 MG PO TABS: 2.0000 mg | ORAL_TABLET | ORAL | Status: DC | PRN

## 2020-07-03 MED ORDER — HALOPERIDOL LACTATE 5 MG/ML IJ SOLN
2.0000 mg | INTRAMUSCULAR | Status: DC | PRN
  Administered 2020-07-04: 2 mg via INTRAVENOUS
  Filled 2020-07-03: qty 1

## 2020-07-03 MED ORDER — GLYCOPYRROLATE 0.2 MG/ML IJ SOLN
0.4000 mg | Freq: Three times a day (TID) | INTRAMUSCULAR | Status: DC
  Administered 2020-07-03 – 2020-07-05 (×7): 0.4 mg via INTRAVENOUS
  Filled 2020-07-03 (×7): qty 2

## 2020-07-03 MED ORDER — HALOPERIDOL LACTATE 2 MG/ML PO CONC
0.5000 mg | ORAL | Status: DC | PRN
  Filled 2020-07-03: qty 0.3

## 2020-07-03 MED ORDER — GLYCOPYRROLATE 0.2 MG/ML IJ SOLN: 0.2000 mg | INTRAMUSCULAR | Status: DC | PRN

## 2020-07-03 MED ORDER — GLYCOPYRROLATE 0.2 MG/ML IJ SOLN
0.2000 mg | INTRAMUSCULAR | Status: DC | PRN
  Filled 2020-07-03: qty 1

## 2020-07-03 MED ORDER — MORPHINE SULFATE (PF) 2 MG/ML IV SOLN: 1.0000 mg | INTRAVENOUS | Status: DC | PRN

## 2020-07-03 MED ORDER — GLYCOPYRROLATE 1 MG PO TABS
1.0000 mg | ORAL_TABLET | ORAL | Status: DC | PRN
  Filled 2020-07-03: qty 1

## 2020-07-03 MED ORDER — POLYVINYL ALCOHOL 1.4 % OP SOLN
1.0000 [drp] | Freq: Four times a day (QID) | OPHTHALMIC | Status: DC | PRN
  Filled 2020-07-03: qty 15

## 2020-07-03 MED ORDER — MORPHINE SULFATE (PF) 2 MG/ML IV SOLN
1.0000 mg | INTRAVENOUS | Status: DC | PRN
  Filled 2020-07-03: qty 1

## 2020-07-03 NOTE — Progress Notes (Signed)
Physical Therapy Treatment Patient Details Name: Calvin Yates MRN: [id number] DOB: 04-18-1935 Today's Date: 07/03/2020    History of Present Illness 86 yo male presenting with elevated BP and tachycardia after being found down by his wife. 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    PT Comments    Pt lethargic and difficult to arouse. Finally able to arouse with LLE range of motion which seemed to cause discomfort. Even after pt aroused he is total assist for any movement and was not following commands. Do not feel like pt can tolerate CIR level therapies and have updated recommendations to SNF. Expect pt will require significant physcial assist for the foreseeable future.    Follow Up Recommendations  SNF     Equipment Recommendations  Hospital bed;Wheelchair (measurements PT) (hoyer lift)    Recommendations for Other Services Rehab consult     Precautions / Restrictions Precautions Precautions: Fall    Mobility  Bed Mobility Overal bed mobility: Needs Assistance             General bed mobility comments: Placed bed in chair position. Total assist to bring trunk forward from bed into more upright sitting    Transfers                 General transfer comment: Not attempted  Ambulation/Gait                 Stairs             Wheelchair Mobility    Modified Rankin (Stroke Patients Only) Modified Rankin (Stroke Patients Only) Pre-Morbid Rankin Score: No symptoms Modified Rankin: Severe disability     Balance Overall balance assessment: Needs assistance   Sitting balance-Leahy Scale: Zero Sitting balance - Comments: total assist to keep trunk upright                                    Cognition Arousal/Alertness: Lethargic Behavior During Therapy: Flat affect Overall Cognitive Status: Difficult to assess                                 General Comments:  Difficult to arouse and when he did arouse not following commands.      Exercises General Exercises - Lower Extremity Ankle Circles/Pumps: PROM;Both;Supine;5 reps;AAROM (Passive lt and AAROM rt.) Short Arc Quad: PROM;AAROM;Both;5 reps;Seated (Passive lt and active assist on rt) Heel Slides: PROM;Both;Supine;5 reps;AAROM (Passisve lt and active assist on rt) Hip ABduction/ADduction: PROM;AAROM;Both;Supine;5 reps    General Comments        Pertinent Vitals/Pain Pain Assessment: Faces Faces Pain Scale: Hurts a little bit Pain Location: LLE with PROM Pain Descriptors / Indicators: Grimacing Pain Intervention(s): Repositioned;Limited activity within patient's tolerance    Home Living                      Prior Function            PT Goals (current goals can now be found in the care plan section) Acute Rehab PT Goals Patient Stated Goal: none stated PT Goal Formulation: Patient unable to participate in goal setting Time For Goal Achievement: 07/17/20 Potential to Achieve Goals: Fair Progress towards PT goals: Goals downgraded-see care plan    Frequency    Min 3X/week  PT Plan Discharge plan needs to be updated;Frequency needs to be updated    Co-evaluation              AM-PAC PT "6 Clicks" Mobility   Outcome Measure  Help needed turning from your back to your side while in a flat bed without using bedrails?: Total Help needed moving from lying on your back to sitting on the side of a flat bed without using bedrails?: Total Help needed moving to and from a bed to a chair (including a wheelchair)?: Total Help needed standing up from a chair using your arms (e.g., wheelchair or bedside chair)?: Total Help needed to walk in hospital room?: Total Help needed climbing 3-5 steps with a railing? : Total 6 Click Score: 6    End of Session   Activity Tolerance: Patient limited by lethargy Patient left: in bed;with call bell/phone within reach;with bed  alarm set Nurse Communication: Mobility status PT Visit Diagnosis: Hemiplegia and hemiparesis;Other abnormalities of gait and mobility (R26.89);Muscle weakness (generalized) (M62.81) Hemiplegia - Right/Left: Left Hemiplegia - dominant/non-dominant: Non-dominant Hemiplegia - caused by: Nontraumatic intracerebral hemorrhage     Time: 1217-1230 PT Time Calculation (min) (ACUTE ONLY): 13 min  Charges:  $Therapeutic Activity: 8-22 mins                     [facility] PT Acute Rehabilitation Services Pager [phone] Office [phone]    Angelina Ok [facility] 07/03/2020, 1:08 PM

## 2020-07-03 NOTE — Progress Notes (Signed)
  Speech Language Pathology Treatment: Dysphagia  Patient Details Name: Calvin Yates MRN: [id number] DOB: Mar 09, 1935 Today's Date: 07/03/2020 Time: 1008-1030 SLP Time Calculation (min) (ACUTE ONLY): 22 min  Assessment / Plan / Recommendation Clinical Impression  Pt repositioned for participation in oral care.  He opened his eyes to stimulation today and kept them open most of session. Persisting right gaze preference is notable. Extensive oral care provided, but it was difficult to access oral cavity and pt had difficulty following commands to open mouth/extend tongue for cleaning.  Thick secretions removed from anterior tongue and palate. Oral care did not elicit spontaneous swallowing.  Ice chips, 1/2 teaspoon water provided after oral care.  Most of bolus spilled from mouth - pt did not put forth effort to manipulate, nor could any swallow response be elicited despite maximal verbal/tactile cues.  Overall, pt with poor awareness of POs; there was no meaningful movement of oral mechanism and no swallow response could be evoked.    Recommend continued NPO with frequent oral care.  Decisions will need to be made with regard to long-term nutrition and whether that is aligned with pt's wishes.  SLP will follow - a palliative care consult is pending per RN.   HPI HPI: Calvin Yates is a 86 y.o. male with no significant history presented with slurred speech and L-sided weakness. MRI reported right frontoparietal parenchymal hemorrhage and left temporal hemorrhage. BSE 2/26 rec NPO. Intubated 3/3-3/7 due to respiratory distress, ST signed off and reordered 3/9. 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      SLP Plan  Continue with current plan of care       Recommendations  Diet recommendations: NPO                Oral Care Recommendations: Oral care QID SLP Visit Diagnosis: Dysphagia, oropharyngeal phase (R13.12) Plan: Continue with current plan of care       GO                 Blenda Mounts Laurice 07/03/2020, 10:35 AM  Marchelle Folks L. Samson Frederic, MA CCC/SLP Acute Rehabilitation Services Office number [phone] Pager [phone]

## 2020-07-03 NOTE — Progress Notes (Signed)
[facility] Progress Note  Patient: Calvin Yates    ZPH:[id number]  DOA: 06/15/2020     Date of Service: the patient was seen and examined on 07/03/2020  Brief hospital course: No significant past medical history.  Presents with sudden onset of unresponsiveness. LKW 2/25 1800 at dinner with wife, found down 1815 with SBP > 200, found to have 7cm R frontoparietal ICH without midline shift, L-sided deficits. PCCM consulted for worsening encephalopathy/concern for respiratory failure. 2/25 - Admit 3/03 - Intubated 3/07 - Extubated to 4LNC 3/08 - ?Seizure activity, STAT EEG per Neuro (unchanged from prior), LTM 3/09 - Persistent fever with new worsening leukocytosis, blood cultures repeated and vancomycin initiated  3/11 - Blood cultures positive for staph epidermidis with methicillin resistance in aerobic bottle only 3/14 - ID consulted for persistent fever. 3/15 family meeting at bedside.  Patient currently transition to complete comfort.  Anticipating in-hospital death.  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  Repeat CT scan shows mild edema. Neurological condition remained stable but no improvement since admission hence per neurology overall prognosis remains guarded. CT head shows innumerable foci of hemosiderin deposition scattered throughout the brain.  Differential includes amyloid angiopathy or hypertensive vascular disease. No antiplatelet or anticoagulation medication LDL 103, not on any statin secondary to ICH.Marland Kitchen PT OT recommends CIR although patient remains not a good CI candidate right now. Speech therapy recommends n.p.o. status. Now comfort care.  Acute hypoxic respiratory failure requiring MV Aspiration  pneumonia Persistent fever. Per report, vomited en route to hospital. Respiratory culture with normal respiratory flora.  Required urgent intubation overnight 3/4. Mental status improved and patient was successfully extubated 3/7. Head of bed elevated 30 degrees. Ensure adequate pulmonary hygiene  ID consult appreciated. Initial plan was continue with IV antibiotics vancomycin and Unasyn through 3/17. Now comfort care.  Staph epidermidis bacteremia. Concern for MRSA bacteremia-ruled out Patient developed persistent fever and worsening  leukocytosis 3/9, this prompted obtaining repeat blood cultures and broadening IV antibiotics to include vancomycin  Repeat blood cultures resulted positive for staph epidermidis with methicillin resistance in aerobic bottle only on 3/11  Fever curve remains flat Leukocytosis still present Repeat blood cultures on 3/12.  Febrile on 3/13. Also echocardiogram Doppler negative for vegetation or infection. PICC line removed 3/11 Initial plan was continue antibiotics through 3/17. Now comfort care.  Hypertensive emergency associated with intracranial hemorrhage> now controlled SBP on EMS arrival reportedly > 200. Echo 2/27 LVEF 70 to 75% and grade 1 diastolic dysfunction. Initially treated with Cleviprex infusion. Patient was on PO amlodipine, coreg, and lisinopril  Now comfort care.  Hypergylcemia  Controlled, has not been requiring insulin Now comfort care.  Acute anemia, likely due to acute illness Hemoglobin remained stable although the lower side. Now comfort care.  Physical deconditioning Now comfort care.  Unable to progress to rehab level.  Possible focal R seizure activity Questionable focal RLE "twitching" noted 3/8PM. Neuro with recommendation for STAT EEG which was grossly unremarkable. EEG negative for seizure activity 3/9  Advance goals of care discussion: DNR/DNI.  Family meeting on 3/15 at bedside.  All questions answered.   Currently transition to comfort care.  Difficulty with core track. RN is having difficulty removing the core track.  Not sure if there is any tissue embedment.  Core track team will be consulted when available.  Currently living the core track in, continue comfort.  Concern for C-spine injury-ruled out Hypophosphatemia-corrected  Body mass index is 27.89 kg/m.  Nutrition Problem: Inadequate oral intake Etiology: inability to eat Interventions: Interventions: Tube feeding,Prostat,MVI  Diet: Comfort care.  NPO. DVT Prophylaxis: Comfort care.   Family Communication: no family was present at bedside, at the time of interview.  Family meeting today on bedside.  Disposition:  Status is: Inpatient  Remains inpatient appropriate because:Inpatient level of care appropriate due to severity of illness  Dispo: The patient is from: Home              Anticipated d/c is to: to be determined, anticipating hospital death.              Patient currently is not medically stable to d/c.   Difficult to place patient No  Subjective: Remains minimally responsive.  Continues to have shortness of breath.  No nausea or vomiting.  Has cough as well.  Unable to follow commands.  Physical Exam:  General: Appear in moderate distress; no visible Abnormal Neck Mass Or lumps, Conjunctiva normal Cardiovascular: S1 and S2 Present, aortic systolic  Murmur, Respiratory: increased respiratory effort, Bilateral Air entry present and bilateral  Crackles, no wheezes Abdomen: Bowel Sound present Extremities: no Pedal edema Neurology: lethargic and not oriented to time, place, and person Gait not checked due to patient safety concerns  Vitals:   07/03/20 0342 07/03/20 0600 07/03/20 0752 07/03/20 1206  BP: (!) 131/47  (!) 140/42 (!) 147/61  Pulse: 82  82 85  Resp: 20  20 (!) 22  Temp: 98.4 F (36.9 C)  98.5 F (36.9 C) 98 F (36.7 C)  TempSrc: Oral  Oral   SpO2: 94%  97% 97%  Weight:  83.2 kg    Height:         Intake/Output Summary (Last 24 hours) at 07/03/2020 1945 Last data filed at 07/03/2020 1022 Gross per 24 hour  Intake 419.01 ml  Output 1575 ml  Net -1155.99 ml   Filed Weights   07/01/20 0500 07/02/20 0631 07/03/20 0600  Weight: 81.7 kg 81.3 kg 83.2 kg    Data Reviewed: I have personally reviewed and interpreted daily labs, tele strips, imaging. I reviewed all nursing notes, pharmacy notes, vitals, pertinent old records I have discussed plan of care as described above with RN and patient/family.  CBC: Recent Labs  Lab 06/29/20 0108 06/30/20 1207 07/01/20 0412 07/02/20 0232 07/03/20 0131  WBC 18.5* 20.9* 18.2* 18.4* 17.2*  NEUTROABS  --  17.5*  --   --   --   HGB 10.3* 10.8* 10.7* 10.5* 10.5*  HCT 30.8* 32.9* 31.2* 31.0* 31.9*  MCV 97.5 98.5 96.9 97.2 97.3  PLT 232 263 288 298 302   Basic Metabolic Panel: Recent Labs  Lab 06/27/20 0318 06/28/20 0825 06/29/20 0108 06/30/20 1207 07/01/20 0412 07/02/20 0232 07/03/20 0131  NA 146*   < > 142 139 137 137 141  K 4.0   < > 4.0 4.3 4.1 4.1 4.5  CL 115*   < > 114* 112* 111 112* 112*  CO2 22   < > 21* 19* 22 22 24   GLUCOSE 134*   < > 146* 163* 145* 154* 141*  BUN 29*   < > 32* 28* 28* 30* 30*  CREATININE 0.98   < > 0.96 0.74 0.86 0.88 0.88  CALCIUM 8.1*   < > 7.6* 7.7* 7.6* 7.6*  7.8*  MG 2.4  --   --  2.3 2.5* 2.5* 2.4  PHOS 3.4  --   --  2.8  --   --   --    < > = values in this interval not displayed.    Studies: No results found.  Scheduled Meds: . glycopyrrolate  0.4 mg Intravenous TID  . mouth rinse  15 mL Mouth Rinse q12n4p  . sodium chloride flush  10-40 mL Intracatheter Q12H   Continuous Infusions: . sodium chloride 10 mL/hr at 06/30/20 0400   PRN Meds: sodium chloride, [DISCONTINUED] acetaminophen **OR** [DISCONTINUED] acetaminophen (TYLENOL) oral liquid 160 mg/5 mL **OR** acetaminophen, antiseptic oral rinse, bisacodyl, glycopyrrolate **OR** glycopyrrolate **OR** glycopyrrolate, haloperidol **OR**  haloperidol **OR** haloperidol lactate, LORazepam, morphine injection, ondansetron (ZOFRAN) IV, polyvinyl alcohol, sodium chloride flush  Time spent: 35 minutes  Author: Lynden Oxford, MD [facility] 07/03/2020 7:45 PM  To reach On-call, see care teams to locate the attending and reach out via [email or web address]. Between 7PM-7AM, please contact night-coverage If you still have difficulty reaching the attending provider, please page the [facility] (Director on Call) for [facility] on amion for assistance.

## 2020-07-03 NOTE — Progress Notes (Signed)
Palliative:  HPI: 86 y.o. male  with no known past medical history who was admitted on 06/15/2020 with left sided weakness.  Imaging revealed a large ICH with a 7.6 cm hemorrhage.  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  He was intubated for several days and then successfully extubated, but his mental status has not cleared.  He has suffered with aspiration pneumonia and suspected bacteremia.  His mental status seems to wax and wane.      I met today with Mr. Calvin Yates's son Shanon Brow, daughter Wannetta Sender, and son-in-law. We discussed Mr. Sampley's devastating brain injury and how this has impacted his functional status and swallow function. We discussed the irreversible effects of this injury. They understand that he is at risk of aspiration from even his own secretions. They are very clear that he would not want his life prolonged in this state and they do not want him to suffer. With this goal in mind the decision was made to transition to full comfort care and d/c all other measures not adding to his comfort. We did discuss consideration of hospice facility placement if he is stable on comfort measures but he may very likely have a hospital death. Family understand and agree to proceed with comfort and re-evaluate in the coming days if hospice transfer is appropriate.   Of note, Mr. Willems has been the main caregiver for his wife who has dementia. Discussed with family who share her dementia is progressed where she would not be able to understand or remember anything going on. I also offered chaplain support and they tell me that Mr. Demby's pastor has been visiting and they will notify him of the change in status and request continued visits. Educated on access to chaplain support as needed for Mr. Faughn or family members.   All questions/concerns addressed. Emotional support provided. Updated RN Kasandra Knudsen, Dr. Posey Pronto, Dr. Leonie Man.   Exam:  Alert, attempts made to speak but unable to understand. Breathing labored and tachypneic. Abd distended and tight. Generalized weakness and fatigue.   Plan: - Full comfort care. Orders changed to reflect comfort.   [place], NP Palliative Medicine Team Pager [phone] (Please see [email or web address] for schedule) Team Phone [phone]    Greater than 50%  of this time was spent counseling and coordinating care related to the above assessment and plan

## 2020-07-03 NOTE — Progress Notes (Signed)
STROKE TEAM PROGRESS NOTE   STROKE TEAM PROGRESS NOTE   INTERVAL HISTORY Patient is less sleepy today and easier to arouse today   he does open eyes to stimulation and will  follow some simple commands   he is afebrile.  White count is still elevated though slightly lower than from yesterday.  metabolic panel labs are normal.  Blood cultures negative so far  Vitals:   07/03/20 0342 07/03/20 0600 07/03/20 0752 07/03/20 1206  BP: (!) 131/47  (!) 140/42 (!) 147/61  Pulse: 82  82 85  Resp: 20  20 (!) 22  Temp: 98.4 F (36.9 C)  98.5 F (36.9 C) 98 F (36.7 C)  TempSrc: Oral  Oral   SpO2: 94%  97% 97%  Weight:  83.2 kg    Height:       CBC:  Recent Labs  Lab 06/30/20 1207 07/01/20 0412 07/02/20 0232 07/03/20 0131  WBC 20.9*   < > 18.4* 17.2*  NEUTROABS 17.5*  --   --   --   HGB 10.8*   < > 10.5* 10.5*  HCT 32.9*   < > 31.0* 31.9*  MCV 98.5   < > 97.2 97.3  PLT 263   < > 298 302   < > = values in this interval not displayed.   Basic Metabolic Panel:  Recent Labs  Lab 06/27/20 0318 06/28/20 0825 06/30/20 1207 07/01/20 0412 07/02/20 0232 07/03/20 0131  NA 146*   < > 139   < > 137 141  K 4.0   < > 4.3   < > 4.1 4.5  CL 115*   < > 112*   < > 112* 112*  CO2 22   < > 19*   < > 22 24  GLUCOSE 134*   < > 163*   < > 154* 141*  BUN 29*   < > 28*   < > 30* 30*  CREATININE 0.98   < > 0.74   < > 0.88 0.88  CALCIUM 8.1*   < > 7.7*   < > 7.6* 7.8*  MG 2.4  --  2.3   < > 2.5* 2.4  PHOS 3.4  --  2.8  --   --   --    < > = values in this interval not displayed.   Lipid Panel:  No results for input(s): CHOL, TRIG, HDL, CHOLHDL, VLDL, LDLCALC in the last 168 hours. HgbA1c:  No results for input(s): HGBA1C in the last 168 hours. Urine Drug Screen:  No results for input(s): LABOPIA, COCAINSCRNUR, LABBENZ, AMPHETMU, THCU, LABBARB in the last 168 hours.  Alcohol Level No results for input(s): ETH in the last 168 hours.  IMAGING past 24 hours ECHOCARDIOGRAM COMPLETE  Result  Date: 06/17/2020 IMPRESSIONS   1. Left ventricular ejection fraction, by estimation, is 70 to 75%. The left ventricle has hyperdynamic function. The left ventricle has no regional wall motion abnormalities. Left ventricular diastolic parameters are consistent with Grade I diastolic dysfunction (impaired relaxation).   2. Right ventricular systolic function is normal. The right ventricular size is normal. Tricuspid regurgitation signal is inadequate for assessing PA pressure.   3. The mitral valve is normal in structure. Trivial mitral valve regurgitation. No evidence of mitral stenosis.   4. The aortic valve is tricuspid. Aortic valve regurgitation is not visualized. Mild aortic valve sclerosis is present, with no evidence of aortic valve stenosis.   5. The inferior vena cava is normal in size with  greater than 50% respiratory variability, suggesting right atrial pressure of 3 mmHg.    IMAGING past 24 hours MR BRAIN W WO CONTRAST  Result Date: 06/15/2020 CLINICAL DATA:   IMPRESSION: 1. Innumerable foci of hemosiderin deposition scattered throughout the brain consistent with previous hemorrhagic infarctions. Intraparenchymal hematoma at the right frontoparietal junction with internal clot retraction, measuring 7.8 x 4.7 x 4.6 cm. No abnormal enhancement to suggest that this represents a hemorrhagic mass. Small hemorrhage in the left temporal lobe cannot be specifically identified by MRI as different than the other foci of hemosiderin deposition. 2. Small amount of subarachnoid hemorrhage within the sulci. 3. Mild mass-effect upon the right lateral ventricle with right-to-left shift 3 mm. Chronic small-vessel ischemic changes of the white matter elsewhere. The overall pattern could either be due to amyloid angiopathy or hypertensive vascular disease. 4. No finding to suggest the presence metastatic disease. Electronically Signed   By: Paulina Fusi M.D.   On: 06/15/2020 22:56   DG CHEST PORT 1  VIEW  Result Date: 06/15/2020 CLINICAL DATA:  Unresponsive. EXAM: PORTABLE CHEST 1 VIEW COMPARISON:  None. FINDINGS: The heart size and mediastinal contours are within normal limits. Both lungs are clear. The visualized skeletal structures are unremarkable. IMPRESSION: No active disease. Electronically Signed   By: Lupita Raider M.D.   On: 06/15/2020 20:29   CT HEAD CODE STROKE WO CONTRAST  Result Date: 06/15/2020 IMPRESSION:  1. Right frontoparietal parenchymal hemorrhage measuring 7.6 cm. Sub-5 mm left temporal hemorrhage.  2. No significant midline shift or ventricular entrapment.  3. Right greater than left cerebral convexity subarachnoid blood products.  4. ASPECTS is 10.    Current Facility-Administered Medications:  .  0.9 %  sodium chloride infusion, , Intravenous, PRN, Micki Riley, MD, Last Rate: 10 mL/hr at 06/30/20 0400, Infusion Verify at 06/30/20 0400 .  acetaminophen (TYLENOL) tablet 650 mg, 650 mg, Oral, Q4H PRN, 650 mg at 06/24/20 1628 **OR** acetaminophen (TYLENOL) 160 MG/5ML solution 650 mg, 650 mg, Per Tube, Q4H PRN, 650 mg at 07/02/20 0523 **OR** acetaminophen (TYLENOL) suppository 650 mg, 650 mg, Rectal, Q4H PRN, Bhagat, Srishti L, MD, 650 mg at 06/17/20 0023 .  amLODipine (NORVASC) tablet 10 mg, 10 mg, Per Tube, Daily, Charlott Holler, MD, 10 mg at 07/03/20 0956 .  Ampicillin-Sulbactam (UNASYN) 3 g in sodium chloride 0.9 % 100 mL IVPB, 3 g, Intravenous, Q6H, Veryl Speak, FNP, Last Rate: 200 mL/hr at 07/03/20 0954, 3 g at 07/03/20 0954 .  bisacodyl (DULCOLAX) suppository 10 mg, 10 mg, Rectal, Daily PRN, Olivencia-Simmons, Ivelisse, NP .  carvedilol (COREG) tablet 25 mg, 25 mg, Per Tube, BID, Charlott Holler, MD, 25 mg at 07/03/20 0956 .  chlorhexidine (PERIDEX) 0.12 % solution 15 mL, 15 mL, Mouth/Throat, QID, Rolly Salter, MD, 15 mL at 07/03/20 0955 .  Chlorhexidine Gluconate Cloth 2 % PADS 6 each, 6 each, Topical, Daily, Bhagat, Srishti L, MD, 6 each at  07/03/20 0957 .  enoxaparin (LOVENOX) injection 40 mg, 40 mg, Subcutaneous, Daily, Micki Riley, MD, 40 mg at 07/03/20 0957 .  feeding supplement (OSMOLITE 1.5 CAL) liquid 1,000 mL, 1,000 mL, Per Tube, Continuous, Micki Riley, MD, Last Rate: 55 mL/hr at 07/03/20 0553, 1,000 mL at 07/03/20 0553 .  feeding supplement (PROSource TF) liquid 45 mL, 45 mL, Per Tube, BID, Micki Riley, MD, 45 mL at 07/03/20 0957 .  free water 200 mL, 200 mL, Per Tube, Q6H, [name], PA-C, 200  mL at 07/03/20 0553 .  guaiFENesin (ROBITUSSIN) 100 MG/5ML solution 100 mg, 5 mL, Per Tube, Q12H, Bevelyn Ngo, NP, 100 mg at 07/03/20 0956 .  hydrALAZINE (APRESOLINE) injection 20 mg, 20 mg, Intravenous, Q6H PRN, Olivencia-Simmons, Ivelisse, NP, 20 mg at 06/21/20 2233 .  labetalol (NORMODYNE) injection 5-20 mg, 5-20 mg, Intravenous, Q2H PRN, Milon Dikes, MD, 5 mg at 06/26/20 0540 .  lisinopril (ZESTRIL) tablet 5 mg, 5 mg, Per Tube, Daily, Milon Dikes, MD, 5 mg at 07/03/20 0958 .  MEDLINE mouth rinse, 15 mL, Mouth Rinse, q12n4p, Milon Dikes, MD, 15 mL at 07/02/20 1500 .  multivitamin with minerals tablet 1 tablet, 1 tablet, Per Tube, Daily, Micki Riley, MD, 1 tablet at 07/03/20 0956 .  ondansetron (ZOFRAN) injection 4 mg, 4 mg, Intravenous, Q6H PRN, Bhagat, Srishti L, MD, 4 mg at 06/16/20 1014 .  pantoprazole sodium (PROTONIX) 40 mg/20 mL oral suspension 40 mg, 40 mg, Per Tube, QHS, Micki Riley, MD, 40 mg at 07/02/20 2203 .  sodium chloride flush (NS) 0.9 % injection 10-40 mL, 10-40 mL, Intracatheter, Q12H, Micki Riley, MD, 10 mL at 07/02/20 2203 .  sodium chloride flush (NS) 0.9 % injection 10-40 mL, 10-40 mL, Intracatheter, PRN, Micki Riley, MD .  vancomycin (VANCOREADY) IVPB 1500 mg/300 mL, 1,500 mg, Intravenous, Q24H, Veryl Speak, FNP, Last Rate: 150 mL/hr at 07/02/20 1155, 1,500 mg at 07/02/20 1155   PHYSICAL EXAM  Temp:  [98 F (36.7 C)-98.7 F (37.1 C)] 98 F (36.7 C)  (03/15 1206) Pulse Rate:  [82-88] 85 (03/15 1206) Resp:  [20-22] 22 (03/15 1206) BP: (123-147)/(42-61) 147/61 (03/15 1206) SpO2:  [94 %-97 %] 97 % (03/15 1206) Weight:  [83.2 kg] 83.2 kg (03/15 0600) General: Well-developed well-nourished, resting in bed with eyes closed in NAD.  HEENT: Normocephalic, atraumatic, NG tube in place CVS: Regular rate rhythm Respiratory: Respiration even and unlabored\ GI: Abd soft, ND, NTTP  Neurological Exam : Patient is sleepy.  Keeps eyes closed unless stimulated. Opens eyes briefly to verbal stimulation. Speech remains dysarthric with soft voice.  Difficult to understand Oriented x4. Names 2/2 objects.  Has poor attention concentration and neglects the left hemibody.  Cranial nerves: Pupils are equal round reactive to light, blinks to threat from right but not from the left, does not cross midline to the left-has right gaze preference, left lower facial weakness also seen.  Motor exam: Right upper and lower extremity are 4+/5.  Left upper extremity is nearly flaccid    Left lower extremity with a little bit withdrawal   Sensory: Grimaces to noxious stimulation all over, without any significant difference noted between the grimace to noxious stimulation on the right versus left. Coordination: Difficult to assess given his mentation  ASSESSMENT/PLAN  86 year old male with very minimal past medical history. Ate dinner with his wife at 1730 and having a conversationtill 1800 hrs. Abruptly at 1815 was found to be down. EMS on arrival found his blood pressure to be high systolic 200s with slurred speech and left sided weakness. He had an episode of emesis on route and was transiently hypoxemic.   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  -CTA head & neck stable ICH, Mild to moderate right M2,  right ICA terminus, distal basilar artery and bilateral PCA narrowing. -MRI stable hematoma, no underlying hemorrhagic mets. Likely CAA vs hypertensive changes. Chronic hemosiderin deposits from prior ICHs . -2D Echo: EF 70-75%, Grade I diastolic dysfunction -LDL 103 -HgbA1c 5.6 -VTE prophylaxis - Lovenox  daily -No AC/AP prior to admission, now no AC/AP due to ICH and likely CAA -Therapy recommendations:  CIR -Disposition:  pending   Cerebral Edema -Was on HTS. Off 3% saline now -Na now 146 -Allow Na gradually trending down with continuing FW flushes  Concern for seizure -LTM negative for seizures.  Do not see a need for antiepileptics.  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 The overall pattern could either be due to amyloid angiopathy or hypertensive vascular disease. -No antiplatelets or anticoagulation. -Long term BP goal < 140   Hypertension -Home meds: none, no hx of HTN but with BP 200s on EMS arrival -Currently on Norvasc  daily, coreg  bid - added Lisinopril  daily 3/10. -off cleviprex infusion -Labetalol  q2hr prn, hydralazine  q6hr prn -Long-term BP goal < 140 due to CAA.   Hyperlipidemia -Home meds:  None  -LDL 103, goal < 70 -No statin at this time due to ICH and concern of CAA  Dysphagia -Secondary to stroke -SLP following -Cortrak in place -On Tube feeds and FW  Aspiration Pneumonia -Vomited in route w/EMS -Febrile: Tmax 101.2->afebrile  -Leukocytosis: WBC 19.7 -> 21.2 ->12.2->11.4->13.8-->15.1-->18.1-->19.3->18.5 ->pending -CXR bibasilar opacities -Completed 5 days of ceftriaxone   -Currently on vancomycin since 3/9.  Appreciate CCM           and pharmacy assistance. -Sputum culture (2/28): Negative -Aggressive pulmonary toilet on floor (IS q1 hour while           awake, flutter valve, pulm toilet q 4hrs)  Concern for MRSA bacteremia -Patient developed persistent fever and worsening                 leukocytosis 3/9, this prompted obtaining repeat blood          cultures and broadening IV antibiotics to include                    vancomycin  -Repeat blood cultures resulted positive for staph      epidermidis with methicillin resistance in aerobic            bottle  -S/p 5-day course of ceftriaxone with continued                      leukocytosis/fever -T-max 100.6 overnight, flat fever curve  -Leukocytosis:             15.1->18.4->19.3->19->18.5->pending -On Zosyn/Vanc -Repeat blood cultures are pending -PICC line removed 3/10 -Appreciate CCM management   Acute hypoxic respiratory failure -Likely related to aspiration, urgently intubated 3/4,       extubated 3/7-resolved.  -CCM had a conversation with family regarding re-       intubation-they need some time to think about goals of care.  We will continue full scope of care for now.  -Appreciate CCM assistance  Left arm and bilateral lower extremity swelling -Check Dopplers bilateral lower extremity and left upper extremity-reviewed-negative for DVT.  Other Stroke Risk Factors -Advanced Age >/= 82    Hospital day # 18   Patient`s  neurological condition remains stable but has not improved significantly since admission hence overall prognosis remains guarded.  Continue antibiotics and follow blood cultures.  Await family meeting this afternoon to decide on goals of care   Discussed with Dr. Allena KatzPatel.  No family at the bedside.  Greater than 50% time during this 15-minute visit was spent on counseling and coordination of care team.  Delia HeadyPramod Josel Keo, MD   Delia HeadyPramod Rye Dorado, MD Medical Director [facility] Pager: [phone] 07/03/2020 1:19 PM    To contact Stroke Continuity provider, please refer to [email or web address]. After hours, contact General Neurology

## 2020-07-03 NOTE — Plan of Care (Signed)
  Problem: Education: Goal: Knowledge of disease or condition will improve Outcome: Progressing Goal: Knowledge of secondary prevention will improve Outcome: Progressing Goal: Knowledge of patient specific risk factors addressed and post discharge goals established will improve Outcome: Progressing Goal: Individualized Educational Video(s) Outcome: Progressing   Problem: Coping: Goal: Will verbalize positive feelings about self Outcome: Progressing Goal: Will identify appropriate support needs Outcome: Progressing   Problem: Health Behavior/Discharge Planning: Goal: Ability to manage health-related needs will improve Outcome: Progressing   Problem: Self-Care: Goal: Ability to participate in self-care as condition permits will improve Outcome: Progressing Goal: Verbalization of feelings and concerns over difficulty with self-care will improve Outcome: Progressing Goal: Ability to communicate needs accurately will improve Outcome: Progressing   Problem: Nutrition: Goal: Risk of aspiration will decrease Outcome: Progressing Goal: Dietary intake will improve Outcome: Progressing   Problem: Intracerebral Hemorrhage Tissue Perfusion: Goal: Complications of Intracerebral Hemorrhage will be minimized Outcome: Progressing   

## 2020-07-03 NOTE — Progress Notes (Signed)
Inpatient Rehabilitation Admissions Coordinator  Noted orders per palliative. We will sign off at this time.  Ottie Glazier, RN, MSN Rehab Admissions Coordinator [phone] 07/03/2020 5:36 PM

## 2020-07-04 DIAGNOSIS — T17908D Unspecified foreign body in respiratory tract, part unspecified causing other injury, subsequent encounter: Secondary | ICD-10-CM | POA: Diagnosis not present

## 2020-07-04 MED ORDER — WHITE PETROLATUM EX OINT
TOPICAL_OINTMENT | CUTANEOUS | Status: AC
  Administered 2020-07-04: 0.2
  Filled 2020-07-04: qty 28.35

## 2020-07-04 NOTE — Progress Notes (Signed)
Palliative:  HPI: 86 y.o.malewith no known past medical historywho was admitted on 2/25/2022with left sided weakness. Imaging revealed a large ICH with a 7.6 cm hemorrhage. 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 He was intubated for several days and then successfully extubated, but his mental status has not cleared. He has suffered with aspiration pneumonia and suspected bacteremia. His mental status seems to wax and wane.   I met today at Calvin Yates's bedside along with his sister and niece. He is resting comfortably in bed and is alert and verbal although difficult to understand. Responses seem appropriate. Spent time offering support to family at bedside. I notified Cortrak team of resistance to removal and they will assess.   I called and spoke with daughter, Calvin Yates. Calvin Yates was visiting earlier today. We discussed how Mr. Calvin Yates appears very comfortably and in good spirits. I am glad he is able to have meaningful interactions with family at this time. I discussed with Calvin Yates that I do feel that he would be eligible for transition to hospice facility. Calvin Yates agrees this is appropriate and requests [employer] as this is even closer to family to get to him. We did discuss that if he were to decline further we can always reassess transition.   All questions/concerns addressed. Emotional support provided.   Exam: Alert, appropriate verbal responses when understood. No distress. Breathing regular, unlabored. Abd soft. Legs appear restless at times.   Plan: - Full comfort care. - Hopeful for transfer to [facility].   25 min  Vinie Sill, NP Palliative Medicine Team Pager [phone] (Please see [email or web address] for schedule) Team Phone [phone]    Greater than 50%  of this time was spent counseling and coordinating care related to the above assessment and plan

## 2020-07-04 NOTE — Progress Notes (Signed)
STROKE TEAM PROGRESS NOTE   STROKE TEAM PROGRESS NOTE   INTERVAL HISTORY Patient has now been made full comfort care.  Wife and daughter at the bedside.  Is comfortable.  Vitals:   07/03/20 0752 07/03/20 1206 07/03/20 2050 07/04/20 0735  BP: (!) 140/42 (!) 147/61 (!) 113/51 (!) 137/53  Pulse: 82 85 85 81  Resp: 20 (!) 22 20 18   Temp: 98.5 F (36.9 C) 98 F (36.7 C) 98.6 F (37 C) 98.5 F (36.9 C)  TempSrc: Oral  Oral Oral  SpO2: 97% 97% 93% 95%  Weight:      Height:       CBC:  Recent Labs  Lab 06/30/20 1207 07/01/20 0412 07/02/20 0232 07/03/20 0131  WBC 20.9*   < > 18.4* 17.2*  NEUTROABS 17.5*  --   --   --   HGB 10.8*   < > 10.5* 10.5*  HCT 32.9*   < > 31.0* 31.9*  MCV 98.5   < > 97.2 97.3  PLT 263   < > 298 302   < > = values in this interval not displayed.   Basic Metabolic Panel:  Recent Labs  Lab 06/30/20 1207 07/01/20 0412 07/02/20 0232 07/03/20 0131  NA 139   < > 137 141  K 4.3   < > 4.1 4.5  CL 112*   < > 112* 112*  CO2 19*   < > 22 24  GLUCOSE 163*   < > 154* 141*  BUN 28*   < > 30* 30*  CREATININE 0.74   < > 0.88 0.88  CALCIUM 7.7*   < > 7.6* 7.8*  MG 2.3   < > 2.5* 2.4  PHOS 2.8  --   --   --    < > = values in this interval not displayed.   Lipid Panel:  No results for input(s): CHOL, TRIG, HDL, CHOLHDL, VLDL, LDLCALC in the last 168 hours. HgbA1c:  No results for input(s): HGBA1C in the last 168 hours. Urine Drug Screen:  No results for input(s): LABOPIA, COCAINSCRNUR, LABBENZ, AMPHETMU, THCU, LABBARB in the last 168 hours.  Alcohol Level No results for input(s): ETH in the last 168 hours.  IMAGING past 24 hours ECHOCARDIOGRAM COMPLETE  Result Date: 06/17/2020 IMPRESSIONS   1. Left ventricular ejection fraction, by estimation, is 70 to 75%. The left ventricle has hyperdynamic function. The left ventricle has no regional wall motion abnormalities. Left ventricular diastolic parameters are consistent with Grade I diastolic dysfunction  (impaired relaxation).   2. Right ventricular systolic function is normal. The right ventricular size is normal. Tricuspid regurgitation signal is inadequate for assessing PA pressure.   3. The mitral valve is normal in structure. Trivial mitral valve regurgitation. No evidence of mitral stenosis.   4. The aortic valve is tricuspid. Aortic valve regurgitation is not visualized. Mild aortic valve sclerosis is present, with no evidence of aortic valve stenosis.   5. The inferior vena cava is normal in size with greater than 50% respiratory variability, suggesting right atrial pressure of 3 mmHg.    IMAGING past 24 hours MR BRAIN W WO CONTRAST  Result Date: 06/15/2020 CLINICAL DATA:   IMPRESSION: 1. Innumerable foci of hemosiderin deposition scattered throughout the brain consistent with previous hemorrhagic infarctions. Intraparenchymal hematoma at the right frontoparietal junction with internal clot retraction, measuring 7.8 x 4.7 x 4.6 cm. No abnormal enhancement to suggest that this represents a hemorrhagic mass. Small hemorrhage in the left temporal lobe cannot  be specifically identified by MRI as different than the other foci of hemosiderin deposition. 2. Small amount of subarachnoid hemorrhage within the sulci. 3. Mild mass-effect upon the right lateral ventricle with right-to-left shift 3 mm. Chronic small-vessel ischemic changes of the white matter elsewhere. The overall pattern could either be due to amyloid angiopathy or hypertensive vascular disease. 4. No finding to suggest the presence metastatic disease. Electronically Signed   By: Paulina Fusi M.D.   On: 06/15/2020 22:56   DG CHEST PORT 1 VIEW  Result Date: 06/15/2020 CLINICAL DATA:  Unresponsive. EXAM: PORTABLE CHEST 1 VIEW COMPARISON:  None. FINDINGS: The heart size and mediastinal contours are within normal limits. Both lungs are clear. The visualized skeletal structures are unremarkable. IMPRESSION: No active disease.  Electronically Signed   By: Lupita Raider M.D.   On: 06/15/2020 20:29   CT HEAD CODE STROKE WO CONTRAST  Result Date: 06/15/2020 IMPRESSION:  1. Right frontoparietal parenchymal hemorrhage measuring 7.6 cm. Sub-5 mm left temporal hemorrhage.  2. No significant midline shift or ventricular entrapment.  3. Right greater than left cerebral convexity subarachnoid blood products.  4. ASPECTS is 10.    Current Facility-Administered Medications:  .  0.9 %  sodium chloride infusion, , Intravenous, PRN, Micki Riley, MD, Last Rate: 10 mL/hr at 06/30/20 0400, Infusion Verify at 06/30/20 0400 .  [DISCONTINUED] acetaminophen (TYLENOL) tablet 650 mg, 650 mg, Oral, Q4H PRN, 650 mg at 06/24/20 1628 **OR** [DISCONTINUED] acetaminophen (TYLENOL) 160 MG/5ML solution 650 mg, 650 mg, Per Tube, Q4H PRN, 650 mg at 07/02/20 0523 **OR** acetaminophen (TYLENOL) suppository 650 mg, 650 mg, Rectal, Q4H PRN, Bhagat, Srishti L, MD, 650 mg at 06/17/20 0023 .  antiseptic oral rinse (BIOTENE) solution 15 mL, 15 mL, Topical, PRN, Ulice Bold, NP .  bisacodyl (DULCOLAX) suppository 10 mg, 10 mg, Rectal, Daily PRN, Olivencia-Simmons, Ivelisse, NP .  glycopyrrolate (ROBINUL) tablet 1 mg, 1 mg, Oral, Q4H PRN **OR** glycopyrrolate (ROBINUL) injection 0.2 mg, 0.2 mg, Subcutaneous, Q4H PRN **OR** glycopyrrolate (ROBINUL) injection 0.2 mg, 0.2 mg, Intravenous, Q4H PRN, Ulice Bold, NP .  glycopyrrolate (ROBINUL) injection 0.4 mg, 0.4 mg, Intravenous, TID, Ulice Bold, NP, 0.4 mg at 07/04/20 0844 .  haloperidol (HALDOL) tablet 2 mg, 2 mg, Oral, Q4H PRN **OR** haloperidol (HALDOL) 2 MG/ML solution 2 mg, 2 mg, Sublingual, Q4H PRN **OR** haloperidol lactate (HALDOL) injection 2 mg, 2 mg, Intravenous, Q4H PRN, Rolly Salter, MD .  LORazepam (ATIVAN) injection 1 mg, 1 mg, Intravenous, Q6H PRN, Yong Channel C, NP .  MEDLINE mouth rinse, 15 mL, Mouth Rinse, q12n4p, Milon Dikes, MD, 15 mL at 07/03/20 1254 .  morphine  2 MG/ML injection 1-4 mg, 1-4 mg, Intravenous, Q1H PRN, Rolly Salter, MD .  ondansetron [facility]) injection 4 mg, 4 mg, Intravenous, Q6H PRN, Bhagat, Srishti L, MD, 4 mg at 06/16/20 1014 .  polyvinyl alcohol (LIQUIFILM TEARS) 1.4 % ophthalmic solution 1 drop, 1 drop, Both Eyes, QID PRN, Yong Channel C, NP .  sodium chloride flush (NS) 0.9 % injection 10-40 mL, 10-40 mL, Intracatheter, Q12H, Micki Riley, MD, 10 mL at 07/04/20 0845 .  sodium chloride flush (NS) 0.9 % injection 10-40 mL, 10-40 mL, Intracatheter, PRN, Micki Riley, MD   PHYSICAL EXAM  Temp:  [98.5 F (36.9 C)-98.6 F (37 C)] 98.5 F (36.9 C) (03/16 0735) Pulse Rate:  [81-85] 81 (03/16 0735) Resp:  [18-20] 18 (03/16 0735) BP: (113-137)/(51-53) 137/53 (03/16 0735) SpO2:  [93 %-  95 %] 95 % (03/16 0735) General: Well-developed well-nourished, resting in bed with eyes closed in NAD.  HEENT: Normocephalic, atraumatic, NG tube in place CVS: Regular rate rhythm Respiratory: Respiration even and unlabored\ GI: Abd soft, ND, NTTP  Neurological Exam : Patient is resting comfortably. Opens eyes briefly to verbal stimulation. Speech remains dysarthric with soft voice.  Difficult to understand  y.  Cranial nerves: Pupils are equal round reactive to light, blinks to threat from right but not from the left, does not cross midline to the left-has right gaze preference, left lower facial weakness also seen.  Motor exam: Right upper and lower extremity are 4+/5.  Left upper extremity is nearly flaccid    Left lower extremity with a little bit withdrawal   Sensory: Grimaces to noxious stimulation all over, without any significant difference noted between the grimace to noxious stimulation on the right versus left. Coordination: Difficult to assess given his mentation  ASSESSMENT/PLAN  86 year old male with very minimal past medical history. Ate dinner with his wife at 1730 and having a conversationtill 1800 hrs. Abruptly at  1815 was found to be down. EMS on arrival found his blood pressure to be high systolic 200s with slurred speech and left sided weakness. He had an episode of emesis on route and was transiently hypoxemic.   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  -CTA head & neck stable ICH, Mild to moderate right M2, right ICA terminus, distal basilar artery and bilateral PCA narrowing. -MRI stable hematoma, no underlying hemorrhagic mets. Likely CAA vs hypertensive changes. Chronic hemosiderin deposits from prior ICHs . -2D Echo: EF 70-75%, Grade I diastolic dysfunction -LDL 103 -HgbA1c 5.6 -VTE prophylaxis - Lovenox 40mg  daily -No AC/AP prior to admission, now no AC/AP due to ICH and likely CAA -Therapy recommendations:  CIR -Disposition:  pending   Cerebral Edema -Was on HTS. Off 3% saline now -Na now 146 -Allow Na gradually trending down with continuing FW flushes  Concern for seizure -LTM negative for seizures.  Do not see a need for antiepileptics.  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 The overall pattern could either be due to amyloid angiopathy or hypertensive vascular disease. -No antiplatelets or anticoagulation. -Long term BP goal < 140   Hypertension -Home meds: none, no hx of HTN but with BP 200s on EMS arrival -Currently on Norvasc 10mg  daily, coreg 25mg  bid - added Lisinopril 5mg  daily 3/10. -off cleviprex infusion -Labetalol 20mg  q2hr prn, hydralazine 20mg  q6hr  prn -Long-term BP goal < 140 due to CAA.   Hyperlipidemia -Home meds:  None  -LDL 103, goal < 70 -No statin at this time due to ICH and concern of CAA  Dysphagia -Secondary to stroke -SLP following -Cortrak in place -On Tube feeds and FW  Aspiration Pneumonia -Vomited in route w/EMS -Febrile: Tmax 101.2->afebrile  -Leukocytosis: WBC 19.7 -> 21.2 ->12.2->11.4->13.8-->15.1-->18.1-->19.3->18.5 ->pending -CXR bibasilar opacities -Completed 5 days of ceftriaxone  -Currently on vancomycin since 3/9.  Appreciate CCM           and pharmacy assistance. -Sputum culture (2/28): Negative -Aggressive pulmonary toilet  on floor (IS q1 hour while           awake, flutter valve, pulm toilet q 4hrs)  Concern for MRSA bacteremia -Patient developed persistent fever and worsening                 leukocytosis 3/9, this prompted obtaining repeat blood          cultures and broadening IV antibiotics to include                    vancomycin  -Repeat blood cultures resulted positive for staph      epidermidis with methicillin resistance in aerobic            bottle  -S/p 5-day course of ceftriaxone with continued                      leukocytosis/fever -T-max 100.6 overnight, flat fever curve  -Leukocytosis:             15.1->18.4->19.3->19->18.5->pending -On Zosyn/Vanc -Repeat blood cultures are pending -PICC line removed 3/10 -Appreciate CCM management   Acute hypoxic respiratory failure -Likely related to aspiration, urgently intubated 3/4,       extubated 3/7-resolved.  -CCM had a conversation with family regarding re-       intubation-they need some time to think about goals of care.  We will continue full scope of care for now.  -Appreciate CCM assistance  Left arm and bilateral lower extremity swelling -Check Dopplers bilateral lower extremity and left upper extremity-reviewed-negative for DVT.  Other Stroke Risk Factors -Advanced Age >/= 43    Hospital day # 19   Patient has been  made comfort care measures only.  Discussed with wife and daughter and answered questions.  Stroke team will sign off.  Kindly call for questions.  D/w Dr Elvera Lennox Delia Heady, MD   Delia Heady, MD Medical Director [facility] Pager: [phone] 07/04/2020 1:23 PM    To contact Stroke Continuity provider, please refer to [email or web address]. After hours, contact General Neurology

## 2020-07-04 NOTE — Plan of Care (Signed)
  Problem: Education: Goal: Knowledge of disease or condition will improve Outcome: Progressing Goal: Knowledge of secondary prevention will improve Outcome: Progressing Goal: Knowledge of patient specific risk factors addressed and post discharge goals established will improve Outcome: Progressing Goal: Individualized Educational Video(s) Outcome: Progressing   Problem: Coping: Goal: Will verbalize positive feelings about self Outcome: Progressing Goal: Will identify appropriate support needs Outcome: Progressing   Problem: Health Behavior/Discharge Planning: Goal: Ability to manage health-related needs will improve Outcome: Progressing   Problem: Self-Care: Goal: Ability to participate in self-care as condition permits will improve Outcome: Progressing Goal: Verbalization of feelings and concerns over difficulty with self-care will improve Outcome: Progressing Goal: Ability to communicate needs accurately will improve Outcome: Progressing   Problem: Nutrition: Goal: Risk of aspiration will decrease Outcome: Progressing Goal: Dietary intake will improve Outcome: Progressing   Problem: Intracerebral Hemorrhage Tissue Perfusion: Goal: Complications of Intracerebral Hemorrhage will be minimized Outcome: Progressing   Problem: Education: Goal: Knowledge of the prescribed therapeutic regimen will improve Outcome: Progressing   Problem: Coping: Goal: Ability to identify and develop effective coping behavior will improve Outcome: Progressing   Problem: Clinical Measurements: Goal: Quality of life will improve Outcome: Progressing   Problem: Respiratory: Goal: Verbalizations of increased ease of respirations will increase Outcome: Progressing   Problem: Role Relationship: Goal: Family's ability to cope with current situation will improve Outcome: Progressing Goal: Ability to verbalize concerns, feelings, and thoughts to partner or family member will improve Outcome:  Progressing   Problem: Pain Management: Goal: Satisfaction with pain management regimen will improve Outcome: Progressing

## 2020-07-04 NOTE — Progress Notes (Signed)
PROGRESS NOTE  Calvin Yates NLZ:[id number] DOB: 29-Apr-1934 DOA: 06/15/2020 PCP: Kaleen Mask, MD   LOS: 19 days   Brief Narrative / Interim history: No significant past medical history.  Presents with sudden onset of unresponsiveness. LKW 2/25 1800 at dinner with wife, found down 1815 with SBP > 200, found to have 7cm R frontoparietal ICH without midline shift, L-sided deficits. PCCM consulted for worsening encephalopathy/concern for respiratory failure. 2/25 - Admit 3/03 - Intubated 3/07 - Extubated to 4LNC 3/08 - ?Seizure activity, STAT EEG per Neuro (unchanged from prior), LTM 3/09 - Persistent fever with new worsening leukocytosis, blood cultures repeated and vancomycin initiated  3/11 - Blood cultures positive for staph epidermidis with methicillin resistance in aerobic bottle only 3/14 - ID consulted for persistent fever. 3/15 family meeting at bedside.  Patient currently transition to complete comfort.  Anticipating in-hospital death.  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  Repeat CT scan shows mild edema, neurological condition remained stable but no improvement since admission and per neurology overall prognosis remains very guarded. CT head shows innumerable foci of hemosiderin deposition scattered throughout the brain, with differential including amyloid angiopathy or hypertensive vascular disease. No antiplatelet or anticoagulation medication, LDL 103, not on any statin secondary to ICH.  Palliative care consulted and patient was transitioned to full comfort care  Active Problems Acute hypoxic respiratory failure requiring  MV Aspiration pneumonia Persistent fever -Per report, vomited en route to hospital. Respiratory culture with normal respiratory flora. Required urgent intubation overnight 3/4. Mental status improved and patient was successfully extubated 3/7. Initial plan was continue with IV antibiotics vancomycin and Unasyn through 3/17. Now comfort care.  Staph epidermidis bacteremia.  Concern for MRSA bacteremia-ruled out -Patient developed persistent fever and worsening leukocytosis 3/9, this prompted obtaining repeat blood cultures and broadening IV antibiotics to include vancomycin. Repeat blood cultures resulted positive for staph epidermidis with methicillin resistance in aerobic bottle only on 3/11. Also echocardiogram Doppler negative for vegetation or infection.Off antibiotics now  Hypertensive emergency associated with intracranial hemorrhage> now controlled -SBP on EMS arrival reportedly > 200. Echo 2/27 LVEF 70 to 75% and grade 1 diastolic dysfunction. Initially treated with Cleviprex infusion.  Hypergylcemia -Controlled, has not been requiring insulin  Acute anemia, likely due to acute illness Physical deconditioning Possible focal R seizure activity -Questionable focal RLE "twitching" noted 3/8PM. Neuro with recommendation for STAT EEG which was grossly unremarkable.EEG negative for seizure activity 3/9 Difficulty with core track -RN is having difficulty removing the core track.  Not sure if there is any tissue embedment.  Core track team will be consulted when available.  Currently leaving the core track in, continue comfort.  Concern for C-spine injury-ruled out Hypophosphatemia-corrected  Scheduled Meds:  glycopyrrolate  0.4 mg Intravenous TID   mouth rinse  15 mL Mouth Rinse q12n4p   sodium chloride flush  10-40 mL Intracatheter Q12H   Continuous Infusions:  sodium chloride 10 mL/hr at 06/30/20 0400   PRN Meds:.sodium chloride, [DISCONTINUED] acetaminophen **OR**  [DISCONTINUED] acetaminophen (TYLENOL) oral liquid 160 mg/5 mL **OR** acetaminophen, antiseptic oral rinse, bisacodyl, glycopyrrolate **OR** glycopyrrolate **OR** glycopyrrolate, haloperidol **OR** haloperidol **OR** haloperidol lactate, LORazepam, morphine injection, ondansetron (ZOFRAN) IV, polyvinyl alcohol, sodium chloride flush  Diet Orders (From admission,  onward)    Start     Ordered   06/15/20 2059  Diet NPO time specified  Diet effective now        06/15/20 2058          DVT prophylaxis:      Code Status: DNR  Family Communication: no family at bedside  Status is: Inpatient  Remains inpatient appropriate because:Inpatient level of care appropriate due to severity of illness   Dispo: The patient is from: Home              Anticipated d/c is to: in hospital death              Patient currently is not medically stable to d/c.   Difficult to place patient No  Level of care: Progressive  Consultants:  Palliative Neurology    Objective: Vitals:   07/03/20 0752 07/03/20 1206 07/03/20 2050 07/04/20 0735  BP: (!) 140/42 (!) 147/61 (!) 113/51 (!) 137/53  Pulse: 82 85 85 81  Resp: 20 (!) 22 20 18   Temp: 98.5 F (36.9 C) 98 F (36.7 C) 98.6 F (37 C) 98.5 F (36.9 C)  TempSrc: Oral  Oral Oral  SpO2: 97% 97% 93% 95%  Weight:      Height:        Intake/Output Summary (Last 24 hours) at 07/04/2020 0922 Last data filed at 07/04/2020 0504 Gross per 24 hour  Intake --  Output 775 ml  Net -775 ml   Filed Weights   07/01/20 0500 07/02/20 0631 07/03/20 0600  Weight: 81.7 kg 81.3 kg 83.2 kg    Examination:  Constitutional: NAD Respiratory: clear to auscultation bilaterally, no wheezing, no crackles. Cardiovascular: Regular rate and rhythm, no murmurs / rubs / gallops. No LE edema.    Data Reviewed: I have independently reviewed following labs and imaging studies   CBC: Recent Labs  Lab 06/29/20 0108 06/30/20 1207 07/01/20 0412 07/02/20 0232  07/03/20 0131  WBC 18.5* 20.9* 18.2* 18.4* 17.2*  NEUTROABS  --  17.5*  --   --   --   HGB 10.3* 10.8* 10.7* 10.5* 10.5*  HCT 30.8* 32.9* 31.2* 31.0* 31.9*  MCV 97.5 98.5 96.9 97.2 97.3  PLT 232 263 288 298 302   Basic Metabolic Panel: Recent Labs  Lab 06/29/20 0108 06/30/20 1207 07/01/20 0412 07/02/20 0232 07/03/20 0131  NA 142 139 137 137 141  K 4.0 4.3 4.1 4.1 4.5  CL 114* 112* 111 112* 112*  CO2 21* 19* 22 22 24   GLUCOSE 146* 163* 145* 154* 141*  BUN 32* 28* 28* 30* 30*  CREATININE 0.96 0.74 0.86 0.88 0.88  CALCIUM 7.6* 7.7* 7.6* 7.6* 7.8*  MG  --  2.3 2.5* 2.5* 2.4  PHOS  --  2.8  --   --   --    Liver Function Tests: Recent Labs  Lab 06/30/20 1207  AST 26  ALT 44  ALKPHOS 52  BILITOT 0.6  PROT 5.8*  ALBUMIN 2.4*   Coagulation Profile: No results for input(s): INR, PROTIME in the last 168 hours. HbA1C: No results for input(s): HGBA1C in the last 72 hours. CBG: Recent Labs  Lab 07/02/20 2017 07/02/20 2320 07/03/20 0335 07/03/20 0750 07/03/20 1205  GLUCAP 128* 123* 140* 143* 146*    Recent Results (from the past 240 hour(s))  Culture, blood (routine x 2)     Status: Abnormal   Collection Time: 06/27/20 10:52 AM   Specimen: BLOOD LEFT HAND  Result Value  Ref Range Status   Specimen Description BLOOD LEFT HAND  Final   Special Requests   Final    BOTTLES DRAWN AEROBIC ONLY Blood Culture adequate volume   Culture  Setup Time   Final    GRAM POSITIVE COCCI IN CLUSTERS AEROBIC BOTTLE ONLY CRITICAL RESULT CALLED TO, READ BACK BY AND VERIFIED WITH: Ihor Austin PharmD 12:20 06/28/20 ([email or web address]) Performed at [facility], [address], [place], [place] [address]    Culture STAPHYLOCOCCUS EPIDERMIDIS (A)  Final   Report Status 06/30/2020 FINAL  Final   Organism ID, Bacteria STAPHYLOCOCCUS EPIDERMIDIS  Final      Susceptibility   Staphylococcus epidermidis - MIC*    CIPROFLOXACIN >=8 RESISTANT Resistant     ERYTHROMYCIN >=8 RESISTANT Resistant      GENTAMICIN <=0.5 SENSITIVE Sensitive     OXACILLIN >=4 RESISTANT Resistant     TETRACYCLINE 4 SENSITIVE Sensitive     VANCOMYCIN <=0.5 SENSITIVE Sensitive     TRIMETH/SULFA 80 RESISTANT Resistant     CLINDAMYCIN >=8 RESISTANT Resistant     RIFAMPIN <=0.5 SENSITIVE Sensitive     Inducible Clindamycin NEGATIVE Sensitive     * STAPHYLOCOCCUS EPIDERMIDIS  Culture, blood (routine x 2)     Status: Abnormal   Collection Time: 06/27/20 10:52 AM   Specimen: BLOOD LEFT HAND  Result Value Ref Range Status   Specimen Description BLOOD LEFT HAND  Final   Special Requests   Final    BOTTLES DRAWN AEROBIC ONLY Blood Culture adequate volume   Culture  Setup Time   Final    GRAM POSITIVE COCCI AEROBIC BOTTLE ONLY CRITICAL VALUE NOTED.  VALUE IS CONSISTENT WITH PREVIOUSLY REPORTED AND CALLED VALUE.    Culture (A)  Final    STAPHYLOCOCCUS EPIDERMIDIS SUSCEPTIBILITIES PERFORMED ON PREVIOUS CULTURE WITHIN THE LAST 5 DAYS. Performed at [facility], [address] [place], [place] [address]    Report Status 06/30/2020 FINAL  Final  Blood Culture ID Panel (Reflexed)     Status: Abnormal   Collection Time: 06/27/20 10:52 AM  Result Value Ref Range Status   Enterococcus faecalis NOT DETECTED NOT DETECTED Final   Enterococcus Faecium NOT DETECTED NOT DETECTED Final   Listeria monocytogenes NOT DETECTED NOT DETECTED Final   Staphylococcus species DETECTED (A) NOT DETECTED Final    Comment: CRITICAL RESULT CALLED TO, READ BACK BY AND VERIFIED WITH: Ihor Austin PharmD 12:20 06/28/20 ([email or web address])    Staphylococcus aureus (BCID) NOT DETECTED NOT DETECTED Final   Staphylococcus epidermidis DETECTED (A) NOT DETECTED Final    Comment: Methicillin (oxacillin) resistant coagulase negative staphylococcus. Possible blood culture contaminant (unless isolated from more than one blood culture draw or clinical case suggests pathogenicity). No antibiotic treatment is indicated for blood  culture  contaminants. CRITICAL RESULT CALLED TO, READ BACK BY AND VERIFIED WITH: Ihor Austin PharmD 12:20 06/28/20 ([email or web address])    Staphylococcus lugdunensis NOT DETECTED NOT DETECTED Final   Streptococcus species NOT DETECTED NOT DETECTED Final   Streptococcus agalactiae NOT DETECTED NOT DETECTED Final   Streptococcus pneumoniae NOT DETECTED NOT DETECTED Final   Streptococcus pyogenes NOT DETECTED NOT DETECTED Final   A.calcoaceticus-baumannii NOT DETECTED NOT DETECTED Final   Bacteroides fragilis NOT DETECTED NOT DETECTED Final   Enterobacterales NOT DETECTED NOT DETECTED Final   Enterobacter cloacae complex NOT DETECTED NOT DETECTED Final   Escherichia coli NOT DETECTED NOT DETECTED Final   Klebsiella aerogenes NOT DETECTED NOT DETECTED Final   Klebsiella oxytoca NOT DETECTED NOT DETECTED  Final   Klebsiella pneumoniae NOT DETECTED NOT DETECTED Final   Proteus species NOT DETECTED NOT DETECTED Final   Salmonella species NOT DETECTED NOT DETECTED Final   Serratia marcescens NOT DETECTED NOT DETECTED Final   Haemophilus influenzae NOT DETECTED NOT DETECTED Final   Neisseria meningitidis NOT DETECTED NOT DETECTED Final   Pseudomonas aeruginosa NOT DETECTED NOT DETECTED Final   Stenotrophomonas maltophilia NOT DETECTED NOT DETECTED Final   Candida albicans NOT DETECTED NOT DETECTED Final   Candida auris NOT DETECTED NOT DETECTED Final   Candida glabrata NOT DETECTED NOT DETECTED Final   Candida krusei NOT DETECTED NOT DETECTED Final   Candida parapsilosis NOT DETECTED NOT DETECTED Final   Candida tropicalis NOT DETECTED NOT DETECTED Final   Cryptococcus neoformans/gattii NOT DETECTED NOT DETECTED Final   Methicillin resistance mecA/C DETECTED (A) NOT DETECTED Final    Comment: CRITICAL RESULT CALLED TO, READ BACK BY AND VERIFIED WITH: Ihor Austin PharmD 12:20 06/28/20 ([email or web address]) Performed at [facility], [address] [place], [place] [address]   Culture, blood (routine x 2)     Status: None  (Preliminary result)   Collection Time: 06/30/20 12:07 PM   Specimen: BLOOD  Result Value Ref Range Status   Specimen Description BLOOD SITE NOT SPECIFIED  Final   Special Requests   Final    BOTTLES DRAWN AEROBIC ONLY Blood Culture results may not be optimal due to an inadequate volume of blood received in culture bottles   Culture   Final    NO GROWTH 3 DAYS Performed at [facility], [address] [place], [place] [address]    Report Status PENDING  Incomplete  Culture, blood (routine x 2)     Status: None (Preliminary result)   Collection Time: 06/30/20 12:07 PM   Specimen: BLOOD  Result Value Ref Range Status   Specimen Description BLOOD SITE NOT SPECIFIED  Final   Special Requests   Final    BOTTLES DRAWN AEROBIC ONLY Blood Culture results may not be optimal due to an inadequate volume of blood received in culture bottles   Culture   Final    NO GROWTH 3 DAYS Performed at [facility], [address] [place], [place] [address]    Report Status PENDING  Incomplete     Radiology Studies: No results found.   Pamella Pert, MD, PhD [facility]  Between 7 am - 7 pm I am available, please contact me via Amion or Securechat  Between 7 pm - 7 am I am not available, please contact night coverage MD/APP via Amion

## 2020-07-04 NOTE — Progress Notes (Signed)
Nutrition Brief Note  Chart reviewed. Pt now transitioning to comfort care.  No further nutrition interventions warranted at this time.  Please re-consult as needed.   Dovid Bartko, MS, RD, LDN RD pager number and weekend/on-call pager number located in Amion.    

## 2020-07-04 NOTE — TOC Initial Note (Signed)
Transition of Care [facility]) - Initial/Assessment Note    Patient Details  Name: Calvin Yates MRN: [id number] Date of Birth: 1934-12-10  Transition of [facility]) CM/SW Contact:    Baldemar Lenis, LCSW Phone Number: 07/04/2020, 2:21 PM  Clinical Narrative:        CSW notified by palliative that patient is stable for transfer to residential hospice, with family preference for [facility]. CSW contacted [facility] and spoke with Admissions liaison to provide referral. [facility] will review and get back to CSW with hospice approval and bed availability. CSW to follow.           Expected Discharge Plan: Hospice Medical Facility Barriers to Discharge: Continued Medical Work up,Hospice Bed not available   Patient Goals and CMS Choice Patient states their goals for this hospitalization and ongoing recovery are:: patient unable to participate in goal setting, not fully oriented CMS Medicare.gov Compare Post Acute Care list provided to:: Patient Represenative (must comment) Choice offered to / list presented to : Adult Children  Expected Discharge Plan and Services Expected Discharge Plan: Hospice Medical Facility     Post Acute Care Choice: Hospice Living arrangements for the past 2 months: Single Family Home                                      Prior Living Arrangements/Services Living arrangements for the past 2 months: Single Family Home   Patient language and need for interpreter reviewed:: No Do you feel safe going back to the place where you live?: Yes      Need for Family Participation in Patient Care: Yes (Comment) Care giver support system in place?: No (comment)   Criminal Activity/Legal Involvement Pertinent to Current Situation/Hospitalization: No - Comment as needed  Activities of Daily Living Home Assistive Devices/Equipment: None ADL Screening (condition at time of admission) Patient's cognitive ability adequate to safely complete daily activities?:  No Is the patient deaf or have difficulty hearing?: No Does the patient have difficulty seeing, even when wearing glasses/contacts?: No Does the patient have difficulty concentrating, remembering, or making decisions?: Yes Patient able to express need for assistance with ADLs?: Yes Does the patient have difficulty dressing or bathing?: Yes Independently performs ADLs?: No Communication: Needs assistance Dressing (OT): Needs assistance Is this a change from baseline?: Pre-admission baseline Grooming: Dependent Is this a change from baseline?: Pre-admission baseline Feeding: Needs assistance Is this a change from baseline?: Pre-admission baseline Bathing: Dependent Is this a change from baseline?: Pre-admission baseline Toileting: Needs assistance Is this a change from baseline?: Pre-admission baseline In/Out Bed: Needs assistance Is this a change from baseline?: Pre-admission baseline Walks in Home: Needs assistance Does the patient have difficulty walking or climbing stairs?: Yes Weakness of Legs: Both Weakness of Arms/Hands: Left  Permission Sought/Granted Permission sought to share information with : Facility Contractor granted to share information with : Yes, Verbal Permission Granted  Share Information with NAME: Doristine Church  Permission granted to share info w AGENCY: [facility]  Permission granted to share info w Relationship: Family     Emotional Assessment   Attitude/Demeanor/Rapport: Unable to Assess Affect (typically observed): Unable to Assess Orientation: : Oriented to Place Alcohol / Substance Use: Not Applicable Psych Involvement: No (comment)  Admission diagnosis:  ICH (intracerebral hemorrhage) (HCC) [I61.9] Hypoxia [R09.02] Left-sided weakness [R53.1] Intraparenchymal hemorrhage of brain [facility]) [I61.9] Patient Active Problem List   Diagnosis  Date Noted  . Aspiration into airway   . Intubation of airway  performed without difficulty   . Respiratory abnormalities   . Fever   . Hypoxia   . ICH (intracerebral hemorrhage) (HCC) 06/15/2020   PCP:  Kaleen Mask, MD Pharmacy:   [facility] [address] [place] - [address] [place] [address] Phone: [phone] Fax: [phone]     Social Determinants of Health (SDOH) Interventions    Readmission Risk Interventions No flowsheet data found.

## 2020-07-05 LAB — CULTURE, BLOOD (ROUTINE X 2)
Culture: NO GROWTH
Culture: NO GROWTH

## 2020-07-05 MED ORDER — MORPHINE SULFATE (CONCENTRATE) 10 MG/0.5ML PO SOLN
5.0000 mg | ORAL | Status: DC | PRN
  Administered 2020-07-05: 5 mg via ORAL
  Filled 2020-07-05: qty 0.5

## 2020-07-05 NOTE — Discharge Summary (Signed)
Physician Discharge Summary  Albin FischerRobert Lee Furches RUE:[id number] DOB: 09/23/1934 DOA: 06/15/2020  PCP: Kaleen MaskElkins, Wilson Oliver, MD  Admit date: 06/15/2020 Discharge date: 07/05/2020  Admitted From: home Disposition:  Residential hospice  Home Health: none Equipment/Devices: none  Discharge Condition: stable CODE STATUS: DNR Diet recommendation: comfort feeding   HPI: Per admitting MD, Albin FischerRobert Lee Rasmus is a [age] y.o. male with past medical history significant for cataracts, not on any medications, in good health per family. He ate dinner with his wife at 5:30 PM and then was sitting and eating in newspaper.  They were talking back-and-forth although he was in another room.  He stopped answering her and she found him down at about 6:15 PM, estimating that he was last talking to her at about 6 PM.  EMS was activated and noted the patient's blood pressures were in the 200s over 100s, blood glucose was 122, tachycardic, and he had an episode of emesis on route with some hypoxia to the high 80s for which he was placed on nonrebreather with improvement of his saturations. 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  Repeat CT scan shows mild edema, neurological condition remained stable but no improvement since admission and per neurology overall prognosis remains very guarded. CT head shows innumerable foci of hemosiderin deposition scattered throughout the brain, with differential including amyloid angiopathy or hypertensive vascular disease. No antiplatelet or anticoagulation medication, LDL 103, not on any statin secondary to ICH.  Palliative care consulted and patient was transitioned to full  comfort care  Active Problems Acute hypoxic respiratory failure requiring MV Sepsis due to Aspiration pneumonia Persistent fever -Per report, vomited en route to hospital. Respiratory culture with normal respiratory flora. Required urgent intubation overnight 3/4. Mental status improved and patient was successfully extubated 3/7. Initial plan wascontinue with IV antibiotics vancomycin and Unasyn through 3/17. Now comfort care. Staph epidermidis bacteremia.  Concern for MRSA bacteremia-ruled out -Patient developed persistent fever and worsening leukocytosis 3/9, this prompted obtaining repeat blood cultures and broadening IV antibiotics to include vancomycin. Repeat blood cultures resulted positive for staph epidermidis with methicillin resistance in aerobic bottle only on 3/11. Also echocardiogram Doppler negative for vegetation or infection.Off antibiotics now Hypertensive emergency associated with intracranial hemorrhage> now controlled -SBP on EMS arrival reportedly > 200. Echo 2/27 LVEF 70 to 75% and grade 1 diastolic dysfunction. Initially treated with Cleviprex infusion. Hypergylcemia -Controlled, has not been requiring insulin Acute anemia, likely due to acute illness Physical deconditioning Possible focal R seizure activity -Questionable focal RLE "twitching" noted 3/8PM. Neuro with recommendation for STAT EEG which was grossly unremarkable.EEG negative for seizure activity 3/9 Difficulty with core track -RN is having difficulty removing the core track. Not sure if there is any tissue embedment. Core track team will be consulted when available. Currently leaving the core track in, continue comfort. Concern for C-spine injury-ruled out Hypophosphatemia-corrected  Discharge Instructions   Allergies as of 07/05/2020   No Active Allergies     Medication List    TAKE these medications   acetaminophen 500 MG tablet Commonly known as: TYLENOL Take 500 mg by mouth in the  morning.   fluticasone 50 MCG/ACT nasal spray Commonly known as: FLONASE Place 1 spray into both nostrils daily.  Consultations:  PCCM  Neurology   ID  Palliative care  Procedures/Studies:  CT Code Stroke CTA Head W/WO contrast  Result Date: 06/16/2020 CLINICAL DATA:  Stroke/TIA, assess extracranial arteries; Neck trauma (Age >= 65y); Neuro deficit, acute, stroke suspected EXAM: CT ANGIOGRAPHY HEAD AND NECK TECHNIQUE: Multidetector CT imaging of the head and neck was performed using the standard protocol during bolus administration of intravenous contrast. Multiplanar CT image reconstructions and MIPs were obtained to evaluate the vascular anatomy. Carotid stenosis measurements (when applicable) are obtained utilizing NASCET criteria, using the distal internal carotid diameter as the denominator. Multidetector CT imaging of the cervical spine was performed without intravenous contrast. Multiplanar CT image reconstructions were also generated. CONTRAST:  75mL OMNIPAQUE IOHEXOL 350 MG/ML SOLN COMPARISON:  06/15/2020. FINDINGS: CT HEAD FINDINGS Brain: Redemonstration of right frontoparietal parenchymal hemorrhage, grossly unchanged in size measuring 7.7 x 4.4 cm. Slight differences in measurements likely secondary to slice selection. Sub 5 mm left temporal hyperdensity is also unchanged. Similar appearance of bilateral subarachnoid blood products. Partial effacement of the right lateral ventricle. 3 mm leftward midline shift is unchanged. Stable appearance of the ventricular system. No new focal hypodensity. Vascular: No hyperdense vessel or unexpected calcification. Skull: Negative for fracture or focal lesion. Sinuses/Orbits: Normal orbits. Mild maxillary sinus mucosal thickening. No mastoid effusion. Other: None. Review of the MIP images confirms the above findings CTA NECK FINDINGS Aortic arch: Aortic arch atherosclerotic calcifications. Standard branching. Patent great vessel origins.  Right carotid system: Patent. Carotid bifurcation atheromatous disease with less than 50% proximal ICA narrowing. Left carotid system: Patent. CCA atheromatous plaque with less than 50% narrowing. Bifurcation atheromatous disease with less than 50% narrowing. Vertebral arteries: Codominant. Mild left vertebral artery origin narrowing. Moderate to severe right V1 segment narrowing secondary to atherosclerotic plaque. Skeleton: No acute or suspicious osseous abnormalities. Other neck: No adenopathy.  No soft tissue mass. Upper chest: Biapical atelectasis and pleuroparenchymal scarring. Review of the MIP images confirms the above findings CTA HEAD FINDINGS Anterior circulation: Bilateral carotid siphon atherosclerotic calcifications. Patent ICAs mild right ICA terminus narrowing. Patent ophthalmic artery origins. Patent ACAs. Patent MCAs. Multifocal mild-to-moderate right M2 segment narrowing. Posterior circulation: Patent left V4 segment. Mild to moderate distal right V4 segment narrowing. Patent basilar artery with mild narrowing distally. Proximally patent superior cerebellar arteries. Patent right PCA. Mild to moderate right P2/P3 segment narrowing. Patent left PCA with moderate P1 and mild P2/P3 narrowing. Venous sinuses: As permitted by contrast timing, patent. Anatomic variants: None. Review of the MIP images confirms the above findings CT HEAD FINDINGS Alignment: Straightening of lordosis. Minimal grade 1 C3-4 retrolisthesis. Skull base and vertebrae: Vertebral body heights are preserved. No fracture or focal osseous lesion. Soft tissues and spinal canal: No prevertebral fluid or swelling. No visible canal hematoma. Disc levels: Multilevel spondylosis most prominent at the C3-6 levels. Other: None. IMPRESSION: Head CT: Stable appearance of 7.7 cm right frontoparietal hemorrhage. 2-3 mm leftward midline shift, unchanged. Sub 5 mm left temporal hemorrhage and bilateral subarachnoid blood products, unchanged. CTA  neck: No high-grade narrowing or large vessel occlusion within the neck. Less than 50% narrowing of the left CCA and bilateral proximal ICAs. CTA head: No large vessel occlusion. High-grade distal right V4 segment narrowing. Mild to moderate right M2, right ICA terminus, distal basilar artery and bilateral PCA narrowing. Cervical spine CT: No acute fracture or traumatic listhesis. Multilevel spondylosis most prominent at the C3-6 levels. Electronically Signed   By: Stana Bunting M.D.   On: 06/16/2020  10:21   CT Head Wo Contrast  Result Date: 06/21/2020 CLINICAL DATA:  Follow-up hemorrhagic stroke. EXAM: CT HEAD WITHOUT CONTRAST TECHNIQUE: Contiguous axial images were obtained from the base of the skull through the vertex without intravenous contrast. COMPARISON:  06/19/2020 FINDINGS: Brain: No unexpected evolutionary findings. The brainstem and cerebellum are normal. Left cerebral hemisphere shows chronic small-vessel ischemic change of the white matter. Intraparenchymal hemorrhage/hemorrhagic infarction right frontal and parietal region appears the same. No evidence of extension or increased bleeding. Mass-effect is the same with right-to-left shift of only 3-4 mm. No ventricular trapping. No sign of subarachnoid penetration. Vascular: There is atherosclerotic calcification of the major vessels at the base of the brain. Skull: Negative Sinuses/Orbits: Clear/normal Other: None IMPRESSION: No unexpected evolutionary findings. Intraparenchymal hemorrhage/hemorrhagic infarction right frontal and parietal region appears the same. Mass-effect with right-to-left shift of only 3-4 mm. No ventricular trapping. No sign of subarachnoid penetration by CT. Electronically Signed   By: Paulina Fusi M.D.   On: 06/21/2020 08:42   CT HEAD WO CONTRAST  Result Date: 06/19/2020 CLINICAL DATA:  Follow-up right frontal parietal hemorrhage EXAM: CT HEAD WITHOUT CONTRAST TECHNIQUE: Contiguous axial images were obtained from the  base of the skull through the vertex without intravenous contrast. COMPARISON:  06/16/2020 FINDINGS: Brain: There is again noted a right frontal parietal parenchymal hemorrhage seen. Mixed blood products are identified. The overall size is slightly larger than that seen on the prior exam now measuring 8.7 x 4.5 cm in greatest AP and transverse dimensions respectively. Surrounding edema is noted stable from the previous exam. Minimal midline shift from right to left is noted also stable from the prior exam. Small area of parenchymal hemorrhage is noted measuring 6 mm within the left temporal lobe. No new focal hemorrhage is seen. No sizable intraventricular or subdural component is noted. Vascular: No hyperdense vessel or unexpected calcification. Skull: Normal. Negative for fracture or focal lesion. Sinuses/Orbits: Mucosal thickening is noted within the maxillary sinuses bilaterally. No other focal abnormality is seen. Other: None IMPRESSION: Slight enlargement in the parenchymal hemorrhage in the right frontal parietal region as described. Minimal midline shift from right to left is noted. Stable small parenchymal hemorrhage in the left temporal lobe unchanged from the prior exam. Electronically Signed   By: Alcide Clever M.D.   On: 06/19/2020 08:14   CT Code Stroke CTA Neck W/WO contrast  Result Date: 06/16/2020 CLINICAL DATA:  Stroke/TIA, assess extracranial arteries; Neck trauma (Age >= 65y); Neuro deficit, acute, stroke suspected EXAM: CT ANGIOGRAPHY HEAD AND NECK TECHNIQUE: Multidetector CT imaging of the head and neck was performed using the standard protocol during bolus administration of intravenous contrast. Multiplanar CT image reconstructions and MIPs were obtained to evaluate the vascular anatomy. Carotid stenosis measurements (when applicable) are obtained utilizing NASCET criteria, using the distal internal carotid diameter as the denominator. Multidetector CT imaging of the cervical spine was  performed without intravenous contrast. Multiplanar CT image reconstructions were also generated. CONTRAST:  75mL OMNIPAQUE IOHEXOL 350 MG/ML SOLN COMPARISON:  06/15/2020. FINDINGS: CT HEAD FINDINGS Brain: Redemonstration of right frontoparietal parenchymal hemorrhage, grossly unchanged in size measuring 7.7 x 4.4 cm. Slight differences in measurements likely secondary to slice selection. Sub 5 mm left temporal hyperdensity is also unchanged. Similar appearance of bilateral subarachnoid blood products. Partial effacement of the right lateral ventricle. 3 mm leftward midline shift is unchanged. Stable appearance of the ventricular system. No new focal hypodensity. Vascular: No hyperdense vessel or unexpected calcification. Skull: Negative for fracture  or focal lesion. Sinuses/Orbits: Normal orbits. Mild maxillary sinus mucosal thickening. No mastoid effusion. Other: None. Review of the MIP images confirms the above findings CTA NECK FINDINGS Aortic arch: Aortic arch atherosclerotic calcifications. Standard branching. Patent great vessel origins. Right carotid system: Patent. Carotid bifurcation atheromatous disease with less than 50% proximal ICA narrowing. Left carotid system: Patent. CCA atheromatous plaque with less than 50% narrowing. Bifurcation atheromatous disease with less than 50% narrowing. Vertebral arteries: Codominant. Mild left vertebral artery origin narrowing. Moderate to severe right V1 segment narrowing secondary to atherosclerotic plaque. Skeleton: No acute or suspicious osseous abnormalities. Other neck: No adenopathy.  No soft tissue mass. Upper chest: Biapical atelectasis and pleuroparenchymal scarring. Review of the MIP images confirms the above findings CTA HEAD FINDINGS Anterior circulation: Bilateral carotid siphon atherosclerotic calcifications. Patent ICAs mild right ICA terminus narrowing. Patent ophthalmic artery origins. Patent ACAs. Patent MCAs. Multifocal mild-to-moderate right M2  segment narrowing. Posterior circulation: Patent left V4 segment. Mild to moderate distal right V4 segment narrowing. Patent basilar artery with mild narrowing distally. Proximally patent superior cerebellar arteries. Patent right PCA. Mild to moderate right P2/P3 segment narrowing. Patent left PCA with moderate P1 and mild P2/P3 narrowing. Venous sinuses: As permitted by contrast timing, patent. Anatomic variants: None. Review of the MIP images confirms the above findings CT HEAD FINDINGS Alignment: Straightening of lordosis. Minimal grade 1 C3-4 retrolisthesis. Skull base and vertebrae: Vertebral body heights are preserved. No fracture or focal osseous lesion. Soft tissues and spinal canal: No prevertebral fluid or swelling. No visible canal hematoma. Disc levels: Multilevel spondylosis most prominent at the C3-6 levels. Other: None. IMPRESSION: Head CT: Stable appearance of 7.7 cm right frontoparietal hemorrhage. 2-3 mm leftward midline shift, unchanged. Sub 5 mm left temporal hemorrhage and bilateral subarachnoid blood products, unchanged. CTA neck: No high-grade narrowing or large vessel occlusion within the neck. Less than 50% narrowing of the left CCA and bilateral proximal ICAs. CTA head: No large vessel occlusion. High-grade distal right V4 segment narrowing. Mild to moderate right M2, right ICA terminus, distal basilar artery and bilateral PCA narrowing. Cervical spine CT: No acute fracture or traumatic listhesis. Multilevel spondylosis most prominent at the C3-6 levels. Electronically Signed   By: Stana Bunting M.D.   On: 06/16/2020 10:21   MR BRAIN W WO CONTRAST  Result Date: 06/15/2020 CLINICAL DATA:  Follow-up stroke. Right frontoparietal parenchymal hemorrhage. Left temporal hemorrhage. EXAM: MRI HEAD WITHOUT AND WITH CONTRAST TECHNIQUE: Multiplanar, multiecho pulse sequences of the brain and surrounding structures were obtained without and with intravenous contrast. CONTRAST:  7.63mL  GADAVIST GADOBUTROL 1 MMOL/ML IV SOLN COMPARISON:  Head CT same day FINDINGS: Brain: No focal abnormality affects the brainstem or cerebellum. Cerebral hemispheres show innumerable foci of hemosiderin deposition scattered throughout both hemispheres, consistent with previous hemorrhagic infarctions. Intraparenchymal hematoma at the right frontoparietal junction shows internal clot retraction in layering, measuring 7.8 x 4.7 x 4.6 cm. There is no abnormal enhancement to suggest that this represents a hemorrhagic mass. Small hemorrhage in the left temporal lobe cannot be specifically identified by MRI as different than the other foci of hemosiderin deposition. Small amount of subarachnoid hemorrhage within the sulci seen on both sides. After contrast administration, no abnormal enhancement occurs elsewhere within the cerebral hemispheres. Mild mass-effect upon the right lateral ventricle with right-to-left shift 3 mm. Chronic small-vessel ischemic changes of the white matter seen elsewhere. The overall pattern could either be due to amyloid angiopathy or hypertensive vascular disease. Vascular: Major vessels at the  base of the brain show flow. Skull and upper cervical spine: Negative Sinuses/Orbits: Clear/normal Other: None IMPRESSION: 1. Innumerable foci of hemosiderin deposition scattered throughout the brain consistent with previous hemorrhagic infarctions. Intraparenchymal hematoma at the right frontoparietal junction with internal clot retraction, measuring 7.8 x 4.7 x 4.6 cm. No abnormal enhancement to suggest that this represents a hemorrhagic mass. Small hemorrhage in the left temporal lobe cannot be specifically identified by MRI as different than the other foci of hemosiderin deposition. 2. Small amount of subarachnoid hemorrhage within the sulci. 3. Mild mass-effect upon the right lateral ventricle with right-to-left shift 3 mm. Chronic small-vessel ischemic changes of the white matter elsewhere. The  overall pattern could either be due to amyloid angiopathy or hypertensive vascular disease. 4. No finding to suggest the presence metastatic disease. Electronically Signed   By: Paulina Fusi M.D.   On: 06/15/2020 22:56   CT C-SPINE NO CHARGE  Result Date: 06/16/2020 CLINICAL DATA:  Stroke/TIA, assess extracranial arteries; Neck trauma (Age >= 65y); Neuro deficit, acute, stroke suspected EXAM: CT ANGIOGRAPHY HEAD AND NECK TECHNIQUE: Multidetector CT imaging of the head and neck was performed using the standard protocol during bolus administration of intravenous contrast. Multiplanar CT image reconstructions and MIPs were obtained to evaluate the vascular anatomy. Carotid stenosis measurements (when applicable) are obtained utilizing NASCET criteria, using the distal internal carotid diameter as the denominator. Multidetector CT imaging of the cervical spine was performed without intravenous contrast. Multiplanar CT image reconstructions were also generated. CONTRAST:  75mL OMNIPAQUE IOHEXOL 350 MG/ML SOLN COMPARISON:  06/15/2020. FINDINGS: CT HEAD FINDINGS Brain: Redemonstration of right frontoparietal parenchymal hemorrhage, grossly unchanged in size measuring 7.7 x 4.4 cm. Slight differences in measurements likely secondary to slice selection. Sub 5 mm left temporal hyperdensity is also unchanged. Similar appearance of bilateral subarachnoid blood products. Partial effacement of the right lateral ventricle. 3 mm leftward midline shift is unchanged. Stable appearance of the ventricular system. No new focal hypodensity. Vascular: No hyperdense vessel or unexpected calcification. Skull: Negative for fracture or focal lesion. Sinuses/Orbits: Normal orbits. Mild maxillary sinus mucosal thickening. No mastoid effusion. Other: None. Review of the MIP images confirms the above findings CTA NECK FINDINGS Aortic arch: Aortic arch atherosclerotic calcifications. Standard branching. Patent great vessel origins. Right  carotid system: Patent. Carotid bifurcation atheromatous disease with less than 50% proximal ICA narrowing. Left carotid system: Patent. CCA atheromatous plaque with less than 50% narrowing. Bifurcation atheromatous disease with less than 50% narrowing. Vertebral arteries: Codominant. Mild left vertebral artery origin narrowing. Moderate to severe right V1 segment narrowing secondary to atherosclerotic plaque. Skeleton: No acute or suspicious osseous abnormalities. Other neck: No adenopathy.  No soft tissue mass. Upper chest: Biapical atelectasis and pleuroparenchymal scarring. Review of the MIP images confirms the above findings CTA HEAD FINDINGS Anterior circulation: Bilateral carotid siphon atherosclerotic calcifications. Patent ICAs mild right ICA terminus narrowing. Patent ophthalmic artery origins. Patent ACAs. Patent MCAs. Multifocal mild-to-moderate right M2 segment narrowing. Posterior circulation: Patent left V4 segment. Mild to moderate distal right V4 segment narrowing. Patent basilar artery with mild narrowing distally. Proximally patent superior cerebellar arteries. Patent right PCA. Mild to moderate right P2/P3 segment narrowing. Patent left PCA with moderate P1 and mild P2/P3 narrowing. Venous sinuses: As permitted by contrast timing, patent. Anatomic variants: None. Review of the MIP images confirms the above findings CT HEAD FINDINGS Alignment: Straightening of lordosis. Minimal grade 1 C3-4 retrolisthesis. Skull base and vertebrae: Vertebral body heights are preserved. No fracture or focal osseous lesion.  Soft tissues and spinal canal: No prevertebral fluid or swelling. No visible canal hematoma. Disc levels: Multilevel spondylosis most prominent at the C3-6 levels. Other: None. IMPRESSION: Head CT: Stable appearance of 7.7 cm right frontoparietal hemorrhage. 2-3 mm leftward midline shift, unchanged. Sub 5 mm left temporal hemorrhage and bilateral subarachnoid blood products, unchanged. CTA neck:  No high-grade narrowing or large vessel occlusion within the neck. Less than 50% narrowing of the left CCA and bilateral proximal ICAs. CTA head: No large vessel occlusion. High-grade distal right V4 segment narrowing. Mild to moderate right M2, right ICA terminus, distal basilar artery and bilateral PCA narrowing. Cervical spine CT: No acute fracture or traumatic listhesis. Multilevel spondylosis most prominent at the C3-6 levels. Electronically Signed   By: Stana Bunting M.D.   On: 06/16/2020 10:21   DG CHEST PORT 1 VIEW  Result Date: 07/01/2020 CLINICAL DATA:  Fever EXAM: PORTABLE CHEST 1 VIEW COMPARISON:  June 26, 2020 FINDINGS: Enteric tube tip is below the diaphragm. Apparent atelectasis left lower lobe persists. Small granuloma lateral left base is stable. Lungs elsewhere clear. Heart is borderline prominent, stable, with pulmonary vascularity within normal limits. No adenopathy. There is aortic atherosclerosis. No bone lesions. IMPRESSION: Atelectasis left base. A mild degree of superimposed pneumonia in the left base cannot be excluded. Appearance similar to recent study. Small granuloma lateral left base. Lungs elsewhere clear. Stable cardiac prominence. Enteric tube tip below diaphragm. Electronically Signed   By: Bretta Bang III M.D.   On: 07/01/2020 09:47   DG CHEST PORT 1 VIEW  Result Date: 06/26/2020 CLINICAL DATA:  Aspiration pneumonia. EXAM: PORTABLE CHEST 1 VIEW COMPARISON:  June 24, 2020. FINDINGS: Right perihilar opacity is resolved, but there is slightly increased left basilar opacities. No visible pleural effusions or pneumothorax on this semi erect portable radiograph. Borderline enlargement the cardiac silhouette. Central pulmonary vascular congestion. Atherosclerosis of the aorta. Gastric tube courses below the diaphragm in outside the field of view. Endotracheal tube is not visualized, presumably extubated. IMPRESSION: 1. Right perihilar opacity is resolved, but there is  slightly increased left basilar opacities which may represent atelectasis, aspiration, and/or pneumonia. 2. Borderline cardiomegaly and central pulmonary vascular congestion. Electronically Signed   By: Feliberto Harts MD   On: 06/26/2020 10:48   DG CHEST PORT 1 VIEW  Result Date: 06/24/2020 CLINICAL DATA:  Evaluate ETT. EXAM: PORTABLE CHEST 1 VIEW COMPARISON:  June 21, 2020 FINDINGS: The ETT is in good position. The right PICC line terminates in the central SVC. The feeding tube terminates either in the distal stomach or proximal duodenum. The left lung is clear. Right perihilar opacity has improved but subtle opacity may remain. No other acute abnormalities. IMPRESSION: 1. The distal feeding tube is either in the proximal duodenum or distal stomach. Recommend repositioning before use. 2. The infiltrate in the right perihilar region has improved but mild opacity likely persists. 3. Other support apparatus in good position as above. Electronically Signed   By: Gerome Sam III M.D   On: 06/24/2020 08:55   DG CHEST PORT 1 VIEW  Result Date: 06/21/2020 CLINICAL DATA:  Respiratory failure EXAM: PORTABLE CHEST 1 VIEW COMPARISON:  06/21/2020 FINDINGS: Endotracheal tube is seen 5.7 cm above the carina. Nasoenteric feeding tube extends into the upper abdomen beyond the margin of the examination. Pulmonary insufflation is stable and the lungs are symmetrically well expanded. Ground-glass infiltrate throughout the right lung and within the left perihilar region has improved most in keeping with noncardiogenic pulmonary edema  given its relatively rapid improved. Right upper extremity PICC line tip noted within the superior vena cava. No pneumothorax or pleural effusion. Cardiac size within normal limits. IMPRESSION: Endotracheal tube in appropriate position. Otherwise stable support lines and tubes. Interval improvement in asymmetric perihilar pulmonary infiltrate, most in keeping with noncardiogenic pulmonary  edema. Electronically Signed   By: Helyn Numbers MD   On: 06/21/2020 23:35   DG CHEST PORT 1 VIEW  Result Date: 06/21/2020 CLINICAL DATA:  Respiratory abnormality EXAM: PORTABLE CHEST 1 VIEW COMPARISON:  06/20/2020 FINDINGS: Progression of bibasilar airspace disease. Possible pneumonia. Heart size not enlarged. Vascularity normal. Right arm PICC tip in the lower SVC Feeding tube enters the stomach with the tip not visualized. IMPRESSION: Progressive bilateral airspace disease right greater than left. Probable pneumonia Electronically Signed   By: Marlan Palau M.D.   On: 06/21/2020 14:52   DG CHEST PORT 1 VIEW  Result Date: 06/20/2020 CLINICAL DATA:  Aspiration pneumonia EXAM: PORTABLE CHEST 1 VIEW COMPARISON:  06/18/2020 FINDINGS: Right upper extremity PICC line tip noted within the superior vena cava. Nasoenteric feeding tube extends into the upper abdomen beyond the margin of the examination. Pulmonary insufflation has improved and lung volumes are normal. Superimposed perihilar interstitial pulmonary infiltrates appear stable when accounting for improved pulmonary insufflation. Cardiac size within normal limits. No pneumothorax or pleural effusion. IMPRESSION: Improved pulmonary insufflation. Stable trace perihilar pulmonary infiltrates. Stable support lines and tubes. Electronically Signed   By: Helyn Numbers MD   On: 06/20/2020 04:52   DG CHEST PORT 1 VIEW  Result Date: 06/18/2020 CLINICAL DATA:  Acute intracranial hemorrhage and concern for aspiration pneumonia. EXAM: PORTABLE CHEST 1 VIEW COMPARISON:  06/16/2020 FINDINGS: Stable heart size. Interval placement of feeding tube that extends below the diaphragm into the stomach. New bibasilar opacities may be consistent with atelectasis but could also be consistent with aspiration pneumonia given clinical history. No overt edema, pleural fluid or pneumothorax. IMPRESSION: 1. New bibasilar opacities may be consistent with atelectasis but could also  be consistent with aspiration pneumonia given clinical history. 2. Feeding tube extends below the diaphragm into the stomach. Electronically Signed   By: Irish Lack M.D.   On: 06/18/2020 12:57   DG CHEST PORT 1 VIEW  Result Date: 06/16/2020 CLINICAL DATA:  Fever, history code stroke. Elevated blood pressure and tachycardic. EXAM: PORTABLE CHEST 1 VIEW COMPARISON:  Chest x-ray 06/15/2020 FINDINGS: The heart size and mediastinal contours are unchanged. Unchanged aortic arch calcifications. Redemonstration of prominent hilar vasculature. Biapical pleural/pulmonary scarring. Left base pulmonary nodule likely represents a granuloma. No focal consolidation. No pulmonary edema. No pleural effusion. No pneumothorax. No acute osseous abnormality. IMPRESSION: No active disease. Electronically Signed   By: Tish Frederickson M.D.   On: 06/16/2020 17:18   DG CHEST PORT 1 VIEW  Result Date: 06/15/2020 CLINICAL DATA:  Unresponsive. EXAM: PORTABLE CHEST 1 VIEW COMPARISON:  None. FINDINGS: The heart size and mediastinal contours are within normal limits. Both lungs are clear. The visualized skeletal structures are unremarkable. IMPRESSION: No active disease. Electronically Signed   By: Lupita Raider M.D.   On: 06/15/2020 20:29   EEG adult  Result Date: 06/26/2020 Charlsie Quest, MD     06/26/2020  9:42 PM Patient Name: Avrey Hyser MRN: [id number] Epilepsy Attending: Charlsie Quest Referring Physician/Provider: Dr. Milon Dikes Date: 06/26/2020 Duration: 25.31 mins  Patient history: 86 year old male with right MCA infarct.  EEG to evaluate for seizures.  Level of alertness: Awake  AEDs during EEG study: None  Technical aspects: This EEG study was done with scalp electrodes positioned according to the 10-20 International system of electrode placement. Electrical activity was acquired at a sampling rate of 500Hz  and reviewed with a high frequency filter of 70Hz  and a low frequency filter of 1Hz . EEG data were  recorded continuously and digitally stored.  Description: The posterior dominant rhythm consists of 8 Hz activity of moderate voltage (25-35 uV) seen predominantly in posterior head regions, symmetric and reactive to eye opening and eye closing.  EEG showed continuous 3 to 6 Hz theta-delta slowing in right frontotemporal region. Hyperventilation and photic stimulation were not performed.    ABNORMALITY -Continuous slow, right frontotemporal region  IMPRESSION: This study is suggestive of cortical dysfunction in right frontotemporal region likely secondary to underlying stroke.  No seizures or epileptiform discharges were seen throughout the recording.    EEG adult  Result Date: 06/21/2020 , MD     06/21/2020  1:48 PM Patient Name: Kylee Nardozzi MRN: Charlsie Quest Epilepsy Attending: 08/21/2020 Referring Physician/Provider: Dr. Albin Fischer Date: 06/21/2020 Duration: 21.29 mins Patient history: 86 year old male with right MCA infarct.  EEG to evaluate for seizures. Level of alertness: Awake, asleep AEDs during EEG study: None Technical aspects: This EEG study was done with scalp electrodes positioned according to the 10-20 International system of electrode placement. Electrical activity was acquired at a sampling rate of 500Hz  and reviewed with a high frequency filter of 70Hz  and a low frequency filter of 1Hz . EEG data were recorded continuously and digitally stored. Description: The posterior dominant rhythm consists of 8 Hz activity of moderate voltage (25-35 uV) seen predominantly in posterior head regions, symmetric and reactive to eye opening and eye closing. Sleep was characterized by vertex waves, sleep spindles (12 to 14 Hz), maximal frontocentral region. EEG showed continuous 3 to 6 Hz theta-delta slowing in right frontotemporal region. Hyperventilation and photic stimulation were not performed.   ABNORMALITY -Continuous slow, right frontotemporal region IMPRESSION:  This study is suggestive of cortical dysfunction in right frontotemporal region likely secondary to underlying stroke.  No seizures or epileptiform discharges were seen throughout the recording. Priyanka Delia Heady   Overnight EEG with video  Result Date: 06/27/2020 97, MD     06/27/2020 12:22 PM Patient Name:Damontae Mrk Buzby Epilepsy Attending:Priyanka Annabelle Harman Referring Physician/Provider:Dr. 08/27/2020 Duration:06/26/2020 1927 to 06/27/2020 1020  Patient history:86 year old male with right MCA infarct. EEG to evaluate for seizures.  Level of alertness:Awake, asleep  AEDs during EEG study:None  Technical aspects: This EEG study was done with scalp electrodes positioned according to the 10-20 International system of electrode placement. Electrical activity was acquired at a sampling rate of 500Hz  and reviewed with a high frequency filter of 70Hz  and a low frequency filter of 1Hz . EEG data were recorded continuously and digitally stored.  Description: The posterior dominant rhythm consists of8Hz  activity of moderate voltage (25-35 uV) seen predominantly in posterior head regions, symmetric and reactive to eye opening and eye closing.  Sleep was characterized by sleep spindles (12 to 14 Hz), maximal frontocentral region.  EEG showed continuous 3 to 6 Hz theta-delta slowing in right frontotemporal region. ABNORMALITY -Continuous slow, right frontotemporal region  IMPRESSION: This study issuggestive of cortical dysfunction in right frontotemporal region likely secondary to underlying stroke.No seizures or epileptiform discharges were seen throughout the recording.  FAO:[id number]   ECHOCARDIOGRAM COMPLETE  Result Date: 06/30/2020  ECHOCARDIOGRAM REPORT   Patient Name:   GILDO CRISCO Date of Exam: 06/30/2020 Medical Rec #:  [id number]       Height:       68.0 in Accession #:    [id number]      Weight:       177.5 lb Date of Birth:  03/31/1935       BSA:           1.943 m Patient Age:    86 years        BP:           117/91 mmHg Patient Gender: M               HR:           80 bpm. Exam Location:  Inpatient Procedure: 2D Echo, Cardiac Doppler and Color Doppler Indications:    Bacteremia  History:        Patient has prior history of Echocardiogram examinations, most                 recent 06/17/2020. Signs/Symptoms:Bacteremia; Risk                 Factors:Hypertension and Dyslipidemia. Right front hemorrhage.  Sonographer:    Lavenia Atlas Referring Phys: [id number] [facility] M PATEL  Sonographer Comments: Patient unable to cooperate IMPRESSIONS  1. Left ventricular ejection fraction, by estimation, is 60 to 65%. The left ventricle has normal function. The left ventricle has no regional wall motion abnormalities. There is mild left ventricular hypertrophy. Left ventricular diastolic parameters are consistent with Grade I diastolic dysfunction (impaired relaxation).  2. Right ventricular systolic function is normal. The right ventricular size is normal. There is normal pulmonary artery systolic pressure. The estimated right ventricular systolic pressure is 33.9 mmHg.  3. The mitral valve is normal in structure. Trivial mitral valve regurgitation. No evidence of mitral stenosis.  4. The aortic valve is grossly normal. There is mild calcification of the aortic valve. Aortic valve regurgitation is trivial. No aortic stenosis is present.  5. The inferior vena cava is normal in size with greater than 50% respiratory variability, suggesting right atrial pressure of 3 mmHg. Conclusion(s)/Recommendation(s): No evidence of valvular vegetations on this transthoracic echocardiogram. FINDINGS  Left Ventricle: Left ventricular ejection fraction, by estimation, is 60 to 65%. The left ventricle has normal function. The left ventricle has no regional wall motion abnormalities. The left ventricular internal cavity size was normal in size. There is  mild left ventricular hypertrophy. Left  ventricular diastolic parameters are consistent with Grade I diastolic dysfunction (impaired relaxation). Right Ventricle: The right ventricular size is normal. No increase in right ventricular wall thickness. Right ventricular systolic function is normal. There is normal pulmonary artery systolic pressure. The tricuspid regurgitant velocity is 2.78 m/s, and  with an assumed right atrial pressure of 3 mmHg, the estimated right ventricular systolic pressure is 33.9 mmHg. Left Atrium: Left atrial size was normal in size. Right Atrium: Right atrial size was normal in size. Pericardium: There is no evidence of pericardial effusion. Mitral Valve: The mitral valve is normal in structure. Mild mitral annular calcification. Trivial mitral valve regurgitation. No evidence of mitral valve stenosis. Tricuspid Valve: The tricuspid valve is normal in structure. Tricuspid valve regurgitation is not demonstrated. No evidence of tricuspid stenosis. Aortic Valve: The aortic valve is grossly normal. There is mild calcification of the aortic valve. Aortic valve regurgitation is trivial. No aortic stenosis is present. Aortic valve peak gradient  measures 11.6 mmHg. Pulmonic Valve: The pulmonic valve was normal in structure. Pulmonic valve regurgitation is trivial. No evidence of pulmonic stenosis. Aorta: The aortic root is normal in size and structure. Venous: The inferior vena cava is normal in size with greater than 50% respiratory variability, suggesting right atrial pressure of 3 mmHg. IAS/Shunts: No atrial level shunt detected by color flow Doppler.  LEFT VENTRICLE PLAX 2D LVIDd:         4.40 cm  Diastology LVIDs:         2.70 cm  LV e' medial:    5.00 cm/s LV PW:         1.30 cm  LV E/e' medial:  18.5 LV IVS:        1.20 cm  LV e' lateral:   6.64 cm/s LVOT diam:     2.40 cm  LV E/e' lateral: 13.9 LV SV:         122 LV SV Index:   63 LVOT Area:     4.52 cm  RIGHT VENTRICLE RV Basal diam:  2.50 cm RV S prime:     18.60 cm/s TAPSE  (M-mode): 2.6 cm LEFT ATRIUM           Index       RIGHT ATRIUM           Index LA diam:      3.40 cm 1.75 cm/m  RA Area:     13.70 cm LA Vol (A2C): 44.2 ml 22.75 ml/m RA Volume:   28.20 ml  14.52 ml/m LA Vol (A4C): 49.5 ml 25.48 ml/m  AORTIC VALVE AV Area (Vmax): 3.51 cm AV Vmax:        170.00 cm/s AV Peak Grad:   11.6 mmHg LVOT Vmax:      132.00 cm/s LVOT Vmean:     78.500 cm/s LVOT VTI:       0.270 m  AORTA Ao Root diam: 3.10 cm MITRAL VALVE                TRICUSPID VALVE MV Area (PHT): 2.60 cm     TR Peak grad:   30.9 mmHg MV Decel Time: 292 msec     TR Vmax:        278.00 cm/s MV E velocity: 92.40 cm/s MV A velocity: 118.00 cm/s  SHUNTS MV E/A ratio:  0.78         Systemic VTI:  0.27 m                             Systemic Diam: 2.40 cm Weston Brass MD Electronically signed by Weston Brass MD Signature Date/Time: 06/30/2020/4:12:57 PM    Final    ECHOCARDIOGRAM COMPLETE  Result Date: 06/17/2020    ECHOCARDIOGRAM REPORT   Patient Name:   DANZIG MACGREGOR Date of Exam: 06/17/2020 Medical Rec #:  [id number]       Height:       68.0 in Accession #:    [id number]      Weight:       186.1 lb Date of Birth:  03-May-1934       BSA:          1.982 m Patient Age:    86 years        BP:           119/47 mmHg Patient Gender: M  HR:           100 bpm. Exam Location:  Inpatient Procedure: 2D Echo, Cardiac Doppler and Color Doppler Indications:    Stroke I63.9  History:        Patient has no prior history of Echocardiogram examinations.  Sonographer:    Renella Cunas RDCS Referring Phys: [id number] [facility]  Sonographer Comments: Image acquisition challenging due to respiratory motion. IMPRESSIONS  1. Left ventricular ejection fraction, by estimation, is 70 to 75%. The left ventricle has hyperdynamic function. The left ventricle has no regional wall motion abnormalities. Left ventricular diastolic parameters are consistent with Grade I diastolic dysfunction (impaired relaxation).  2. Right ventricular  systolic function is normal. The right ventricular size is normal. Tricuspid regurgitation signal is inadequate for assessing PA pressure.  3. The mitral valve is normal in structure. Trivial mitral valve regurgitation. No evidence of mitral stenosis.  4. The aortic valve is tricuspid. Aortic valve regurgitation is not visualized. Mild aortic valve sclerosis is present, with no evidence of aortic valve stenosis.  5. The inferior vena cava is normal in size with greater than 50% respiratory variability, suggesting right atrial pressure of 3 mmHg. FINDINGS  Left Ventricle: Left ventricular ejection fraction, by estimation, is 70 to 75%. The left ventricle has hyperdynamic function. The left ventricle has no regional wall motion abnormalities. The left ventricular internal cavity size was normal in size. There is no left ventricular hypertrophy. Left ventricular diastolic parameters are consistent with Grade I diastolic dysfunction (impaired relaxation). Right Ventricle: The right ventricular size is normal.Right ventricular systolic function is normal. Tricuspid regurgitation signal is inadequate for assessing PA pressure. The tricuspid regurgitant velocity is 2.30 m/s, and with an assumed right atrial pressure of 3 mmHg, the estimated right ventricular systolic pressure is 24.2 mmHg. Left Atrium: Left atrial size was normal in size. Right Atrium: Right atrial size was normal in size. Pericardium: There is no evidence of pericardial effusion. Mitral Valve: The mitral valve is normal in structure. Trivial mitral valve regurgitation. No evidence of mitral valve stenosis. Tricuspid Valve: The tricuspid valve is normal in structure. Tricuspid valve regurgitation is trivial. No evidence of tricuspid stenosis. Aortic Valve: The aortic valve is tricuspid. Aortic valve regurgitation is not visualized. Mild aortic valve sclerosis is present, with no evidence of aortic valve stenosis. Pulmonic Valve: The pulmonic valve was not  well visualized. Pulmonic valve regurgitation is not visualized. No evidence of pulmonic stenosis. Aorta: The aortic root is normal in size and structure. Venous: The inferior vena cava is normal in size with greater than 50% respiratory variability, suggesting right atrial pressure of 3 mmHg.  LEFT VENTRICLE PLAX 2D LVIDd:         4.60 cm      Diastology LVIDs:         3.00 cm      LV e' medial:    7.29 cm/s LV PW:         1.00 cm      LV E/e' medial:  14.4 LV IVS:        0.80 cm      LV e' lateral:   8.92 cm/s LVOT diam:     2.20 cm      LV E/e' lateral: 11.8 LV SV:         83 LV SV Index:   42 LVOT Area:     3.80 cm  LV Volumes (MOD) LV vol d, MOD A2C: 95.9 ml LV vol d, MOD  A4C: 119.0 ml LV vol s, MOD A2C: 34.8 ml LV vol s, MOD A4C: 49.8 ml LV SV MOD A2C:     61.1 ml LV SV MOD A4C:     119.0 ml LV SV MOD BP:      67.1 ml RIGHT VENTRICLE RV S prime:     18.80 cm/s TAPSE (M-mode): 2.7 cm LEFT ATRIUM             Index       RIGHT ATRIUM           Index LA diam:        3.90 cm 1.97 cm/m  RA Area:     17.20 cm LA Vol (A2C):   45.7 ml 23.06 ml/m RA Volume:   51.60 ml  26.03 ml/m LA Vol (A4C):   40.4 ml 20.38 ml/m LA Biplane Vol: 44.6 ml 22.50 ml/m  AORTIC VALVE LVOT Vmax:   119.00 cm/s LVOT Vmean:  79.500 cm/s LVOT VTI:    0.218 m  AORTA Ao Root diam: 3.60 cm MITRAL VALVE                TRICUSPID VALVE MV Area (PHT): 4.41 cm     TR Peak grad:   21.2 mmHg MV Decel Time: 172 msec     TR Vmax:        230.00 cm/s MV E velocity: 105.00 cm/s MV A velocity: 128.00 cm/s  SHUNTS MV E/A ratio:  0.82         Systemic VTI:  0.22 m                             Systemic Diam: 2.20 cm Olga Millers MD Electronically signed by Olga Millers MD Signature Date/Time: 06/17/2020/10:51:35 AM    Final    CT HEAD CODE STROKE WO CONTRAST  Result Date: 06/15/2020 CLINICAL DATA:  Code stroke.  Neuro deficit, acute, stroke suspected EXAM: CT HEAD WITHOUT CONTRAST TECHNIQUE: Contiguous axial images were obtained from the base of the  skull through the vertex without intravenous contrast. COMPARISON:  None. FINDINGS: Brain: Right frontoparietal intraparenchymal hemorrhage measuring 7.6 x 4.7 x 3.9 cm with peripheral vasogenic edema. Sub 5 mm left temporal hemorrhage. Partial effacement of the right lateral ventricle. No evidence of entrapment. Subarachnoid blood products also opacify the right greater than left sulci. No significant midline shift. Vascular: No hyperdense vessel or unexpected calcification. Bilateral carotid siphon atherosclerotic calcifications. Skull: Negative for fracture or focal lesion. Sinuses/Orbits: Normal orbits. Mild paranasal sinus mucosal thickening. No mastoid effusion. Other: None. ASPECTS [facility] Stroke Program Early CT Score) - Ganglionic level infarction (caudate, lentiform nuclei, internal capsule, insula, M1-M3 cortex): 7 - Supraganglionic infarction (M4-M6 cortex): 3 Total score (0-10 with 10 being normal): 10 IMPRESSION: 1. Right frontoparietal parenchymal hemorrhage measuring 7.6 cm. Sub-5 mm left temporal hemorrhage. 2. No significant midline shift or ventricular entrapment. 3. Right greater than left cerebral convexity subarachnoid blood products. 4. ASPECTS is 10. Code stroke imaging results were communicated on 06/15/2020 at 7:50 pm to provider Bhagat via secure text paging. Electronically Signed   By: Stana Bunting M.D.   On: 06/15/2020 19:53   VAS [place] LOWER EXTREMITY VENOUS (DVT)  Result Date: 06/28/2020  Lower Venous DVT Study Indications: Swelling, and Edema.  Comparison Study: no prior Performing Technologist: [name] RVS  Examination Guidelines: A complete evaluation includes B-mode imaging, spectral Doppler, color Doppler, and power Doppler as needed of all  accessible portions of each vessel. Bilateral testing is considered an integral part of a complete examination. Limited examinations for reoccurring indications may be performed as noted. The reflux portion of the exam is performed  with the patient in reverse Trendelenburg.  +---------+---------------+---------+-----------+----------+--------------+ RIGHT    CompressibilityPhasicitySpontaneityPropertiesThrombus Aging +---------+---------------+---------+-----------+----------+--------------+ CFV      Full           Yes      Yes                                 +---------+---------------+---------+-----------+----------+--------------+ SFJ      Full                                                        +---------+---------------+---------+-----------+----------+--------------+ FV Prox  Full                                                        +---------+---------------+---------+-----------+----------+--------------+ FV Mid   Full                                                        +---------+---------------+---------+-----------+----------+--------------+ FV DistalFull                                                        +---------+---------------+---------+-----------+----------+--------------+ PFV      Full                                                        +---------+---------------+---------+-----------+----------+--------------+ POP      Full           Yes      Yes                                 +---------+---------------+---------+-----------+----------+--------------+ PTV      Full                                                        +---------+---------------+---------+-----------+----------+--------------+ PERO     Full                                                        +---------+---------------+---------+-----------+----------+--------------+   +---------+---------------+---------+-----------+----------+--------------+ LEFT  CompressibilityPhasicitySpontaneityPropertiesThrombus Aging +---------+---------------+---------+-----------+----------+--------------+ CFV      Full           Yes      Yes                                  +---------+---------------+---------+-----------+----------+--------------+ SFJ      Full                                                        +---------+---------------+---------+-----------+----------+--------------+ FV Prox  Full                                                        +---------+---------------+---------+-----------+----------+--------------+ FV Mid   Full                                                        +---------+---------------+---------+-----------+----------+--------------+ FV DistalFull                                                        +---------+---------------+---------+-----------+----------+--------------+ PFV      Full                                                        +---------+---------------+---------+-----------+----------+--------------+ POP      Full           Yes      Yes                                 +---------+---------------+---------+-----------+----------+--------------+ PTV      Full                                                        +---------+---------------+---------+-----------+----------+--------------+ PERO     Full                                                        +---------+---------------+---------+-----------+----------+--------------+     Summary: BILATERAL: - No evidence of deep vein thrombosis seen in the lower extremities, bilaterally. - No evidence of superficial venous thrombosis in the lower extremities, bilaterally. -No evidence of popliteal cyst, bilaterally.   *See table(s) above for measurements and observations. Electronically signed by Heath Lark on  06/28/2020 at 3:44:39 PM.    Final    VAS [place] UPPER EXTREMITY VENOUS DUPLEX  Result Date: 06/28/2020 UPPER VENOUS STUDY  Indications: Edema, and Swelling Comparison Study: no prior Performing Technologist: [name] RVS  Examination Guidelines: A complete evaluation includes B-mode imaging, spectral Doppler, color  Doppler, and power Doppler as needed of all accessible portions of each vessel. Bilateral testing is considered an integral part of a complete examination. Limited examinations for reoccurring indications may be performed as noted.  Left Findings: +----------+------------+---------+-----------+----------+-------+ LEFT      CompressiblePhasicitySpontaneousPropertiesSummary +----------+------------+---------+-----------+----------+-------+ IJV           Full       Yes       Yes                      +----------+------------+---------+-----------+----------+-------+ Subclavian    Full       Yes       Yes                      +----------+------------+---------+-----------+----------+-------+ Axillary      Full       Yes       Yes                      +----------+------------+---------+-----------+----------+-------+ Brachial      Full       Yes       Yes                      +----------+------------+---------+-----------+----------+-------+ Radial        Full                                          +----------+------------+---------+-----------+----------+-------+ Ulnar         Full                                          +----------+------------+---------+-----------+----------+-------+ Cephalic      Full                                          +----------+------------+---------+-----------+----------+-------+ Basilic       Full                                          +----------+------------+---------+-----------+----------+-------+  Summary:  Left: No evidence of deep vein thrombosis in the upper extremity. No evidence of superficial vein thrombosis in the upper extremity. No evidence of thrombosis in the subclavian.  *See table(s) above for measurements and observations.  Diagnosing physician: Heath Lark Electronically signed by Heath Lark on 06/28/2020 at 3:44:49 PM.    Final    [place] EKG SITE RITE  Result Date: 06/19/2020 If Site Rite image not  attached, placement could not be confirmed due to current cardiac rhythm.     Subjective: confused  Discharge Exam: BP (!) 155/73 (BP Location: Left Arm)   Pulse 89   Temp 98.4 F (36.9 C) (Oral)   Resp 18   Ht 5\' 8"  (1.727 m)  Wt 83.2 kg   SpO2 97%   BMI 27.89 kg/m   General: Pt is alert, awake, not in acute distress   The results of significant diagnostics from this hospitalization (including imaging, microbiology, ancillary and laboratory) are listed below for reference.     Microbiology: Recent Results (from the past 240 hour(s))  Culture, blood (routine x 2)     Status: Abnormal   Collection Time: 06/27/20 10:52 AM   Specimen: BLOOD LEFT HAND  Result Value Ref Range Status   Specimen Description BLOOD LEFT HAND  Final   Special Requests   Final    BOTTLES DRAWN AEROBIC ONLY Blood Culture adequate volume   Culture  Setup Time   Final    GRAM POSITIVE COCCI IN CLUSTERS AEROBIC BOTTLE ONLY CRITICAL RESULT CALLED TO, READ BACK BY AND VERIFIED WITH: Ihor Austin PharmD 12:20 06/28/20 ([email or web address]) Performed at [facility], [address], [place], [place] [address]    Culture STAPHYLOCOCCUS EPIDERMIDIS (A)  Final   Report Status 06/30/2020 FINAL  Final   Organism ID, Bacteria STAPHYLOCOCCUS EPIDERMIDIS  Final      Susceptibility   Staphylococcus epidermidis - MIC*    CIPROFLOXACIN >=8 RESISTANT Resistant     ERYTHROMYCIN >=8 RESISTANT Resistant     GENTAMICIN <=0.5 SENSITIVE Sensitive     OXACILLIN >=4 RESISTANT Resistant     TETRACYCLINE 4 SENSITIVE Sensitive     VANCOMYCIN <=0.5 SENSITIVE Sensitive     TRIMETH/SULFA 80 RESISTANT Resistant     CLINDAMYCIN >=8 RESISTANT Resistant     RIFAMPIN <=0.5 SENSITIVE Sensitive     Inducible Clindamycin NEGATIVE Sensitive     * STAPHYLOCOCCUS EPIDERMIDIS  Culture, blood (routine x 2)     Status: Abnormal   Collection Time: 06/27/20 10:52 AM   Specimen: BLOOD LEFT HAND  Result Value Ref Range Status   Specimen  Description BLOOD LEFT HAND  Final   Special Requests   Final    BOTTLES DRAWN AEROBIC ONLY Blood Culture adequate volume   Culture  Setup Time   Final    GRAM POSITIVE COCCI AEROBIC BOTTLE ONLY CRITICAL VALUE NOTED.  VALUE IS CONSISTENT WITH PREVIOUSLY REPORTED AND CALLED VALUE.    Culture (A)  Final    STAPHYLOCOCCUS EPIDERMIDIS SUSCEPTIBILITIES PERFORMED ON PREVIOUS CULTURE WITHIN THE LAST 5 DAYS. Performed at [facility], [address] [place], [place] [address]    Report Status 06/30/2020 FINAL  Final  Blood Culture ID Panel (Reflexed)     Status: Abnormal   Collection Time: 06/27/20 10:52 AM  Result Value Ref Range Status   Enterococcus faecalis NOT DETECTED NOT DETECTED Final   Enterococcus Faecium NOT DETECTED NOT DETECTED Final   Listeria monocytogenes NOT DETECTED NOT DETECTED Final   Staphylococcus species DETECTED (A) NOT DETECTED Final    Comment: CRITICAL RESULT CALLED TO, READ BACK BY AND VERIFIED WITH: Ihor Austin PharmD 12:20 06/28/20 ([email or web address])    Staphylococcus aureus (BCID) NOT DETECTED NOT DETECTED Final   Staphylococcus epidermidis DETECTED (A) NOT DETECTED Final    Comment: Methicillin (oxacillin) resistant coagulase negative staphylococcus. Possible blood culture contaminant (unless isolated from more than one blood culture draw or clinical case suggests pathogenicity). No antibiotic treatment is indicated for blood  culture contaminants. CRITICAL RESULT CALLED TO, READ BACK BY AND VERIFIED WITH: Ihor Austin PharmD 12:20 06/28/20 (wilsonm)    Staphylococcus lugdunensis NOT DETECTED NOT DETECTED Final   Streptococcus species NOT DETECTED NOT DETECTED Final   Streptococcus agalactiae NOT  DETECTED NOT DETECTED Final   Streptococcus pneumoniae NOT DETECTED NOT DETECTED Final   Streptococcus pyogenes NOT DETECTED NOT DETECTED Final   A.calcoaceticus-baumannii NOT DETECTED NOT DETECTED Final   Bacteroides fragilis NOT DETECTED NOT DETECTED Final    Enterobacterales NOT DETECTED NOT DETECTED Final   Enterobacter cloacae complex NOT DETECTED NOT DETECTED Final   Escherichia coli NOT DETECTED NOT DETECTED Final   Klebsiella aerogenes NOT DETECTED NOT DETECTED Final   Klebsiella oxytoca NOT DETECTED NOT DETECTED Final   Klebsiella pneumoniae NOT DETECTED NOT DETECTED Final   Proteus species NOT DETECTED NOT DETECTED Final   Salmonella species NOT DETECTED NOT DETECTED Final   Serratia marcescens NOT DETECTED NOT DETECTED Final   Haemophilus influenzae NOT DETECTED NOT DETECTED Final   Neisseria meningitidis NOT DETECTED NOT DETECTED Final   Pseudomonas aeruginosa NOT DETECTED NOT DETECTED Final   Stenotrophomonas maltophilia NOT DETECTED NOT DETECTED Final   Candida albicans NOT DETECTED NOT DETECTED Final   Candida auris NOT DETECTED NOT DETECTED Final   Candida glabrata NOT DETECTED NOT DETECTED Final   Candida krusei NOT DETECTED NOT DETECTED Final   Candida parapsilosis NOT DETECTED NOT DETECTED Final   Candida tropicalis NOT DETECTED NOT DETECTED Final   Cryptococcus neoformans/gattii NOT DETECTED NOT DETECTED Final   Methicillin resistance mecA/C DETECTED (A) NOT DETECTED Final    Comment: CRITICAL RESULT CALLED TO, READ BACK BY AND VERIFIED WITH: Ihor Austin PharmD 12:20 06/28/20 (wilsonm) Performed at [facility], [address], [place], [place] [address]   Culture, blood (routine x 2)     Status: None   Collection Time: 06/30/20 12:07 PM   Specimen: BLOOD  Result Value Ref Range Status   Specimen Description BLOOD SITE NOT SPECIFIED  Final   Special Requests   Final    BOTTLES DRAWN AEROBIC ONLY Blood Culture results may not be optimal due to an inadequate volume of blood received in culture bottles   Culture   Final    NO GROWTH 5 DAYS Performed at [facility], [address], [place], [place] [address]    Report Status 07/05/2020 FINAL  Final  Culture, blood (routine x 2)     Status: None   Collection  Time: 06/30/20 12:07 PM   Specimen: BLOOD  Result Value Ref Range Status   Specimen Description BLOOD SITE NOT SPECIFIED  Final   Special Requests   Final    BOTTLES DRAWN AEROBIC ONLY Blood Culture results may not be optimal due to an inadequate volume of blood received in culture bottles   Culture   Final    NO GROWTH 5 DAYS Performed at [facility], [address], [place], [place] [address]    Report Status 07/05/2020 FINAL  Final     Labs: Basic Metabolic Panel: Recent Labs  Lab 06/29/20 0108 06/30/20 1207 07/01/20 0412 07/02/20 0232 07/03/20 0131  NA 142 139 137 137 141  K 4.0 4.3 4.1 4.1 4.5  CL 114* 112* 111 112* 112*  CO2 21* 19* 22 22 24   GLUCOSE 146* 163* 145* 154* 141*  BUN 32* 28* 28* 30* 30*  CREATININE 0.96 0.74 0.86 0.88 0.88  CALCIUM 7.6* 7.7* 7.6* 7.6* 7.8*  MG  --  2.3 2.5* 2.5* 2.4  PHOS  --  2.8  --   --   --    Liver Function Tests: Recent Labs  Lab 06/30/20 1207  AST 26  ALT 44  ALKPHOS 52  BILITOT 0.6  PROT 5.8*  ALBUMIN 2.4*   CBC: Recent Labs  Lab 06/29/20 0108 06/30/20 1207 07/01/20 0412 07/02/20 0232 07/03/20 0131  WBC 18.5* 20.9* 18.2* 18.4* 17.2*  NEUTROABS  --  17.5*  --   --   --   HGB 10.3* 10.8* 10.7* 10.5* 10.5*  HCT 30.8* 32.9* 31.2* 31.0* 31.9*  MCV 97.5 98.5 96.9 97.2 97.3  PLT 232 263 288 298 302   CBG: Recent Labs  Lab 07/02/20 2017 07/02/20 2320 07/03/20 0335 07/03/20 0750 07/03/20 1205  GLUCAP 128* 123* 140* 143* 146*   Hgb A1c No results for input(s): HGBA1C in the last 72 hours. Lipid Profile No results for input(s): CHOL, HDL, LDLCALC, TRIG, CHOLHDL, LDLDIRECT in the last 72 hours. Thyroid function studies No results for input(s): TSH, T4TOTAL, T3FREE, THYROIDAB in the last 72 hours.  Invalid input(s): FREET3 Urinalysis    Component Value Date/Time   COLORURINE YELLOW 06/30/2020 1806   APPEARANCEUR HAZY (A) 06/30/2020 1806   LABSPEC 1.018 06/30/2020 1806   PHURINE 6.0 06/30/2020 1806    GLUCOSEU NEGATIVE 06/30/2020 1806   HGBUR NEGATIVE 06/30/2020 1806   BILIRUBINUR NEGATIVE 06/30/2020 1806   KETONESUR NEGATIVE 06/30/2020 1806   PROTEINUR NEGATIVE 06/30/2020 1806   NITRITE NEGATIVE 06/30/2020 1806   LEUKOCYTESUR NEGATIVE 06/30/2020 1806    FURTHER DISCHARGE INSTRUCTIONS:   Get Medicines reviewed and adjusted: Please take all your medications with you for your next visit with your Primary MD   Laboratory/radiological data: Please request your Primary MD to go over all hospital tests and procedure/radiological results at the follow up, please ask your Primary MD to get all Hospital records sent to his/her office.   In some cases, they will be blood work, cultures and biopsy results pending at the time of your discharge. Please request that your primary care M.D. goes through all the records of your hospital data and follows up on these results.   Also Note the following: If you experience worsening of your admission symptoms, develop shortness of breath, life threatening emergency, suicidal or homicidal thoughts you must seek medical attention immediately by calling 911 or calling your MD immediately  if symptoms less severe.   You must read complete instructions/literature along with all the possible adverse reactions/side effects for all the Medicines you take and that have been prescribed to you. Take any new Medicines after you have completely understood and accpet all the possible adverse reactions/side effects.    Do not drive when taking Pain medications or sleeping medications (Benzodaizepines)   Do not take more than prescribed Pain, Sleep and Anxiety Medications. It is not advisable to combine anxiety,sleep and pain medications without talking with your primary care practitioner   Special Instructions: If you have smoked or chewed Tobacco  in the last 2 yrs please stop smoking, stop any regular Alcohol  and or any Recreational drug use.   Wear Seat belts  while driving.   Please note: You were cared for by a hospitalist during your hospital stay. Once you are discharged, your primary care physician will handle any further medical issues. Please note that NO REFILLS for any discharge medications will be authorized once you are discharged, as it is imperative that you return to your primary care physician (or establish a relationship with a primary care physician if you do not have one) for your post hospital discharge needs so that they can reassess your need for medications and monitor your lab values.  Time coordinating discharge:  25 minutes  SIGNED:  Pamella Pert, MD, PhD 07/05/2020, 9:47 AM

## 2020-07-05 NOTE — Progress Notes (Signed)
Pt has been discharged from the unit via [facility]. All belongings has been sent with the pt. AVS has been given to receiving facility and pt sent in good spirits.

## 2020-07-05 NOTE — Progress Notes (Signed)
Civil engineer, contracting [facility]) Hospital Liaison note.    Received request from [facility] manager Chase Picket, CSW for family interest in [facility]. Chart reviewed and eligibility confirmed.   Message left for daughter to return call. Hospital liaison will reach out again tomorrow morning.    A Please do not hesitate to call with questions.    Thank you,   Elsie Saas, RN, [facility]      [facility] Hospital Liaison (listed on [facility] under [facility]ospice [facility])    [phone]

## 2020-07-05 NOTE — Progress Notes (Signed)
Civil engineer, contracting [facility]) Hospital Liaison note.     Spoke with family to confirm interest and explain services. Family agreeable to transfer today. TOC aware.    Registration paperwork has been completed at this time.  Please arrange transport.   RN,  please call report to [phone].  Please be sure a signed [place] DNR form transports with the patient.  Thank you for the opportunity to participate in this patient's care.  Gillian Scarce, BSN, RN [facility] Liaison (listed on [place] under Hospice[facility])    [phone] (24h on call)

## 2020-07-05 NOTE — TOC Transition Note (Signed)
Transition of Care [facility]) - CM/SW Discharge Note   Patient Details  Name: Calvin Yates MRN: [id number] Date of Birth: [birth year]  Transition of Care [facility]) CM/SW Contact:  Baldemar Lenis, LCSW Phone Number: 07/05/2020, 1:53 PM   Clinical Narrative:   Nurse to call report to [phone].    Final next level of care: Hospice Medical Facility Barriers to Discharge: Barriers Resolved   Patient Goals and CMS Choice Patient states their goals for this hospitalization and ongoing recovery are:: patient unable to participate in goal setting, not fully oriented CMS Medicare.gov Compare Post Acute Care list provided to:: Patient Represenative (must comment) Choice offered to / list presented to : Adult Children  Discharge Placement                Patient to be transferred to facility by: PTAR Name of family member notified: Trish Patient and family notified of of transfer: 07/05/20  Discharge Plan and Services     Post Acute Care Choice: Hospice                               Social Determinants of Health (SDOH) Interventions     Readmission Risk Interventions No flowsheet data found.

## 2020-07-10 LAB — VITAMIN B1: Vitamin B1 (Thiamine): 214.7 nmol/L — ABNORMAL HIGH (ref 66.5–200.0)

## 2020-07-20 DEATH — deceased

## 2022-08-18 IMAGING — DX DG CHEST 1V PORT
1 series · 1 of 1 positions shown · non-contrast
Comparison: None.

CLINICAL DATA: Unresponsive.

EXAM:
PORTABLE CHEST 1 VIEW

[chest ap]
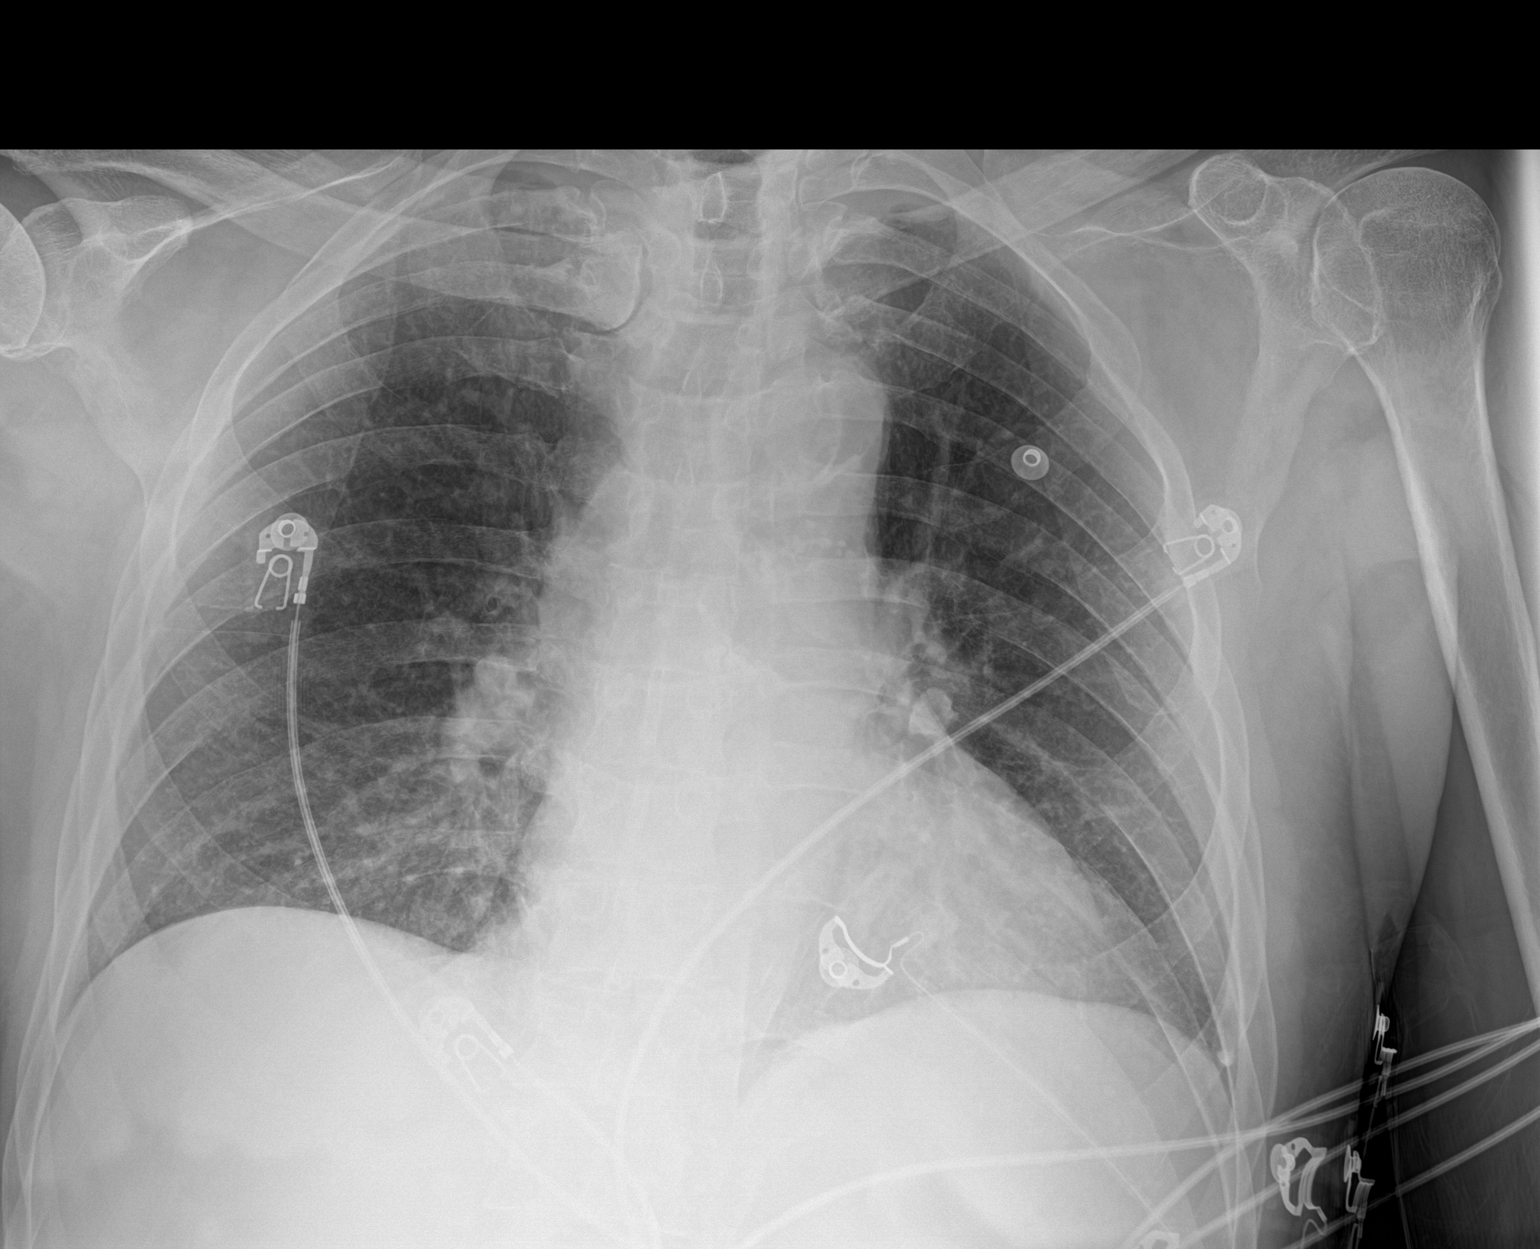

[1 of 1 positions shown; findings below may reference images not displayed]

FINDINGS: The heart size and mediastinal contours are within normal limits.
Both lungs are clear. The visualized skeletal structures are
unremarkable.
IMPRESSION: No active disease.

## 2022-08-19 IMAGING — DX DG CHEST 1V PORT
1 series · 1 of 1 positions shown · non-contrast
Comparison: Chest x-ray 06/15/2020

CLINICAL DATA: Fever, history code stroke. Elevated blood pressure
and tachycardic.

EXAM:
PORTABLE CHEST 1 VIEW

[chest]
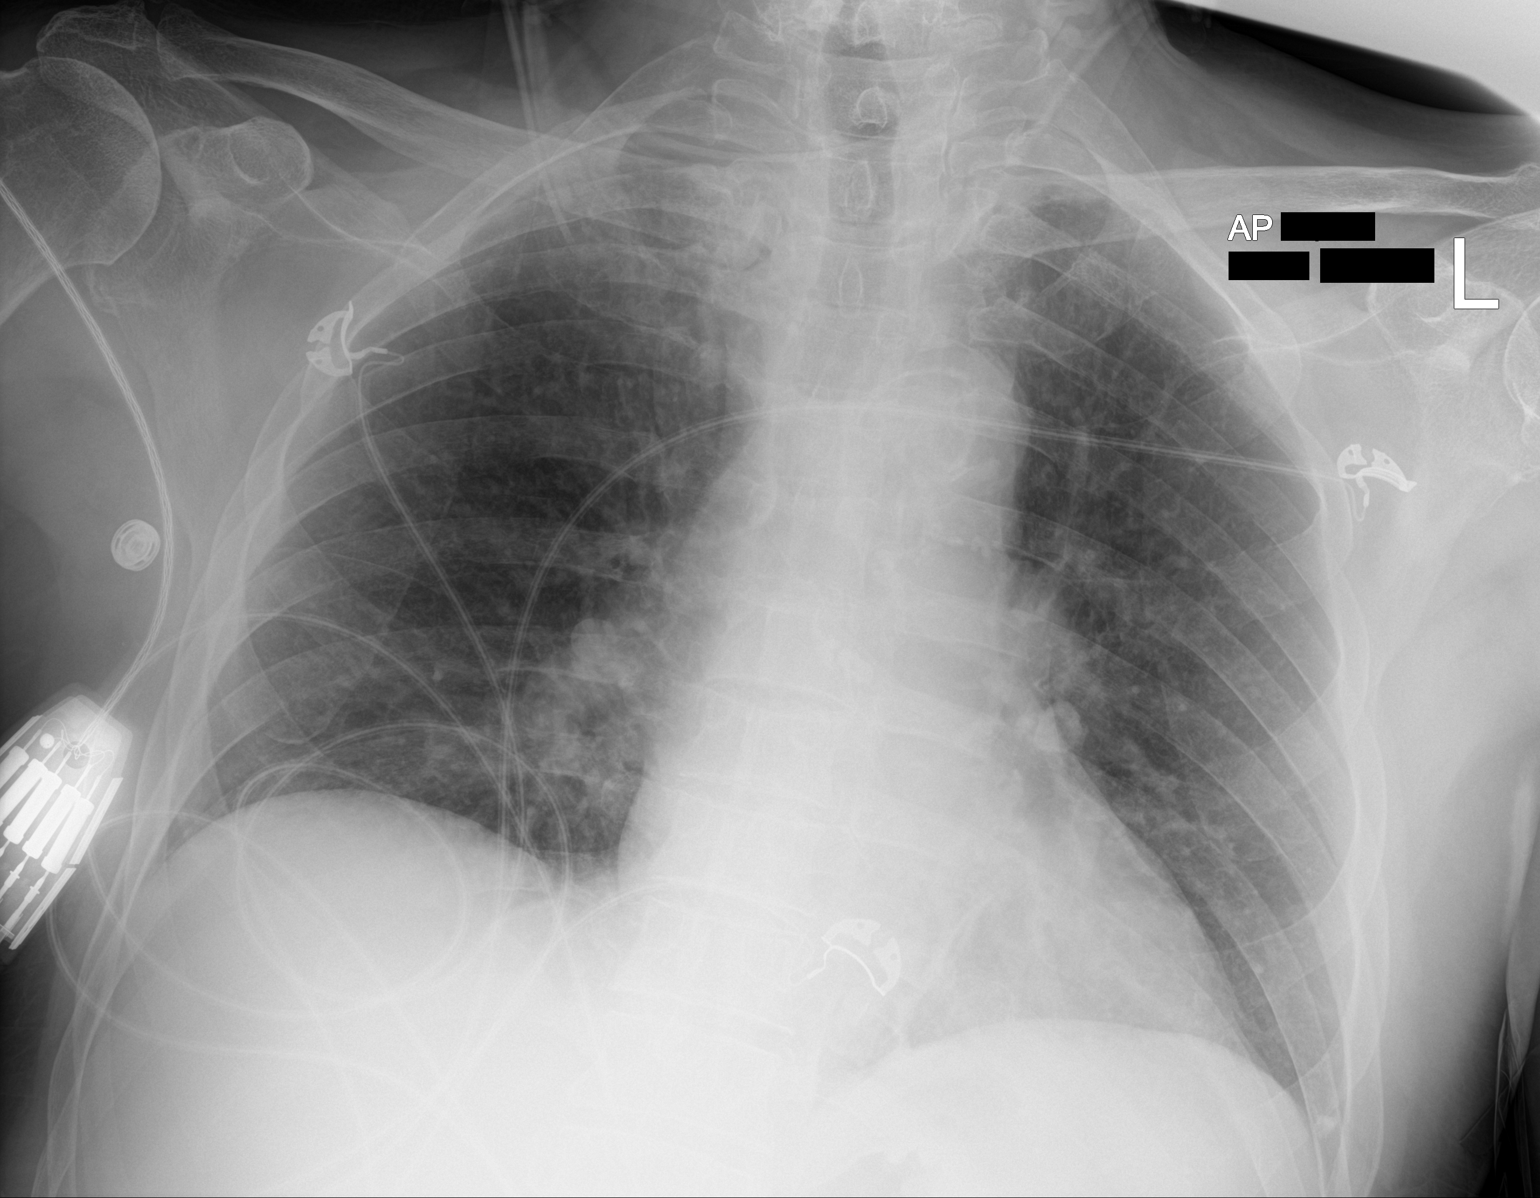

[1 of 1 positions shown; findings below may reference images not displayed]

FINDINGS: The heart size and mediastinal contours are unchanged. Unchanged
aortic arch calcifications. Redemonstration of prominent hilar
vasculature.

Biapical pleural/pulmonary scarring. Left base pulmonary nodule
likely represents a granuloma. No focal consolidation. No pulmonary
edema. No pleural effusion. No pneumothorax.

No acute osseous abnormality.
IMPRESSION: No active disease.

## 2022-08-21 IMAGING — DX DG CHEST 1V PORT
1 series · 1 of 1 positions shown · non-contrast
Comparison: 06/16/2020

CLINICAL DATA: Acute intracranial hemorrhage and concern for
aspiration pneumonia.

EXAM:
PORTABLE CHEST 1 VIEW

[chest ap]
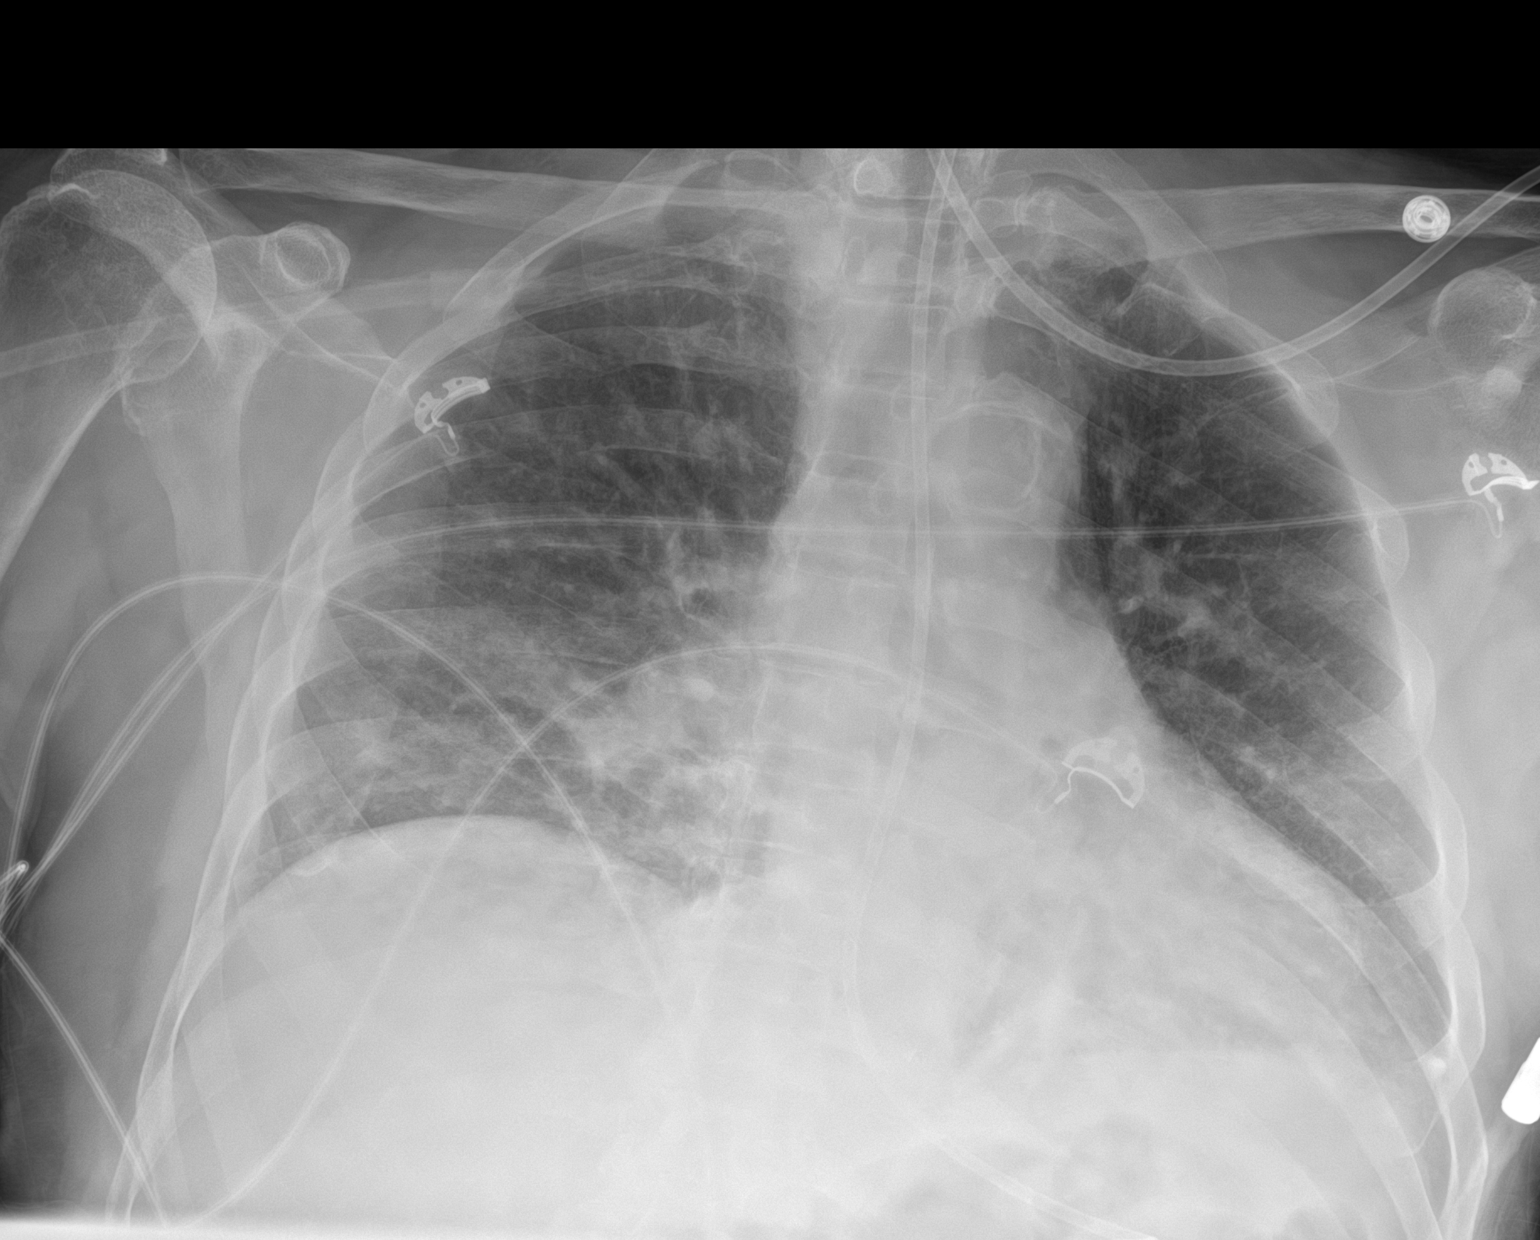

[1 of 1 positions shown; findings below may reference images not displayed]

FINDINGS: Stable heart size. Interval placement of feeding tube that extends
below the diaphragm into the stomach. New bibasilar opacities may be
consistent with atelectasis but could also be consistent with
aspiration pneumonia given clinical history. No overt edema, pleural
fluid or pneumothorax.
IMPRESSION: 1. New bibasilar opacities may be consistent with atelectasis but
could also be consistent with aspiration pneumonia given clinical
history.
2. Feeding tube extends below the diaphragm into the stomach.
